# Patient Record
Sex: Male | Born: 1950 | Race: White | Hispanic: No | Smoking: Former smoker
Health system: Southern US, Community
[De-identification: ages and names within clinical notes are randomized; demographics above are authoritative.]

## PROBLEM LIST (undated history)

## (undated) DIAGNOSIS — I10 Essential (primary) hypertension: Secondary | ICD-10-CM

## (undated) DIAGNOSIS — E785 Hyperlipidemia, unspecified: Secondary | ICD-10-CM

## (undated) DIAGNOSIS — C801 Malignant (primary) neoplasm, unspecified: Secondary | ICD-10-CM

## (undated) HISTORY — PX: SKIN CANCER EXCISION: SHX779

## (undated) HISTORY — PX: OTHER SURGICAL HISTORY: SHX169

## (undated) HISTORY — DX: Hyperlipidemia, unspecified: E78.5

## (undated) HISTORY — DX: Essential (primary) hypertension: I10

## (undated) HISTORY — DX: Malignant (primary) neoplasm, unspecified: C80.1

---

## 2002-04-24 ENCOUNTER — Encounter: Admission: RE | Admit: 2002-04-24 | Discharge: 2002-07-23 | Payer: Self-pay | Admitting: Internal Medicine

## 2009-01-08 ENCOUNTER — Ambulatory Visit: Payer: Self-pay | Admitting: Internal Medicine

## 2009-04-18 ENCOUNTER — Ambulatory Visit: Payer: Self-pay | Admitting: Internal Medicine

## 2009-06-25 ENCOUNTER — Ambulatory Visit: Payer: Self-pay | Admitting: Internal Medicine

## 2009-06-27 ENCOUNTER — Ambulatory Visit: Payer: Self-pay | Admitting: Internal Medicine

## 2009-08-22 ENCOUNTER — Ambulatory Visit: Payer: Self-pay | Admitting: Internal Medicine

## 2009-11-07 ENCOUNTER — Ambulatory Visit: Payer: Self-pay | Admitting: Internal Medicine

## 2010-03-18 ENCOUNTER — Ambulatory Visit: Payer: Self-pay | Admitting: Internal Medicine

## 2010-04-29 ENCOUNTER — Ambulatory Visit: Payer: Self-pay | Admitting: Internal Medicine

## 2010-08-01 ENCOUNTER — Ambulatory Visit: Payer: Self-pay | Admitting: Internal Medicine

## 2011-03-19 ENCOUNTER — Ambulatory Visit (INDEPENDENT_AMBULATORY_CARE_PROVIDER_SITE_OTHER): Payer: PRIVATE HEALTH INSURANCE | Admitting: Internal Medicine

## 2011-03-19 DIAGNOSIS — E119 Type 2 diabetes mellitus without complications: Secondary | ICD-10-CM

## 2011-03-19 DIAGNOSIS — E785 Hyperlipidemia, unspecified: Secondary | ICD-10-CM

## 2011-06-19 ENCOUNTER — Telehealth: Payer: Self-pay | Admitting: Internal Medicine

## 2011-06-19 NOTE — Telephone Encounter (Signed)
Written Rx  For Xanax 0.5mg  #30 1-2 tabs 2 hours before trip no refill will be faxed to pharmacy

## 2011-09-10 ENCOUNTER — Other Ambulatory Visit: Payer: Self-pay | Admitting: Internal Medicine

## 2011-09-15 ENCOUNTER — Other Ambulatory Visit: Payer: Self-pay | Admitting: Internal Medicine

## 2011-11-17 ENCOUNTER — Other Ambulatory Visit: Payer: Self-pay | Admitting: Internal Medicine

## 2011-11-18 ENCOUNTER — Other Ambulatory Visit: Payer: Self-pay

## 2011-11-18 MED ORDER — ATORVASTATIN CALCIUM 20 MG PO TABS: 40.0000 mg | ORAL_TABLET | ORAL | Status: DC

## 2011-11-18 NOTE — Telephone Encounter (Signed)
Spoke with patient today about scheduling an appointment with Dr. Lenord Fellers, but he informed me that he just got fired from his job and has only enough insurance to cover 90 day supply of medications. Generic Lipitor 40 mg one po daily #90 sent to pharmacy

## 2015-08-12 ENCOUNTER — Ambulatory Visit (INDEPENDENT_AMBULATORY_CARE_PROVIDER_SITE_OTHER): Payer: No Typology Code available for payment source | Admitting: Internal Medicine

## 2015-08-12 ENCOUNTER — Encounter: Payer: Self-pay | Admitting: Internal Medicine

## 2015-08-12 VITALS — BP 118/58 | HR 80 | Temp 97.9°F | Ht 69.0 in | Wt 133.0 lb

## 2015-08-12 DIAGNOSIS — IMO0002 Reserved for concepts with insufficient information to code with codable children: Secondary | ICD-10-CM

## 2015-08-12 DIAGNOSIS — Z0000 Encounter for general adult medical examination without abnormal findings: Secondary | ICD-10-CM

## 2015-08-12 DIAGNOSIS — Z794 Long term (current) use of insulin: Secondary | ICD-10-CM

## 2015-08-12 DIAGNOSIS — E1165 Type 2 diabetes mellitus with hyperglycemia: Secondary | ICD-10-CM

## 2015-08-12 DIAGNOSIS — R011 Cardiac murmur, unspecified: Secondary | ICD-10-CM

## 2015-08-12 DIAGNOSIS — Z125 Encounter for screening for malignant neoplasm of prostate: Secondary | ICD-10-CM

## 2015-08-12 DIAGNOSIS — R110 Nausea: Secondary | ICD-10-CM

## 2015-08-12 DIAGNOSIS — E785 Hyperlipidemia, unspecified: Secondary | ICD-10-CM | POA: Diagnosis not present

## 2015-08-12 LAB — POCT URINALYSIS DIPSTICK
Bilirubin, UA: NEGATIVE
Blood, UA: NEGATIVE
LEUKOCYTES UA: NEGATIVE
Nitrite, UA: NEGATIVE
PH UA: 7
PROTEIN UA: NEGATIVE
SPEC GRAV UA: 1.02
UROBILINOGEN UA: NEGATIVE

## 2015-08-12 LAB — COMPLETE METABOLIC PANEL WITH GFR
ALT: 12 U/L (ref 9–46)
AST: 11 U/L (ref 10–35)
Albumin: 4 g/dL (ref 3.6–5.1)
Alkaline Phosphatase: 84 U/L (ref 40–115)
BUN: 15 mg/dL (ref 7–25)
CALCIUM: 9 mg/dL (ref 8.6–10.3)
CHLORIDE: 101 mmol/L (ref 98–110)
CO2: 21 mmol/L (ref 20–31)
CREATININE: 1.12 mg/dL (ref 0.70–1.25)
GFR, EST AFRICAN AMERICAN: 81 mL/min (ref >=60)
GFR, Est Non African American: 70 mL/min (ref >=60)
Glucose, Bld: 198 mg/dL — ABNORMAL HIGH (ref 65–99)
POTASSIUM: 4.1 mmol/L (ref 3.5–5.3)
Sodium: 138 mmol/L (ref 135–146)
Total Bilirubin: 0.4 mg/dL (ref 0.2–1.2)
Total Protein: 6.7 g/dL (ref 6.1–8.1)

## 2015-08-12 LAB — HEMOGLOBIN A1C
HEMOGLOBIN A1C: 12.4 % — AB (ref <5.7)
Mean Plasma Glucose: 309 mg/dL — ABNORMAL HIGH (ref <117)

## 2015-08-12 LAB — CBC WITH DIFFERENTIAL/PLATELET
BASOS PCT: 1 % (ref 0–1)
Basophils Absolute: 0.1 10*3/uL (ref 0.0–0.1)
EOS PCT: 3 % (ref 0–5)
Eosinophils Absolute: 0.3 10*3/uL (ref 0.0–0.7)
HCT: 43.5 % (ref 39.0–52.0)
Hemoglobin: 14.8 g/dL (ref 13.0–17.0)
Lymphocytes Relative: 31 % (ref 12–46)
Lymphs Abs: 2.8 10*3/uL (ref 0.7–4.0)
MCH: 29.9 pg (ref 26.0–34.0)
MCHC: 34 g/dL (ref 30.0–36.0)
MCV: 87.9 fL (ref 78.0–100.0)
MONO ABS: 0.5 10*3/uL (ref 0.1–1.0)
MPV: 11.8 fL (ref 8.6–12.4)
Monocytes Relative: 6 % (ref 3–12)
Neutro Abs: 5.3 10*3/uL (ref 1.7–7.7)
Neutrophils Relative %: 59 % (ref 43–77)
Platelets: 282 10*3/uL (ref 150–400)
RBC: 4.95 MIL/uL (ref 4.22–5.81)
RDW: 14 % (ref 11.5–15.5)
WBC: 8.9 10*3/uL (ref 4.0–10.5)

## 2015-08-12 NOTE — Patient Instructions (Addendum)
Need to have diabetic eye exam. Patient is to call insurance company and get reassigned under my name. Previously was assigned to [facility]. Needs evaluation of cardiac murmur with 2-D echocardiogram and chest x-ray which have to have prior authorization under his insurance.  Increase Levemir to 20 units at bedtime and NovoLog to 15 units before meals. He must eat 3 meals daily not just 2 meals daily. Return in 8 weeks. Will need fasting lipid panel at that time.

## 2015-08-13 ENCOUNTER — Telehealth: Payer: Self-pay | Admitting: *Deleted

## 2015-08-13 ENCOUNTER — Telehealth: Payer: Self-pay | Admitting: Internal Medicine

## 2015-08-13 ENCOUNTER — Encounter: Payer: Self-pay | Admitting: Internal Medicine

## 2015-08-13 LAB — MICROALBUMIN / CREATININE URINE RATIO
Creatinine, Urine: 213.4 mg/dL
MICROALB UR: 2.7 mg/dL — AB (ref <2.0)
Microalb Creat Ratio: 12.7 mg/g (ref 0.0–30.0)

## 2015-08-13 LAB — PSA: PSA: 0.28 ng/mL (ref <=4.00)

## 2015-08-13 NOTE — Telephone Encounter (Signed)
Spoke with patient reviewed lab results and Insulin dose increase patient verbalized understanding. Patient schedule for 8 week follow up with labs.

## 2015-08-13 NOTE — Telephone Encounter (Signed)
Spoke with Herbie Baltimore at [facility] [phone]); POS plan eff 12/21/2014.  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  Because patient lives in [place], they drive this plan and the search for his providers off of his address in [place].  Therefore, Dr. Renold Genta is NOT allowed to be his PCP of choice.  She can see him.  She is on the list of providers for [place].  However, she is NOT a tier 1 provider for this patient, she is a tier 2 provider for this patient.  Patient would have to pay Tier 2 benefits.  We are paid IN NETWORK per Herbie Baltimore, but patient has to pay Tier 2.  Patient has a $2200 OOP.  He would require authorization for x-rays and OP procedures that are done OUTSIDE of the office.  For PRE-CERT, call #[phone].  Patient was present for this phone call, so patient is aware of this information and has been informed that he can continue to see Dr. Renold Genta, but he will have to pay Tier 2 benefits.  Otherwise, he would have to select someone in the [place] or on the list of Tier 1 providers and Aurea Graff can send that list to the patient.  Patient wishes to continue to see Dr. Renold Genta.  Ref #[id number] (for this phone call).    Dr. Renold Genta wants a chest x-ray and echocardiogram ordered for patient.  Dx:  Systolic cardiac murmur.  We'll start with chest x-ray which will require pre-cert per my call to [facility] this evening.  We will begin to work on that on Tuesday.  Patient is to also have fasting Lipid panel at that time as well.

## 2015-08-13 NOTE — Telephone Encounter (Signed)
Called for authorization for chest x-ray; spoke with Carla Drape. @ [phone], option 1.  Per Thurman Coyer, no pre-cert for the plan per the NIA [facility]).  I advised that I was told by Kalman Drape) on 8/22) that his plan WOULD require pre-cert and given her # to call.  She again said per the NIA, no auth req'd.  She couldn't provide a reference # for the call even though I asked for it.  Advised for all other pre-cert's to call #[phone].  Told her that I didn't want the member to have issues with payment following the chest x-ray.

## 2015-08-15 ENCOUNTER — Other Ambulatory Visit: Payer: No Typology Code available for payment source | Admitting: Internal Medicine

## 2015-08-15 ENCOUNTER — Ambulatory Visit
Admission: RE | Admit: 2015-08-15 | Discharge: 2015-08-15 | Disposition: A | Discharge status: Home / Self Care | Payer: No Typology Code available for payment source | Source: Ambulatory Visit | Attending: Internal Medicine | Admitting: Internal Medicine

## 2015-08-15 DIAGNOSIS — R011 Cardiac murmur, unspecified: Secondary | ICD-10-CM

## 2015-08-15 DIAGNOSIS — Z13220 Encounter for screening for lipoid disorders: Secondary | ICD-10-CM

## 2015-08-15 LAB — LIPID PANEL
CHOLESTEROL: 256 mg/dL — AB (ref 125–200)
HDL: 52 mg/dL (ref >=40)
LDL Cholesterol: 180 mg/dL — ABNORMAL HIGH (ref <130)
Total CHOL/HDL Ratio: 4.9 Ratio (ref <=5.0)
Triglycerides: 120 mg/dL (ref <150)
VLDL: 24 mg/dL (ref <30)

## 2015-08-16 ENCOUNTER — Telehealth: Payer: Self-pay | Admitting: Internal Medicine

## 2015-08-16 NOTE — Telephone Encounter (Signed)
LMOM for patient to call us to go over his Lipid Panel results with him.  Leaving results on Suzanne's desk.  Dr. Renold Genta want's patient to start on Zocor 10mg  daily with supper.  Patient will need to provide a pharmacy for [place] to phone this medication in for him.

## 2015-08-19 ENCOUNTER — Encounter: Payer: Self-pay | Admitting: *Deleted

## 2015-08-19 MED ORDER — ROSUVASTATIN CALCIUM 10 MG PO TABS: 10.0000 mg | ORAL_TABLET | Freq: Every day | ORAL | Status: DC

## 2015-08-19 NOTE — Telephone Encounter (Signed)
Spoke to patient's wife they use [facility] . Called in Crestor 10mg  per Dr Renold Genta due to insurance coverage.
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
## 2015-09-23 ENCOUNTER — Telehealth: Payer: Self-pay | Admitting: *Deleted

## 2015-09-23 DIAGNOSIS — R011 Cardiac murmur, unspecified: Secondary | ICD-10-CM

## 2015-09-25 NOTE — Telephone Encounter (Signed)
Patient has been scheduled for a 2D Echo at  [place] location on Oct 18,2016 @10 :30 am. Left information on patient voice mail as directed by patient.

## 2015-10-01 ENCOUNTER — Ambulatory Visit (INDEPENDENT_AMBULATORY_CARE_PROVIDER_SITE_OTHER): Payer: No Typology Code available for payment source | Admitting: Internal Medicine

## 2015-10-01 ENCOUNTER — Encounter: Payer: Self-pay | Admitting: Internal Medicine

## 2015-10-01 VITALS — BP 106/68 | HR 87 | Temp 98.2°F | Ht 69.0 in | Wt 141.0 lb

## 2015-10-01 DIAGNOSIS — E119 Type 2 diabetes mellitus without complications: Secondary | ICD-10-CM

## 2015-10-01 DIAGNOSIS — Z23 Encounter for immunization: Secondary | ICD-10-CM | POA: Diagnosis not present

## 2015-10-01 DIAGNOSIS — K047 Periapical abscess without sinus: Secondary | ICD-10-CM | POA: Diagnosis not present

## 2015-10-01 DIAGNOSIS — IMO0001 Reserved for inherently not codable concepts without codable children: Secondary | ICD-10-CM | POA: Insufficient documentation

## 2015-10-01 DIAGNOSIS — Z794 Long term (current) use of insulin: Secondary | ICD-10-CM | POA: Diagnosis not present

## 2015-10-01 DIAGNOSIS — E785 Hyperlipidemia, unspecified: Secondary | ICD-10-CM | POA: Insufficient documentation

## 2015-10-01 LAB — LIPID PANEL
CHOL/HDL RATIO: 2.9 ratio (ref <=5.0)
CHOLESTEROL: 155 mg/dL (ref 125–200)
HDL: 53 mg/dL (ref >=40)
LDL Cholesterol: 76 mg/dL (ref <130)
Triglycerides: 129 mg/dL (ref <150)
VLDL: 26 mg/dL (ref <30)

## 2015-10-01 LAB — GLUCOSE, POCT (MANUAL RESULT ENTRY): POC GLUCOSE: 245 mg/dL — AB (ref 70–99)

## 2015-10-01 LAB — HEMOGLOBIN A1C
Hgb A1c MFr Bld: 9.9 % — ABNORMAL HIGH (ref <5.7)
MEAN PLASMA GLUCOSE: 237 mg/dL — AB (ref <117)

## 2015-10-01 MED ORDER — GLUCOSE BLOOD VI STRP: ORAL_STRIP | Status: DC

## 2015-10-01 MED ORDER — INSULIN DETEMIR 100 UNIT/ML FLEXPEN: 20.0000 [IU] | PEN_INJECTOR | Freq: Every day | SUBCUTANEOUS | Status: DC

## 2015-10-01 MED ORDER — INSULIN ASPART 100 UNIT/ML FLEXPEN: 15.0000 [IU] | PEN_INJECTOR | Freq: Three times a day (TID) | SUBCUTANEOUS | Status: DC

## 2015-10-01 MED ORDER — ACCU-CHEK SOFTCLIX LANCETS MISC: Status: DC

## 2015-10-01 MED ORDER — ONETOUCH ULTRA SYSTEM W/DEVICE KIT: 1.0000 | PACK | Freq: Once | Status: DC

## 2015-10-01 MED ORDER — PENICILLIN V POTASSIUM 500 MG PO TABS: 500.0000 mg | ORAL_TABLET | Freq: Four times a day (QID) | ORAL | Status: DC

## 2015-10-01 MED ORDER — ONETOUCH ULTRASOFT LANCETS MISC: Status: DC

## 2015-10-01 NOTE — Progress Notes (Signed)
   Subjective:    Patient ID: Joseph Dixon, male    DOB: 06-17-1951, 64 y.o.   MRN: [id number]  HPI 64 year old White Male for follow up of DM and hyperlipidemia. Had some swelling in his left jaw recently. Suspect it was a dental infection. He says it resolved. However I prefer that he  take some antibiotics. Says he needs to have some dental work done but that dentist has been concerned about his health status and may need a note. Prescribed Pen-Vee K for 10 days. He continues to have issues understanding his health insurance coverage. Says recent dermatology appointment was not covered. Our staff is explained to him some of the 2 years that are not covered for his insurance plan. He needs to get diabetic test strips. He has not been checking his Accu-Cheks on a regular basis although he does have a meter. Hemoglobin A1c at last visit was 12.4%. He is now on Crestor 5 mg daily and that is costing him $17 a month. Lipid panel and liver functions are pending. At last visit, he was not a lipid-lowering medication. Have talked with him repeatedly about the importance of eating correctly and on time. He's been getting insulin elsewhere besides a drugstore. I did give him a NovoLog flex pen sample today. His Accu-Chek in the office before lunch is 245.  Tetanus immunization update given. Flu vaccine given.    Review of Systems     Objective:   Physical Exam  Not examined.       Assessment & Plan:  Insulin-dependent diabetes mellitus-hemoglobin A1c drawn and pending.  Dental infection left mandible. Treat with Pen-Vee K for 10 days  Hyperlipidemia-on Crestor 5 mg daily generic and results are pending  Plan: Return in 3 months for office visit, hemoglobin A1c.

## 2015-10-01 NOTE — Patient Instructions (Signed)
Purchase diabetic test strips and check Accu-Cheks at least twice daily. Take Crestor. Flu and tetanus vaccines given today. Return in 3 months. Try  to eat regularly and watch diet. Take Pen-Vee K for dental infection.

## 2015-10-02 ENCOUNTER — Telehealth: Payer: Self-pay | Admitting: *Deleted

## 2015-10-02 NOTE — Telephone Encounter (Signed)
Left message on patient voice mail with recent lab results and instructions for patient

## 2015-10-03 ENCOUNTER — Other Ambulatory Visit (HOSPITAL_COMMUNITY): Payer: No Typology Code available for payment source

## 2015-10-08 ENCOUNTER — Other Ambulatory Visit: Payer: Self-pay

## 2015-10-08 ENCOUNTER — Telehealth: Payer: Self-pay

## 2015-10-08 ENCOUNTER — Ambulatory Visit (HOSPITAL_COMMUNITY): Payer: No Typology Code available for payment source | Attending: Internal Medicine

## 2015-10-08 DIAGNOSIS — I5189 Other ill-defined heart diseases: Secondary | ICD-10-CM | POA: Diagnosis not present

## 2015-10-08 DIAGNOSIS — I059 Rheumatic mitral valve disease, unspecified: Secondary | ICD-10-CM | POA: Insufficient documentation

## 2015-10-08 DIAGNOSIS — R011 Cardiac murmur, unspecified: Secondary | ICD-10-CM | POA: Insufficient documentation

## 2015-10-08 DIAGNOSIS — I351 Nonrheumatic aortic (valve) insufficiency: Secondary | ICD-10-CM | POA: Insufficient documentation

## 2015-10-08 DIAGNOSIS — E785 Hyperlipidemia, unspecified: Secondary | ICD-10-CM | POA: Diagnosis not present

## 2015-10-08 DIAGNOSIS — E119 Type 2 diabetes mellitus without complications: Secondary | ICD-10-CM | POA: Insufficient documentation

## 2015-10-08 DIAGNOSIS — I517 Cardiomegaly: Secondary | ICD-10-CM | POA: Insufficient documentation

## 2015-10-08 DIAGNOSIS — I358 Other nonrheumatic aortic valve disorders: Secondary | ICD-10-CM | POA: Insufficient documentation

## 2015-10-08 NOTE — Telephone Encounter (Signed)
Left message for patient to call office regarding echo results and recommendations.

## 2015-10-08 NOTE — Telephone Encounter (Signed)
-----   Message from Elby Showers, MD sent at 10/08/2015  4:45 PM EDT ----- Please call patient. He has mild to moderate aortic insufficiency.  He may need antibiotics before any dental work because of this heart murmur. Otherwise, we will just watch murmur for now.

## 2015-10-09 ENCOUNTER — Telehealth: Payer: Self-pay

## 2015-10-09 NOTE — Telephone Encounter (Signed)
Patient informed of ECHO results and recommendations.

## 2015-11-20 ENCOUNTER — Telehealth: Payer: Self-pay

## 2015-11-20 NOTE — Telephone Encounter (Signed)
[facility] nov[facility]. PA started via [email or web address] key ID# [id number] outcome will be faxed within 48-72 hours

## 2015-11-21 ENCOUNTER — Telehealth: Payer: Self-pay

## 2015-11-21 MED ORDER — INSULIN LISPRO 100 UNIT/ML (KWIKPEN): 15.0000 [IU] | PEN_INJECTOR | Freq: Three times a day (TID) | SUBCUTANEOUS | Status: DC

## 2015-11-21 NOTE — Telephone Encounter (Signed)
PA was obtained through [place] and was denied. He must try and fail humalog first. After discussing with Dr. Renold Genta, patient has been made aware that we are switching it.

## 2015-12-31 ENCOUNTER — Ambulatory Visit (INDEPENDENT_AMBULATORY_CARE_PROVIDER_SITE_OTHER): Payer: BLUE CROSS/BLUE SHIELD | Admitting: Internal Medicine

## 2015-12-31 ENCOUNTER — Encounter: Payer: Self-pay | Admitting: Internal Medicine

## 2015-12-31 VITALS — BP 122/70 | HR 79 | Temp 98.5°F | Resp 20 | Ht 69.0 in | Wt 151.0 lb

## 2015-12-31 DIAGNOSIS — E785 Hyperlipidemia, unspecified: Secondary | ICD-10-CM

## 2015-12-31 DIAGNOSIS — E118 Type 2 diabetes mellitus with unspecified complications: Secondary | ICD-10-CM

## 2015-12-31 DIAGNOSIS — Z794 Long term (current) use of insulin: Secondary | ICD-10-CM

## 2015-12-31 DIAGNOSIS — Z79899 Other long term (current) drug therapy: Secondary | ICD-10-CM | POA: Diagnosis not present

## 2015-12-31 DIAGNOSIS — I5189 Other ill-defined heart diseases: Secondary | ICD-10-CM | POA: Insufficient documentation

## 2015-12-31 DIAGNOSIS — I519 Heart disease, unspecified: Secondary | ICD-10-CM

## 2015-12-31 DIAGNOSIS — G4700 Insomnia, unspecified: Secondary | ICD-10-CM | POA: Diagnosis not present

## 2015-12-31 DIAGNOSIS — I351 Nonrheumatic aortic (valve) insufficiency: Secondary | ICD-10-CM | POA: Insufficient documentation

## 2015-12-31 LAB — HEPATIC FUNCTION PANEL
ALT: 17 U/L (ref 9–46)
AST: 13 U/L (ref 10–35)
Albumin: 4.3 g/dL (ref 3.6–5.1)
Alkaline Phosphatase: 59 U/L (ref 40–115)
Total Bilirubin: 0.5 mg/dL (ref 0.2–1.2)
Total Protein: 6.7 g/dL (ref 6.1–8.1)

## 2015-12-31 LAB — LIPID PANEL
CHOL/HDL RATIO: 3.6 ratio (ref <=5.0)
CHOLESTEROL: 175 mg/dL (ref 125–200)
HDL: 49 mg/dL (ref >=40)
LDL Cholesterol: 97 mg/dL (ref <130)
Triglycerides: 144 mg/dL (ref <150)
VLDL: 29 mg/dL (ref <30)

## 2015-12-31 MED ORDER — LOSARTAN POTASSIUM 25 MG PO TABS: 25.0000 mg | ORAL_TABLET | Freq: Every day | ORAL | Status: DC

## 2015-12-31 MED ORDER — ALPRAZOLAM 0.25 MG PO TABS: ORAL_TABLET | ORAL | Status: DC

## 2015-12-31 MED ORDER — INSULIN DETEMIR 100 UNIT/ML FLEXPEN: PEN_INJECTOR | SUBCUTANEOUS | Status: DC

## 2015-12-31 NOTE — Patient Instructions (Addendum)
Add Losartan 25 mg daily for renal protection. Labs pending. RTC in 4 months. Increase Levimir to 25 units at bedtime.

## 2015-12-31 NOTE — Progress Notes (Signed)
   Subjective:    Patient ID: Joseph Dixon, male    DOB: 29-Jul-1951, 65 y.o.   MRN: [id number]  HPI Patient is here today to follow-up on insulin-dependent diabetes and hyperlipidemia. He had recent eye exam in December by optometrist. Was told he had cataracts but did not need surgery. He brings in multiple Accu-Chek readings from December until nail. Many times his Accu-Chek is high in the mornings sometimes as high as 283 or 295. He says it has a lot to do with the amount of exercise he does the day before. It is out working in the yard it tends to be lower. Sometimes a.m. glucose is his low was 141. 5 PM glucoses generally pretty good between 150 and 160 but sometimes as high as 232. One time he had an episode of hypoglycemia at 68 at 7:32 PM on Friday, January 6. The next morning his Accu-Chek at 6 AM was 260. He remains on medication for hyperlipidemia. He's having some issues sleeping. Says he is worried about settling his mother's estate. Wakes up sometime and cannot go back to sleep. Declined flu vaccine.  We are adding losartan 25 mg daily for renal protection. He doesn't have hypertension.  With regard to recent 2-D echocardiogram to evaluate heart murmur, he has mild to moderate aortic insufficiency and calcified leaflets. He has diastolic dysfunction. ARB should help diastolic dysfunction. He denies chest pain. Says sometimes he feels a bit short of breath while resting.    Review of Systems as above     Objective:   Physical Exam  Skin warm and dry. Nodes none. Neck is supple without JVD thyromegaly or carotid bruits. Chest clear to auscultation without rales or wheezing. Cardiac exam murmur unchanged regular rate and rhythm. Extremities without edema. Diabetic foot exam performed.      Assessment & Plan:  Insulin-dependent diabetes-increase Levemir to 25 units at bedtime. Hemoglobin A1c pending  Hyperlipidemia-fasting lipid panel and liver functions drawn today   Diastolic  dysfunction  Aortic insufficiency  Insomnia-see below  Plan: Patient will take Xanax 0.25 mg sparingly at night when he is having difficulty sleeping. Will add are for renal protection with diabetes, losartan 25 mg daily. He will increase Levemir to 25 units at bedtime. Return in 4 months

## 2016-01-01 LAB — HEMOGLOBIN A1C
HEMOGLOBIN A1C: 9.3 % — AB (ref <5.7)
Mean Plasma Glucose: 220 mg/dL — ABNORMAL HIGH (ref <117)

## 2016-02-18 ENCOUNTER — Telehealth: Payer: Self-pay

## 2016-02-18 NOTE — Telephone Encounter (Signed)
Spoke with daughter directly by phone. She wants me to call in Phenergan for him. Says he did not pick up Zofran at pharmacy that was prescribed at ED. He received 2 bags of IVF at [facility] today.Told her she needed to  contact ED for further assistance or bring him to [facility] ED for evaluation. I am not comfortable calling in Rx when I have not evaluated him. He may need to be admitted, he has insulin dependent diabetes.

## 2016-02-18 NOTE — Telephone Encounter (Signed)
Patient's daughter contacted office stating that Joseph Dixon is very sick. He has been vomiting and diarrhea for 4 days. He went to [facility] today and they gave him 2 bags of fluid. She states that they gave him 8mg  zofran which is not helping him. She is requesting phenergan tabs or suppositories. She states that he has no fever today but he did yesterday. She reports sugar was high-262.

## 2016-02-24 ENCOUNTER — Other Ambulatory Visit: Payer: Self-pay

## 2016-02-24 MED ORDER — BLOOD GLUCOSE MONITOR KIT: PACK | Status: DC

## 2016-03-16 ENCOUNTER — Other Ambulatory Visit: Payer: Self-pay

## 2016-03-16 MED ORDER — BLOOD GLUCOSE MONITOR KIT: PACK | Status: AC

## 2016-03-16 MED ORDER — BLOOD GLUCOSE MONITOR KIT: PACK | Status: DC

## 2016-03-24 ENCOUNTER — Other Ambulatory Visit: Payer: Self-pay

## 2016-04-21 ENCOUNTER — Telehealth: Payer: Self-pay | Admitting: Internal Medicine

## 2016-04-21 MED ORDER — PROMETHAZINE HCL 25 MG PO TABS: 25.0000 mg | ORAL_TABLET | Freq: Three times a day (TID) | ORAL | Status: DC | PRN

## 2016-04-21 NOTE — Telephone Encounter (Signed)
Can call in Phenergan 25 mg # 20 one po q 4 hours prn nausea. I am concerned because daughter said he had Norovirus not long ago.

## 2016-04-21 NOTE — Telephone Encounter (Signed)
Patient calling because he has been sick with Norovirus since Sunday evening.  Wants to know if we will give him something to help with the nausea.  He's been keeping himself hydrated well.  Feels like he's over the worst of it at this point, he's just quite nauseous.  Same thing he has a few months ago.  Asked him how his sugar readings have been while he has been sick; states he doesn't have any monitor strips since his insurance won't pay for them, so he can't tell me.  He's not testing his sugar at all.    States he feels awful!!    [facility] [address]

## 2016-04-28 ENCOUNTER — Other Ambulatory Visit: Payer: BLUE CROSS/BLUE SHIELD | Admitting: Internal Medicine

## 2016-04-30 ENCOUNTER — Encounter: Payer: Self-pay | Admitting: Internal Medicine

## 2016-04-30 ENCOUNTER — Other Ambulatory Visit: Payer: Self-pay | Admitting: Internal Medicine

## 2016-04-30 ENCOUNTER — Ambulatory Visit (INDEPENDENT_AMBULATORY_CARE_PROVIDER_SITE_OTHER): Payer: BLUE CROSS/BLUE SHIELD | Admitting: Internal Medicine

## 2016-04-30 ENCOUNTER — Telehealth: Payer: Self-pay

## 2016-04-30 VITALS — BP 124/64 | HR 95 | Temp 97.6°F | Resp 18 | Wt 145.0 lb

## 2016-04-30 DIAGNOSIS — E785 Hyperlipidemia, unspecified: Secondary | ICD-10-CM

## 2016-04-30 DIAGNOSIS — Z794 Long term (current) use of insulin: Secondary | ICD-10-CM | POA: Diagnosis not present

## 2016-04-30 DIAGNOSIS — I351 Nonrheumatic aortic (valve) insufficiency: Secondary | ICD-10-CM | POA: Diagnosis not present

## 2016-04-30 DIAGNOSIS — E1142 Type 2 diabetes mellitus with diabetic polyneuropathy: Secondary | ICD-10-CM

## 2016-04-30 DIAGNOSIS — E119 Type 2 diabetes mellitus without complications: Secondary | ICD-10-CM | POA: Diagnosis not present

## 2016-04-30 LAB — HEMOGLOBIN A1C
HEMOGLOBIN A1C: 8.4 % — AB (ref <5.7)
Mean Plasma Glucose: 194 mg/dL

## 2016-04-30 NOTE — Progress Notes (Signed)
   Subjective:    Patient ID: Joseph Dixon, male    DOB: [birth year], 65 y.o.   MRN: [id number]  HPI Patient in today to follow-up on hyperlipidemia and diabetes mellitus which is insulin-dependent. He hasn't taken his blood glucose in the past month. He says there was an issue with insurance company not paying for them. My office staff spent 45 minutes on the phone trying to figure this out for him a few weeks back. Apparently they will not pay for the well-known brands of test strips but want him to purchase an over-the-counter brand with an over-the-counter glucometer. He's been advised of this twice now. Says he recently had a bout of gastroenteritis with nausea and vomiting. He had a similar episode that was more severe in February. I'm not sure why he's had 2 episodes so close together. He denies abdominal pain at this point in time. Says he may of had some fever with the most recent bout for which we called in Phenergan. He brings in several months worth of Accu-Cheks dating back to January. There is considerable fluctuation with readings. Hemoglobin A1c improved at 8.4%   Review of Systems see above     Objective:   Physical Exam  Skin warm and dry. Nodes none. Chest clear. Cardiac exam regular rate and rhythm. Murmur unchanged. Extremities without edema. Says he has some numbness toes and distal foot. Extremities without edema.      Assessment & Plan:  Insulin-dependent diabetes mellitus  Hyperlipidemia  Status post bout of viral gastroenteritis  Diabetic peripheral neuropathy  Aortic insufficiency    Plan: Patient needs to purchase over-the-counter blood glucose monitoring system and watch his glucose. Return in September

## 2016-05-01 LAB — HEPATITIS C ANTIBODY: HCV Ab: NEGATIVE

## 2016-05-01 LAB — HIV ANTIBODY (ROUTINE TESTING W REFLEX): HIV 1&2 Ab, 4th Generation: NONREACTIVE

## 2016-05-01 LAB — VITAMIN B12: VITAMIN B 12: 528 pg/mL (ref 200–1100)

## 2016-05-01 NOTE — Telephone Encounter (Signed)
errogenous

## 2016-05-04 ENCOUNTER — Telehealth: Payer: Self-pay | Admitting: *Deleted

## 2016-05-04 MED ORDER — ALPRAZOLAM 0.25 MG PO TABS: ORAL_TABLET | ORAL | Status: DC

## 2016-05-04 MED ORDER — ROSUVASTATIN CALCIUM 10 MG PO TABS: 10.0000 mg | ORAL_TABLET | Freq: Every day | ORAL | Status: DC

## 2016-05-04 MED ORDER — LOSARTAN POTASSIUM 25 MG PO TABS: 25.0000 mg | ORAL_TABLET | Freq: Every day | ORAL | Status: DC

## 2016-05-04 NOTE — Telephone Encounter (Signed)
Per Dr. Renold Genta she wants me to phone in 1 month supply of xanax, and crestor sent in electronically

## 2016-05-17 ENCOUNTER — Other Ambulatory Visit: Payer: Self-pay | Admitting: Internal Medicine

## 2016-05-20 ENCOUNTER — Encounter: Payer: Self-pay | Admitting: Internal Medicine

## 2016-06-15 ENCOUNTER — Other Ambulatory Visit: Payer: Self-pay | Admitting: Internal Medicine

## 2016-06-15 NOTE — Telephone Encounter (Signed)
Please check and see when last refill was. How often is pt. Taking?

## 2016-06-16 NOTE — Telephone Encounter (Signed)
Last filled on 5/15 and picked up on 5/16. Patient takes it almost every night for sleep.

## 2016-06-17 NOTE — Telephone Encounter (Signed)
Phoned to pharmacy 

## 2016-06-29 ENCOUNTER — Other Ambulatory Visit: Payer: Self-pay | Admitting: Internal Medicine

## 2016-06-29 NOTE — Telephone Encounter (Signed)
Refill x 6 months 

## 2016-07-01 ENCOUNTER — Telehealth: Payer: Self-pay

## 2016-07-01 MED ORDER — ALPRAZOLAM 0.5 MG PO TBDP: 0.5000 mg | ORAL_TABLET | Freq: Two times a day (BID) | ORAL | Status: DC | PRN

## 2016-07-01 NOTE — Telephone Encounter (Signed)
Patient contacted office stating that he is leaving for [place]. He states that he would like something to prevent a panic attack that he is prone to getting when in enclosed small spaces. He states that he will be there for a good month atleast flying around to multiple locations. When asked how many flights he would be taking, his response was why does that matter. I explained to him that it was important to know that so if approved we knew what he needed. He said he has not booked his flights but he states a minimum of 10. Please advise as he states that alprazolam 0.25mg  is not sufficient.

## 2016-07-01 NOTE — Telephone Encounter (Signed)
He may have 60  Alprazolam 0.5 mg with NO refills one po bid prn anxiety

## 2016-08-11 ENCOUNTER — Telehealth: Payer: Self-pay | Admitting: Internal Medicine

## 2016-08-11 NOTE — Telephone Encounter (Signed)
Refill once 

## 2016-08-11 NOTE — Telephone Encounter (Signed)
Calling to request refill on his Xanax 0.25 mg prn sleep.  Take 1 tablet at bedtime.    [facility] [address]

## 2016-08-13 MED ORDER — ALPRAZOLAM 0.5 MG PO TBDP: 0.5000 mg | ORAL_TABLET | Freq: Two times a day (BID) | ORAL | 0 refills | Status: DC | PRN

## 2016-08-21 ENCOUNTER — Telehealth: Payer: Self-pay

## 2016-08-21 ENCOUNTER — Encounter: Payer: Self-pay | Admitting: Internal Medicine

## 2016-08-21 ENCOUNTER — Ambulatory Visit (INDEPENDENT_AMBULATORY_CARE_PROVIDER_SITE_OTHER): Payer: BLUE CROSS/BLUE SHIELD | Admitting: Internal Medicine

## 2016-08-21 VITALS — BP 128/70 | HR 89 | Temp 97.7°F | Ht 69.0 in | Wt 136.0 lb

## 2016-08-21 DIAGNOSIS — E1143 Type 2 diabetes mellitus with diabetic autonomic (poly)neuropathy: Secondary | ICD-10-CM | POA: Diagnosis not present

## 2016-08-21 DIAGNOSIS — K921 Melena: Secondary | ICD-10-CM

## 2016-08-21 DIAGNOSIS — E785 Hyperlipidemia, unspecified: Secondary | ICD-10-CM

## 2016-08-21 DIAGNOSIS — Z0000 Encounter for general adult medical examination without abnormal findings: Secondary | ICD-10-CM | POA: Diagnosis not present

## 2016-08-21 DIAGNOSIS — K3184 Gastroparesis: Secondary | ICD-10-CM | POA: Diagnosis not present

## 2016-08-21 DIAGNOSIS — R112 Nausea with vomiting, unspecified: Secondary | ICD-10-CM | POA: Diagnosis not present

## 2016-08-21 LAB — POCT URINALYSIS DIPSTICK
Blood, UA: NEGATIVE
GLUCOSE UA: POSITIVE
LEUKOCYTES UA: NEGATIVE
NITRITE UA: NEGATIVE
Spec Grav, UA: 1.015
Urobilinogen, UA: 0.2
pH, UA: 6

## 2016-08-21 LAB — CBC WITH DIFFERENTIAL/PLATELET
BASOS ABS: 87 {cells}/uL (ref 0–200)
BASOS PCT: 1 %
EOS ABS: 87 {cells}/uL (ref 15–500)
Eosinophils Relative: 1 %
HCT: 47.4 % (ref 38.5–50.0)
HEMOGLOBIN: 16.8 g/dL (ref 13.2–17.1)
LYMPHS ABS: 2958 {cells}/uL (ref 850–3900)
Lymphocytes Relative: 34 %
MCH: 30.8 pg (ref 27.0–33.0)
MCHC: 35.4 g/dL (ref 32.0–36.0)
MCV: 87 fL (ref 80.0–100.0)
MPV: 10.8 fL (ref 7.5–12.5)
Monocytes Absolute: 522 cells/uL (ref 200–950)
Monocytes Relative: 6 %
NEUTROS ABS: 5046 {cells}/uL (ref 1500–7800)
Neutrophils Relative %: 58 %
Platelets: 274 10*3/uL (ref 140–400)
RBC: 5.45 MIL/uL (ref 4.20–5.80)
RDW: 13 % (ref 11.0–15.0)
WBC: 8.7 10*3/uL (ref 3.8–10.8)

## 2016-08-21 LAB — LIPID PANEL
CHOL/HDL RATIO: 2.8 ratio (ref <=5.0)
CHOLESTEROL: 157 mg/dL (ref 125–200)
HDL: 57 mg/dL (ref >=40)
LDL Cholesterol: 70 mg/dL (ref <130)
Triglycerides: 150 mg/dL — ABNORMAL HIGH (ref <150)
VLDL: 30 mg/dL (ref <30)

## 2016-08-21 LAB — COMPLETE METABOLIC PANEL WITH GFR
ALBUMIN: 4.3 g/dL (ref 3.6–5.1)
ALK PHOS: 62 U/L (ref 40–115)
ALT: 14 U/L (ref 9–46)
AST: 14 U/L (ref 10–35)
BILIRUBIN TOTAL: 0.4 mg/dL (ref 0.2–1.2)
BUN: 16 mg/dL (ref 7–25)
CO2: 28 mmol/L (ref 20–31)
CREATININE: 0.89 mg/dL (ref 0.70–1.25)
Calcium: 9.5 mg/dL (ref 8.6–10.3)
Chloride: 96 mmol/L — ABNORMAL LOW (ref 98–110)
GLUCOSE: 119 mg/dL — AB (ref 65–99)
Potassium: 3.5 mmol/L (ref 3.5–5.3)
SODIUM: 135 mmol/L (ref 135–146)
TOTAL PROTEIN: 6.8 g/dL (ref 6.1–8.1)

## 2016-08-21 LAB — URINALYSIS, MICROSCOPIC ONLY
Bacteria, UA: NONE SEEN [HPF]
Casts: NONE SEEN [LPF]
Crystals: NONE SEEN [HPF]
Yeast: NONE SEEN [HPF]

## 2016-08-21 LAB — HEMOCCULT GUIAC POC 1CARD (OFFICE): Fecal Occult Blood, POC: NEGATIVE

## 2016-08-21 LAB — LIPASE: LIPASE: 5 U/L — AB (ref 7–60)

## 2016-08-21 LAB — GLUCOSE, POCT (MANUAL RESULT ENTRY): POC Glucose: 183 mg/dl — AB (ref 70–99)

## 2016-08-21 MED ORDER — PANTOPRAZOLE SODIUM 40 MG PO TBEC: 40.0000 mg | DELAYED_RELEASE_TABLET | Freq: Every day | ORAL | 2 refills | Status: DC

## 2016-08-21 NOTE — Progress Notes (Signed)
   Subjective:    Patient ID: Joseph Dixon, male    DOB: August 13, 1951, 65 y.o.   MRN: [id number]  HPI One week ago onset of nausea and vomiting. No diarrhea. At least 2-3 episodes a day up to 8-10 times a day. He is  an insulin dependent diabetic. accuchecks have been between 100-200 and only once was 300.  Has been to [place] this past month. Got back mid August.  Last week before he got sick, he consumes some alcohol brought to him by friends. Says he had about 5 shots of liquor.  Has had 4 episodes of recurrent N and V since February.  He is not orthostatic today blood pressure is 120/80 lying and 120/80 standing.  He has 3+ glucose in his urine. His urine is orange colored but clear.  Was seen at [facility] in February with similar illness. At that time he also had diarrhea. He was thought to have nor ovarian. He received IV fluids. CT scan of the abdomen was unremarkable. Pancreas was normal and there was no small bowel obstruction.    Review of Systems as above     Objective:   Physical Exam Skin warm and dry. Nodes none. Neck is supple without JVD thyromegaly or carotid bruits. Chest clear. Cardiac exam regular rate and rhythm normal S1 and S2. Abdomen bowel sounds are active. Abdomen is nondistended soft without hepatosplenomegaly masses or tenderness       Assessment & Plan:  Recurrent emesis and nausea with negative workup at [facility] in February  ? Diabetic gastroparesis  Plan: Trial of Reglan 10 mg one half hour before meals

## 2016-08-21 NOTE — Telephone Encounter (Signed)
Called patient and advised there was a problem with STAT pick up labs. Advised per Dr. Renold Genta he will be called with results later. Patient verbalized understanding.

## 2016-08-21 NOTE — Patient Instructions (Addendum)
Continue to rehydrate yourself orally. Lab work pending. Protonix 40 mg daily. Further instructions to follow once lab work has been reviewed. Trial of Reglan 10 mg one half hour before meals

## 2016-08-22 ENCOUNTER — Other Ambulatory Visit: Payer: Self-pay | Admitting: Internal Medicine

## 2016-08-22 LAB — HEMOGLOBIN A1C
Hgb A1c MFr Bld: 8.1 % — ABNORMAL HIGH (ref <5.7)
Mean Plasma Glucose: 186 mg/dL

## 2016-08-22 MED ORDER — METOCLOPRAMIDE HCL 5 MG PO TABS: 5.0000 mg | ORAL_TABLET | Freq: Three times a day (TID) | ORAL | 0 refills | Status: DC

## 2016-08-26 LAB — ACETONE

## 2016-08-27 ENCOUNTER — Ambulatory Visit: Payer: BLUE CROSS/BLUE SHIELD | Admitting: Internal Medicine

## 2016-09-15 ENCOUNTER — Encounter: Payer: Self-pay | Admitting: Internal Medicine

## 2016-09-15 ENCOUNTER — Ambulatory Visit (INDEPENDENT_AMBULATORY_CARE_PROVIDER_SITE_OTHER): Payer: BLUE CROSS/BLUE SHIELD | Admitting: Internal Medicine

## 2016-09-15 VITALS — BP 140/76 | HR 92 | Temp 98.4°F | Wt 151.0 lb

## 2016-09-15 DIAGNOSIS — Z23 Encounter for immunization: Secondary | ICD-10-CM

## 2016-09-15 DIAGNOSIS — E119 Type 2 diabetes mellitus without complications: Secondary | ICD-10-CM

## 2016-09-15 DIAGNOSIS — L97511 Non-pressure chronic ulcer of other part of right foot limited to breakdown of skin: Secondary | ICD-10-CM | POA: Diagnosis not present

## 2016-09-15 DIAGNOSIS — Z794 Long term (current) use of insulin: Secondary | ICD-10-CM | POA: Diagnosis not present

## 2016-09-15 DIAGNOSIS — N529 Male erectile dysfunction, unspecified: Secondary | ICD-10-CM | POA: Diagnosis not present

## 2016-09-15 MED ORDER — MUPIROCIN 2 % EX OINT: TOPICAL_OINTMENT | CUTANEOUS | 0 refills | Status: DC

## 2016-09-15 MED ORDER — DOXYCYCLINE HYCLATE 100 MG PO TABS: 100.0000 mg | ORAL_TABLET | Freq: Two times a day (BID) | ORAL | 0 refills | Status: DC

## 2016-09-15 NOTE — Progress Notes (Signed)
   Subjective:    Patient ID: Joseph Dixon, male    DOB: 1950/12/31, 65 y.o.   MRN: [id number]  HPI  Right great toe superficial ulceration dorsal aspect DIP joint. No evidence of secondary infection. tetanus immunization up to date. No fever or chills.  He says ulceration is been present for 3 days. Denies injury. He is an insulin-dependent diabetic. Also has hyperlipidemia. Last hemoglobin A1c  Last Hgb AIC was done August 21, 2016 was 8.1% and previously had been 8.4% 4 months previously.  He is concerned he may be getting a toe infection and is worried because he is a diabetic.  He also asked about medication for erectile dysfunction. He needs to call insurance company and speak with his pharmacist about which preparation will be covered.     Review of Systems as above      Objective:   Physical Exam  Superficial toe ulceration right great toe dorsal aspect without significant drainage.      Assessment & Plan:  Insulin dependent diabetes  Superficial toe ulcer  Erectile dysfunction     Plan: Bactroban ointment applied to toe ulcer twice daily until healed. Keep toe dressed lightly. Doxycycline 100 mg twice daily for 10 days.Flu vaccine given. Call if not better in 7-10 days or sooner if worse. Consult pharmacy and insurance come in about medication for erectile dysfunction.

## 2016-09-15 NOTE — Patient Instructions (Signed)
Apply Bactroban ointment twice daily to toe ulcer after soaking in warm soapy water. Then dressed lightly with gauze. Call if not better in 7-10 days or sooner if worse. Flu vaccine given. Speak to insurance company and pharmacist about erectile dysfunction medication before we prescribed. Doxycycline 100 mg twice daily for 10 days.

## 2016-09-17 ENCOUNTER — Telehealth: Payer: Self-pay | Admitting: Internal Medicine

## 2016-09-17 NOTE — Telephone Encounter (Signed)
Patient spoke with his pharmacy; his copay for Viagra will be $20 for 4 pills/month.  He would like a Rx for this please sent to his pharmacy.  States that this is cheaper than Cialis, so he'd like to try Viagra.  Also sent a savings card to patient in the mail.  Advised this may or may not work with his insurance.

## 2016-09-17 NOTE — Telephone Encounter (Signed)
Call in Viagra 100 mg #4 tablets with directions one po one hour before intercourse  andno refill for now

## 2016-09-17 NOTE — Telephone Encounter (Signed)
Done

## 2016-09-18 ENCOUNTER — Other Ambulatory Visit: Payer: Self-pay | Admitting: Internal Medicine

## 2016-09-18 NOTE — Telephone Encounter (Signed)
Refill x 3 months must be called in

## 2016-10-05 ENCOUNTER — Other Ambulatory Visit: Payer: Self-pay | Admitting: Internal Medicine

## 2016-10-26 ENCOUNTER — Other Ambulatory Visit: Payer: Self-pay | Admitting: Internal Medicine

## 2016-10-26 NOTE — Telephone Encounter (Signed)
Please call pharmacy and refill x one year

## 2016-10-30 ENCOUNTER — Other Ambulatory Visit: Payer: Self-pay

## 2016-10-30 ENCOUNTER — Telehealth: Payer: Self-pay | Admitting: Internal Medicine

## 2016-10-30 MED ORDER — ALPRAZOLAM 0.25 MG PO TABS: 0.2500 mg | ORAL_TABLET | Freq: Every evening | ORAL | 5 refills | Status: DC | PRN

## 2016-10-30 MED ORDER — METOCLOPRAMIDE HCL 5 MG PO TABS
5.0000 mg | ORAL_TABLET | Freq: Three times a day (TID) | ORAL | 6 refills | Status: DC
Start: 2016-10-30 — End: 2017-02-04

## 2016-10-30 NOTE — Telephone Encounter (Signed)
Patient calling to request a refill on Reglan 5mg  and Xanax 0.25mg  for sleep.  States that the Reglan and Protonix are both helping him.  He has additional refills on the Protonix, but needs another refill on the Reglan.  States that he hasn't been sick any more since he started taking these medications.  Would like a refill called to his pharmacy.    [facility] [address] # for contact:  [phone]

## 2016-10-30 NOTE — Telephone Encounter (Signed)
Both Rx's phoned into pharmacy.

## 2016-10-30 NOTE — Telephone Encounter (Signed)
Please refill both for 6 months  

## 2016-10-30 NOTE — Telephone Encounter (Signed)
Ok to refill medications

## 2016-10-30 NOTE — Telephone Encounter (Signed)
Please refill  Both x 6 months

## 2016-11-02 ENCOUNTER — Other Ambulatory Visit: Payer: Self-pay | Admitting: Internal Medicine

## 2016-11-02 MED ORDER — LOSARTAN POTASSIUM 25 MG PO TABS: 25.0000 mg | ORAL_TABLET | Freq: Every day | ORAL | 3 refills | Status: DC

## 2016-11-02 NOTE — Telephone Encounter (Signed)
[facility] request from [facility] Los[facility].  Approved and refilled per Dr Renold Genta.

## 2016-11-26 ENCOUNTER — Other Ambulatory Visit: Payer: Self-pay | Admitting: Internal Medicine

## 2016-12-07 ENCOUNTER — Telehealth: Payer: Self-pay | Admitting: Internal Medicine

## 2016-12-07 MED ORDER — PROMETHAZINE HCL 25 MG RE SUPP: RECTAL | 0 refills | Status: DC

## 2016-12-07 NOTE — Telephone Encounter (Signed)
Phenergan sent to pharmacy.  Patient aware and will call if not better.

## 2016-12-07 NOTE — Telephone Encounter (Signed)
Call in Phenergan rectal suppositories 25 mg #12 one pr q 4-6 hours prn See tomorrow if no better.

## 2016-12-07 NOTE — Telephone Encounter (Signed)
Patient called requesting a suppository for nausea.  He is throwing up about every hour.  He has tried oral medication but has thrown up.

## 2017-02-04 ENCOUNTER — Other Ambulatory Visit: Payer: Self-pay

## 2017-02-04 MED ORDER — METOCLOPRAMIDE HCL 5 MG PO TABS: 5.0000 mg | ORAL_TABLET | Freq: Three times a day (TID) | ORAL | 1 refills | Status: DC

## 2017-02-21 ENCOUNTER — Other Ambulatory Visit: Payer: Self-pay | Admitting: Internal Medicine

## 2017-03-23 ENCOUNTER — Other Ambulatory Visit: Payer: BLUE CROSS/BLUE SHIELD | Admitting: Internal Medicine

## 2017-03-25 ENCOUNTER — Ambulatory Visit (INDEPENDENT_AMBULATORY_CARE_PROVIDER_SITE_OTHER): Payer: BLUE CROSS/BLUE SHIELD | Admitting: Internal Medicine

## 2017-03-25 VITALS — BP 120/60 | HR 76 | Temp 97.3°F | Ht 69.0 in | Wt 164.0 lb

## 2017-03-25 DIAGNOSIS — I351 Nonrheumatic aortic (valve) insufficiency: Secondary | ICD-10-CM

## 2017-03-25 DIAGNOSIS — Z23 Encounter for immunization: Secondary | ICD-10-CM | POA: Diagnosis not present

## 2017-03-25 DIAGNOSIS — Z79899 Other long term (current) drug therapy: Secondary | ICD-10-CM

## 2017-03-25 DIAGNOSIS — E119 Type 2 diabetes mellitus without complications: Secondary | ICD-10-CM | POA: Diagnosis not present

## 2017-03-25 DIAGNOSIS — K3184 Gastroparesis: Secondary | ICD-10-CM

## 2017-03-25 DIAGNOSIS — E1143 Type 2 diabetes mellitus with diabetic autonomic (poly)neuropathy: Secondary | ICD-10-CM | POA: Diagnosis not present

## 2017-03-25 DIAGNOSIS — IMO0001 Reserved for inherently not codable concepts without codable children: Secondary | ICD-10-CM

## 2017-03-25 DIAGNOSIS — E785 Hyperlipidemia, unspecified: Secondary | ICD-10-CM | POA: Diagnosis not present

## 2017-03-25 DIAGNOSIS — Z794 Long term (current) use of insulin: Secondary | ICD-10-CM

## 2017-03-25 DIAGNOSIS — Z1211 Encounter for screening for malignant neoplasm of colon: Secondary | ICD-10-CM | POA: Diagnosis not present

## 2017-03-25 DIAGNOSIS — E1142 Type 2 diabetes mellitus with diabetic polyneuropathy: Secondary | ICD-10-CM | POA: Diagnosis not present

## 2017-03-25 LAB — HEPATIC FUNCTION PANEL
ALK PHOS: 62 U/L (ref 40–115)
ALT: 18 U/L (ref 9–46)
AST: 17 U/L (ref 10–35)
Albumin: 4.2 g/dL (ref 3.6–5.1)
BILIRUBIN DIRECT: 0.1 mg/dL (ref <=0.2)
BILIRUBIN INDIRECT: 0.4 mg/dL (ref 0.2–1.2)
TOTAL PROTEIN: 6.9 g/dL (ref 6.1–8.1)
Total Bilirubin: 0.5 mg/dL (ref 0.2–1.2)

## 2017-03-25 LAB — LIPID PANEL
CHOLESTEROL: 176 mg/dL (ref <200)
HDL: 48 mg/dL (ref >40)
LDL Cholesterol: 79 mg/dL (ref <100)
Total CHOL/HDL Ratio: 3.7 Ratio (ref <5.0)
Triglycerides: 243 mg/dL — ABNORMAL HIGH (ref <150)
VLDL: 49 mg/dL — AB (ref <30)

## 2017-03-25 MED ORDER — INSULIN DETEMIR 100 UNIT/ML FLEXPEN: PEN_INJECTOR | SUBCUTANEOUS | 5 refills | Status: DC

## 2017-03-25 MED ORDER — INSULIN ASPART 100 UNIT/ML FLEXPEN: PEN_INJECTOR | SUBCUTANEOUS | 11 refills | Status: DC

## 2017-03-25 NOTE — Progress Notes (Signed)
   Subjective:    Patient ID: ALFONZA TOFT, male    DOB: Jun 04, 1951, 66 y.o.   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 Upped Levimir  to 30 hs  No vomiting since December. Has Reglan but has not been taking regularly.  Accuchecks 343-127 seems to average 200.  Hx mild to moderate aortic regurgitation. Went to [place] and hiked 7 miles without difficulty. Last Echo 2016.  Review of Systems some numbness in feet. Reminded about eye exam. No chest pain. No SOB.     Objective:   Physical Exam  Cardiovascular: Normal rate, regular rhythm and intact distal pulses.   Chest clear to auscultation. Extremity is without edema.        Assessment & Plan:  IDDM- increase Novolog to 25. Increase Levimir to 35.Hemoglobin A1c 8% and was 8.1% 7 months ago  Aortic insufficiency- mild to moderate  Unexplained nausea and vomiting-possible gastroparesis. Improved with Reglan  Hyperlipidemia-triglycerides are 243 and previously were 157 months ago. Needs to work on diet and exercise.  Plan: Return in October for physical exam.

## 2017-03-26 LAB — MICROALBUMIN / CREATININE URINE RATIO
Creatinine, Urine: 73 mg/dL (ref 20–370)
MICROALB/CREAT RATIO: 12 ug/mg{creat} (ref <30)
Microalb, Ur: 0.9 mg/dL

## 2017-03-26 LAB — HEMOGLOBIN A1C
Hgb A1c MFr Bld: 8 % — ABNORMAL HIGH (ref <5.7)
MEAN PLASMA GLUCOSE: 183 mg/dL

## 2017-04-07 ENCOUNTER — Other Ambulatory Visit: Payer: Self-pay | Admitting: Internal Medicine

## 2017-04-07 NOTE — Telephone Encounter (Signed)
Refill x one year °

## 2017-04-17 ENCOUNTER — Encounter: Payer: Self-pay | Admitting: Internal Medicine

## 2017-04-17 NOTE — Patient Instructions (Addendum)
Please watch diet. Increase NovoLog to 25 units and Levemir to 35 units. Pneumococcal vaccine given. Colonoscopy referral made.

## 2017-04-29 ENCOUNTER — Encounter: Payer: Self-pay | Admitting: Gastroenterology

## 2017-05-09 ENCOUNTER — Other Ambulatory Visit: Payer: Self-pay | Admitting: Internal Medicine

## 2017-05-10 NOTE — Telephone Encounter (Signed)
Refill x 90 days 

## 2017-06-22 ENCOUNTER — Ambulatory Visit (AMBULATORY_SURGERY_CENTER): Payer: Self-pay

## 2017-06-22 VITALS — Ht 67.5 in | Wt 169.0 lb

## 2017-06-22 DIAGNOSIS — Z1211 Encounter for screening for malignant neoplasm of colon: Secondary | ICD-10-CM

## 2017-06-22 MED ORDER — SUPREP BOWEL PREP KIT 17.5-3.13-1.6 GM/177ML PO SOLN: 1.0000 | Freq: Once | ORAL | 0 refills | Status: AC

## 2017-06-22 NOTE — Progress Notes (Signed)
No allergies to eggs or soy No diet meds No home oxygen No past problems with anesthesia  Declined emmi 

## 2017-07-05 ENCOUNTER — Telehealth: Payer: Self-pay | Admitting: Gastroenterology

## 2017-07-05 NOTE — Telephone Encounter (Signed)
Pt states suprep was 136$-- [facility]- gave pharmacist [facility] $50 coupon info for prep to be $ 50 dollars  BIN [id number] PCN- [id number] ID [id number]  PT INFORMED PREP WILL BE 50 - Pharmacist states prep is in [place] RN

## 2017-07-07 ENCOUNTER — Other Ambulatory Visit: Payer: Self-pay | Admitting: Internal Medicine

## 2017-07-07 ENCOUNTER — Ambulatory Visit (AMBULATORY_SURGERY_CENTER): Payer: BLUE CROSS/BLUE SHIELD | Admitting: Gastroenterology

## 2017-07-07 ENCOUNTER — Encounter: Payer: Self-pay | Admitting: Gastroenterology

## 2017-07-07 VITALS — BP 110/62 | HR 68 | Temp 97.8°F | Resp 18 | Ht 67.0 in | Wt 169.0 lb

## 2017-07-07 DIAGNOSIS — Z1212 Encounter for screening for malignant neoplasm of rectum: Secondary | ICD-10-CM | POA: Diagnosis not present

## 2017-07-07 DIAGNOSIS — D128 Benign neoplasm of rectum: Secondary | ICD-10-CM

## 2017-07-07 DIAGNOSIS — Z1211 Encounter for screening for malignant neoplasm of colon: Secondary | ICD-10-CM

## 2017-07-07 MED ORDER — SODIUM CHLORIDE 0.9 % IV SOLN: 500.0000 mL | INTRAVENOUS | Status: DC

## 2017-07-07 NOTE — Patient Instructions (Signed)
YOU HAD AN ENDOSCOPIC PROCEDURE TODAY AT THE [facility]:   Refer to the procedure report that was given to you for any specific questions about what was found during the examination.  If the procedure report does not answer your questions, please call your gastroenterologist to clarify.  If you requested that your care partner not be given the details of your procedure findings, then the procedure report has been included in a sealed envelope for you to review at your convenience later.  YOU SHOULD EXPECT: Some feelings of bloating in the abdomen. Passage of more gas than usual.  Walking can help get rid of the air that was put into your GI tract during the procedure and reduce the bloating. If you had a lower endoscopy (such as a colonoscopy or flexible sigmoidoscopy) you may notice spotting of blood in your stool or on the toilet paper. If you underwent a bowel prep for your procedure, you may not have a normal bowel movement for a few days.  Please Note:  You might notice some irritation and congestion in your nose or some drainage.  This is from the oxygen used during your procedure.  There is no need for concern and it should clear up in a day or so.  SYMPTOMS TO REPORT IMMEDIATELY:   Following lower endoscopy (colonoscopy or flexible sigmoidoscopy):  Excessive amounts of blood in the stool  Significant tenderness or worsening of abdominal pains  Swelling of the abdomen that is new, acute  Fever of 100F or higher   For urgent or emergent issues, a gastroenterologist can be reached at any hour by calling [phone].   DIET:  We do recommend a small meal at first, but then you may proceed to your regular diet.  Drink plenty of fluids but you should avoid alcoholic beverages for 24 hours. Try to increase the fiber in your diet, and drink plenty of water.  ACTIVITY:  You should plan to take it easy for the rest of today and you should NOT DRIVE or use heavy machinery until  tomorrow (because of the sedation medicines used during the test).    FOLLOW UP: Our staff will call the number listed on your records the next business day following your procedure to check on you and address any questions or concerns that you may have regarding the information given to you following your procedure. If we do not reach you, we will leave a message.  However, if you are feeling well and you are not experiencing any problems, there is no need to return our call.  We will assume that you have returned to your regular daily activities without incident.  If any biopsies were taken you will be contacted by phone or by letter within the next 1-3 weeks.  Please call us at [phone] if you have not heard about the biopsies in 3 weeks.    SIGNATURES/CONFIDENTIALITY: You and/or your care partner have signed paperwork which will be entered into your electronic medical record.  These signatures attest to the fact that that the information above on your After Visit Summary has been reviewed and is understood.  Full responsibility of the confidentiality of this discharge information lies with you and/or your care-partner.  Read all of the handouts given to you by your recovery room nurse. 

## 2017-07-07 NOTE — Telephone Encounter (Signed)
Refill x 6 months 

## 2017-07-07 NOTE — Op Note (Signed)
[facility] Patient Name: Joseph Dixon Procedure Date: 07/07/2017 11:06 AM MRN: [id number] Endoscopist: Ladene Artist , MD Age: 66 Referring MD:  Date of Birth: 05-10-1951 Gender: Male Account #: [id number] Procedure:                Colonoscopy Indications:              Screening for colorectal malignant neoplasm Medicines:                Monitored Anesthesia Care Procedure:                Pre-Anesthesia Assessment:                           - Prior to the procedure, a History and Physical                            was performed, and patient medications and                            allergies were reviewed. The patient's tolerance of                            previous anesthesia was also reviewed. The risks                            and benefits of the procedure and the sedation                            options and risks were discussed with the patient.                            All questions were answered, and informed consent                            was obtained. Prior Anticoagulants: The patient has                            taken no previous anticoagulant or antiplatelet                            agents. ASA Grade Assessment: II - A patient with                            mild systemic disease. After reviewing the risks                            and benefits, the patient was deemed in                            satisfactory condition to undergo the procedure.                           After obtaining informed consent, the colonoscope  was passed under direct vision. Throughout the                            procedure, the patient's blood pressure, pulse, and                            oxygen saturations were monitored continuously. The                            Model PCF-[id number]) scope was introduced                            through the anus and advanced to the the cecum,                            identified by  appendiceal orifice and ileocecal                            valve. The ileocecal valve, appendiceal orifice,                            and rectum were photographed. The quality of the                            bowel preparation was good. The colonoscopy was                            performed without difficulty. The patient tolerated                            the procedure well. Scope In: 11:09:40 AM Scope Out: 11:23:45 AM Scope Withdrawal Time: 0 hours 12 minutes 3 seconds  Total Procedure Duration: 0 hours 14 minutes 5 seconds  Findings:                 The perianal and digital rectal examinations were                            normal.                           A 7 mm polyp was found in the rectum. The polyp was                            sessile. The polyp was removed with a cold snare.                            Resection and retrieval were complete.                           A few small-mouthed diverticula were found in the                            sigmoid colon. There was no evidence of  diverticular bleeding.                           Internal hemorrhoids were found during                            retroflexion. The hemorrhoids were small and Grade                            I (internal hemorrhoids that do not prolapse).                           The exam was otherwise without abnormality on                            direct and retroflexion views. Complications:            No immediate complications. Estimated blood loss:                            None. Estimated Blood Loss:     Estimated blood loss: none. Impression:               - One 7 mm polyp in the rectum, removed with a cold                            snare. Resected and retrieved.                           - Mild diverticulosis in the sigmoid colon. There                            was no evidence of diverticular bleeding.                           - Internal hemorrhoids.                            - The examination was otherwise normal on direct                            and retroflexion views. Recommendation:           - Repeat colonoscopy in 5 years for surveillance if                            polyp is precancerous, otherwise 10 years for                            screening.                           - Patient has a contact number available for                            emergencies. The signs and symptoms of potential  delayed complications were discussed with the                            patient. Return to normal activities tomorrow.                            Written discharge instructions were provided to the                            patient.                           - Resume previous diet.                           - Continue present medications.                           - Await pathology results. Ladene Artist, MD 07/07/2017 11:26:25 AM This report has been signed electronically.

## 2017-07-07 NOTE — Progress Notes (Signed)
Called to room to assist during endoscopic procedure.  Patient ID and intended procedure confirmed with present staff. Received instructions for my participation in the procedure from the performing physician.  

## 2017-07-07 NOTE — Progress Notes (Signed)
A and O x3. Report to RN. Tolerated MAC anesthesia well.

## 2017-07-07 NOTE — Progress Notes (Signed)
Pt's states no medical or surgical changes since previsit or office visit. 

## 2017-07-08 ENCOUNTER — Telehealth: Payer: Self-pay | Admitting: *Deleted

## 2017-07-08 NOTE — Telephone Encounter (Signed)
  Follow up Call-  Call back number 07/07/2017  Post procedure Call Back phone  # [phone]  Permission to leave phone message Yes  Some recent data might be hidden     Patient questions:  Do you have a fever, pain , or abdominal swelling? No. Pain Score  0 *  Have you tolerated food without any problems? Yes.    Have you been able to return to your normal activities? Yes.    Do you have any questions about your discharge instructions: Diet   No. Medications  No. Follow up visit  No.  Do you have questions or concerns about your Care? No.  Actions: * If pain score is 4 or above: No action needed, pain <4.

## 2017-07-08 NOTE — Telephone Encounter (Signed)
No connection on f/u call

## 2017-07-15 ENCOUNTER — Encounter: Payer: Self-pay | Admitting: Gastroenterology

## 2017-08-11 ENCOUNTER — Other Ambulatory Visit: Payer: Self-pay | Admitting: Internal Medicine

## 2017-08-12 NOTE — Telephone Encounter (Signed)
Refill one time only

## 2017-08-12 NOTE — Telephone Encounter (Signed)
[id number] [place] [place] [address].Phone: [phone]

## 2017-09-28 ENCOUNTER — Other Ambulatory Visit: Payer: BLUE CROSS/BLUE SHIELD | Admitting: Internal Medicine

## 2017-09-30 ENCOUNTER — Encounter: Payer: BLUE CROSS/BLUE SHIELD | Admitting: Internal Medicine

## 2017-11-22 ENCOUNTER — Other Ambulatory Visit: Payer: Self-pay | Admitting: Internal Medicine

## 2017-11-22 NOTE — Telephone Encounter (Signed)
Call in refill x 60days. Has appt January

## 2017-11-23 NOTE — Telephone Encounter (Signed)
[facility] [address] [facility] per verbal order by Dr. Renold Genta.

## 2017-11-24 NOTE — Telephone Encounter (Signed)
[facility] [address] [facility] per verbal order by Dr. Renold Genta.

## 2017-12-15 ENCOUNTER — Other Ambulatory Visit: Payer: Self-pay | Admitting: Internal Medicine

## 2017-12-15 DIAGNOSIS — E119 Type 2 diabetes mellitus without complications: Secondary | ICD-10-CM

## 2017-12-15 DIAGNOSIS — Z0000 Encounter for general adult medical examination without abnormal findings: Secondary | ICD-10-CM

## 2017-12-15 DIAGNOSIS — Z125 Encounter for screening for malignant neoplasm of prostate: Secondary | ICD-10-CM

## 2017-12-15 DIAGNOSIS — IMO0001 Reserved for inherently not codable concepts without codable children: Secondary | ICD-10-CM

## 2017-12-15 DIAGNOSIS — Z794 Long term (current) use of insulin: Secondary | ICD-10-CM

## 2017-12-15 DIAGNOSIS — E785 Hyperlipidemia, unspecified: Secondary | ICD-10-CM

## 2017-12-19 ENCOUNTER — Other Ambulatory Visit: Payer: Self-pay | Admitting: Internal Medicine

## 2017-12-30 ENCOUNTER — Other Ambulatory Visit: Payer: BLUE CROSS/BLUE SHIELD | Admitting: Internal Medicine

## 2017-12-30 DIAGNOSIS — Z125 Encounter for screening for malignant neoplasm of prostate: Secondary | ICD-10-CM

## 2017-12-30 DIAGNOSIS — Z0000 Encounter for general adult medical examination without abnormal findings: Secondary | ICD-10-CM

## 2017-12-30 DIAGNOSIS — IMO0001 Reserved for inherently not codable concepts without codable children: Secondary | ICD-10-CM

## 2017-12-30 DIAGNOSIS — E785 Hyperlipidemia, unspecified: Secondary | ICD-10-CM

## 2017-12-30 DIAGNOSIS — E119 Type 2 diabetes mellitus without complications: Secondary | ICD-10-CM

## 2017-12-30 DIAGNOSIS — Z794 Long term (current) use of insulin: Secondary | ICD-10-CM

## 2017-12-31 LAB — CBC WITH DIFFERENTIAL/PLATELET
BASOS ABS: 80 {cells}/uL (ref 0–200)
Basophils Relative: 1 %
EOS ABS: 232 {cells}/uL (ref 15–500)
Eosinophils Relative: 2.9 %
HCT: 47 % (ref 38.5–50.0)
HEMOGLOBIN: 15.7 g/dL (ref 13.2–17.1)
Lymphs Abs: 2008 cells/uL (ref 850–3900)
MCH: 29.1 pg (ref 27.0–33.0)
MCHC: 33.4 g/dL (ref 32.0–36.0)
MCV: 87 fL (ref 80.0–100.0)
MONOS PCT: 7 %
MPV: 11.8 fL (ref 7.5–12.5)
NEUTROS ABS: 5120 {cells}/uL (ref 1500–7800)
Neutrophils Relative %: 64 %
Platelets: 254 10*3/uL (ref 140–400)
RBC: 5.4 10*6/uL (ref 4.20–5.80)
RDW: 13.4 % (ref 11.0–15.0)
TOTAL LYMPHOCYTE: 25.1 %
WBC mixed population: 560 cells/uL (ref 200–950)
WBC: 8 10*3/uL (ref 3.8–10.8)

## 2017-12-31 LAB — COMPLETE METABOLIC PANEL WITH GFR
AG RATIO: 1.8 (calc) (ref 1.0–2.5)
ALBUMIN MSPROF: 4.5 g/dL (ref 3.6–5.1)
ALT: 19 U/L (ref 9–46)
AST: 18 U/L (ref 10–35)
Alkaline phosphatase (APISO): 74 U/L (ref 40–115)
BUN: 15 mg/dL (ref 7–25)
CO2: 25 mmol/L (ref 20–32)
Calcium: 9.2 mg/dL (ref 8.6–10.3)
Chloride: 104 mmol/L (ref 98–110)
Creat: 0.79 mg/dL (ref 0.70–1.25)
GFR, EST AFRICAN AMERICAN: 110 mL/min/{1.73_m2} (ref >=60)
GFR, EST NON AFRICAN AMERICAN: 95 mL/min/{1.73_m2} (ref >=60)
GLOBULIN: 2.5 g/dL (ref 1.9–3.7)
Glucose, Bld: 188 mg/dL — ABNORMAL HIGH (ref 65–99)
POTASSIUM: 5.2 mmol/L (ref 3.5–5.3)
SODIUM: 138 mmol/L (ref 135–146)
TOTAL PROTEIN: 7 g/dL (ref 6.1–8.1)
Total Bilirubin: 0.4 mg/dL (ref 0.2–1.2)

## 2017-12-31 LAB — LIPID PANEL
CHOLESTEROL: 152 mg/dL (ref <200)
HDL: 46 mg/dL (ref >40)
LDL Cholesterol (Calc): 75 mg/dL (calc)
Non-HDL Cholesterol (Calc): 106 mg/dL (calc) (ref <130)
Total CHOL/HDL Ratio: 3.3 (calc) (ref <5.0)
Triglycerides: 223 mg/dL — ABNORMAL HIGH (ref <150)

## 2017-12-31 LAB — MICROALBUMIN / CREATININE URINE RATIO
Creatinine, Urine: 129 mg/dL (ref 20–320)
Microalb Creat Ratio: 34 mcg/mg creat — ABNORMAL HIGH (ref <30)
Microalb, Ur: 4.4 mg/dL

## 2017-12-31 LAB — HEMOGLOBIN A1C
EAG (MMOL/L): 10.6 (calc)
Hgb A1c MFr Bld: 8.3 % of total Hgb — ABNORMAL HIGH (ref <5.7)
Mean Plasma Glucose: 192 (calc)

## 2017-12-31 LAB — PSA: PSA: 0.3 ng/mL (ref <=4.0)

## 2018-01-04 ENCOUNTER — Other Ambulatory Visit: Payer: BLUE CROSS/BLUE SHIELD | Admitting: Internal Medicine

## 2018-01-04 ENCOUNTER — Encounter: Payer: Self-pay | Admitting: Internal Medicine

## 2018-01-04 ENCOUNTER — Ambulatory Visit: Payer: BLUE CROSS/BLUE SHIELD | Admitting: Internal Medicine

## 2018-01-04 VITALS — BP 140/60 | HR 85 | Ht 67.0 in | Wt 170.0 lb

## 2018-01-04 DIAGNOSIS — I351 Nonrheumatic aortic (valve) insufficiency: Secondary | ICD-10-CM | POA: Diagnosis not present

## 2018-01-04 DIAGNOSIS — Z794 Long term (current) use of insulin: Secondary | ICD-10-CM | POA: Diagnosis not present

## 2018-01-04 DIAGNOSIS — K3184 Gastroparesis: Secondary | ICD-10-CM

## 2018-01-04 DIAGNOSIS — IMO0001 Reserved for inherently not codable concepts without codable children: Secondary | ICD-10-CM

## 2018-01-04 DIAGNOSIS — E785 Hyperlipidemia, unspecified: Secondary | ICD-10-CM | POA: Diagnosis not present

## 2018-01-04 DIAGNOSIS — E1143 Type 2 diabetes mellitus with diabetic autonomic (poly)neuropathy: Secondary | ICD-10-CM | POA: Diagnosis not present

## 2018-01-04 DIAGNOSIS — Z0000 Encounter for general adult medical examination without abnormal findings: Secondary | ICD-10-CM | POA: Diagnosis not present

## 2018-01-04 DIAGNOSIS — E119 Type 2 diabetes mellitus without complications: Secondary | ICD-10-CM

## 2018-01-04 LAB — POCT URINALYSIS DIPSTICK
APPEARANCE: NORMAL
Bilirubin, UA: NEGATIVE
Blood, UA: NEGATIVE
Glucose, UA: 2000
KETONES UA: NEGATIVE
Leukocytes, UA: NEGATIVE
NITRITE UA: NEGATIVE
ODOR: NORMAL
PH UA: 6 (ref 5.0–8.0)
PROTEIN UA: NEGATIVE
Spec Grav, UA: 1.015 (ref 1.010–1.025)
UROBILINOGEN UA: 0.2 U/dL

## 2018-01-04 MED ORDER — INSULIN DETEMIR 100 UNIT/ML FLEXPEN: 40.0000 [IU] | PEN_INJECTOR | Freq: Every day | SUBCUTANEOUS | 11 refills | Status: DC

## 2018-01-04 MED ORDER — ROSUVASTATIN CALCIUM 20 MG PO TABS: 20.0000 mg | ORAL_TABLET | Freq: Every day | ORAL | 3 refills | Status: DC

## 2018-01-04 NOTE — Patient Instructions (Addendum)
Increase Levimir to 40 Units  At 10 pm. Increase Crestor to 20 mg daily and RTC in 8 weeks
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
## 2018-02-05 ENCOUNTER — Other Ambulatory Visit: Payer: Self-pay | Admitting: Internal Medicine

## 2018-02-10 ENCOUNTER — Other Ambulatory Visit: Payer: Self-pay

## 2018-02-10 DIAGNOSIS — IMO0001 Reserved for inherently not codable concepts without codable children: Secondary | ICD-10-CM

## 2018-02-10 DIAGNOSIS — E119 Type 2 diabetes mellitus without complications: Secondary | ICD-10-CM

## 2018-02-10 DIAGNOSIS — Z794 Long term (current) use of insulin: Secondary | ICD-10-CM

## 2018-02-10 DIAGNOSIS — E785 Hyperlipidemia, unspecified: Secondary | ICD-10-CM

## 2018-02-28 ENCOUNTER — Other Ambulatory Visit: Payer: Self-pay | Admitting: Internal Medicine

## 2018-02-28 NOTE — Telephone Encounter (Signed)
Call in #[phone] with no refill one po qhs prn sleep

## 2018-03-01 ENCOUNTER — Other Ambulatory Visit: Payer: BLUE CROSS/BLUE SHIELD | Admitting: Internal Medicine

## 2018-03-01 DIAGNOSIS — E119 Type 2 diabetes mellitus without complications: Secondary | ICD-10-CM

## 2018-03-01 DIAGNOSIS — Z794 Long term (current) use of insulin: Secondary | ICD-10-CM

## 2018-03-01 DIAGNOSIS — E785 Hyperlipidemia, unspecified: Secondary | ICD-10-CM

## 2018-03-01 DIAGNOSIS — IMO0001 Reserved for inherently not codable concepts without codable children: Secondary | ICD-10-CM

## 2018-03-02 LAB — HEMOGLOBIN A1C
Hgb A1c MFr Bld: 8.5 % of total Hgb — ABNORMAL HIGH (ref <5.7)
Mean Plasma Glucose: 197 (calc)
eAG (mmol/L): 10.9 (calc)

## 2018-03-02 LAB — HEPATIC FUNCTION PANEL
AG RATIO: 1.7 (calc) (ref 1.0–2.5)
ALBUMIN MSPROF: 4.3 g/dL (ref 3.6–5.1)
ALT: 21 U/L (ref 9–46)
AST: 22 U/L (ref 10–35)
Alkaline phosphatase (APISO): 67 U/L (ref 40–115)
BILIRUBIN TOTAL: 0.4 mg/dL (ref 0.2–1.2)
Bilirubin, Direct: 0.1 mg/dL (ref 0.0–0.2)
GLOBULIN: 2.5 g/dL (ref 1.9–3.7)
Indirect Bilirubin: 0.3 mg/dL (calc) (ref 0.2–1.2)
Total Protein: 6.8 g/dL (ref 6.1–8.1)

## 2018-03-02 LAB — LIPID PANEL
CHOLESTEROL: 141 mg/dL (ref <200)
HDL: 47 mg/dL (ref >40)
LDL Cholesterol (Calc): 71 mg/dL (calc)
Non-HDL Cholesterol (Calc): 94 mg/dL (calc) (ref <130)
Total CHOL/HDL Ratio: 3 (calc) (ref <5.0)
Triglycerides: 149 mg/dL (ref <150)

## 2018-03-03 ENCOUNTER — Encounter: Payer: Self-pay | Admitting: Internal Medicine

## 2018-03-03 ENCOUNTER — Ambulatory Visit (INDEPENDENT_AMBULATORY_CARE_PROVIDER_SITE_OTHER): Payer: BLUE CROSS/BLUE SHIELD | Admitting: Internal Medicine

## 2018-03-03 VITALS — Ht 67.0 in

## 2018-03-03 DIAGNOSIS — N529 Male erectile dysfunction, unspecified: Secondary | ICD-10-CM | POA: Diagnosis not present

## 2018-03-03 DIAGNOSIS — Z794 Long term (current) use of insulin: Secondary | ICD-10-CM | POA: Diagnosis not present

## 2018-03-03 DIAGNOSIS — E785 Hyperlipidemia, unspecified: Secondary | ICD-10-CM

## 2018-03-03 DIAGNOSIS — E119 Type 2 diabetes mellitus without complications: Secondary | ICD-10-CM

## 2018-03-03 DIAGNOSIS — IMO0001 Reserved for inherently not codable concepts without codable children: Secondary | ICD-10-CM

## 2018-03-03 MED ORDER — INSULIN DETEMIR 100 UNIT/ML FLEXPEN: PEN_INJECTOR | SUBCUTANEOUS | 11 refills | Status: DC

## 2018-03-03 NOTE — Progress Notes (Signed)
   Subjective:    Patient ID: Joseph Dixon, male    DOB: August 02, 1951, 67 y.o.   MRN: [id number]  HPI follow-up on insulin-dependent diabetes mellitus.  Here in January for routine health maintenance exam.  At that time hemoglobin A1c was 8.3%.  It is now 8.5%. Lipid panel is normal.  However in January triglycerides were 223.  In January Levemir was increased to 40 units at 10 PM.  Crestor was increased to 20 mg daily.  However I do not see much improvement in his diabetes over the past 2 months.   Review of Systems issues with erectile dysfunction.  Prescription for Viagra     Objective:   Physical Exam  Not examined but spent 25 minutes speaking with him about these issues.      Assessment & Plan:  Hyperlipidemia-continue Crestor 20 mg daily  Insulin-dependent diabetes mellitus no significant change in A1c  Erectile dysfunction-prescription for Viagra  Plan: Increase Levemir to 50 mg at 10 PM.  Follow-up in June.

## 2018-03-19 DIAGNOSIS — N529 Male erectile dysfunction, unspecified: Secondary | ICD-10-CM | POA: Insufficient documentation

## 2018-03-19 NOTE — Patient Instructions (Addendum)
Continue Crestor 20 mg daily.  Please work on diet.  Try to get some exercise.  Increase Levemir to 50 mg at 10 PM.  Follow-up in June.  Prescription for Viagra.

## 2018-04-22 ENCOUNTER — Encounter: Payer: Self-pay | Admitting: Internal Medicine

## 2018-04-22 LAB — HM DIABETES EYE EXAM

## 2018-05-03 ENCOUNTER — Other Ambulatory Visit: Payer: Self-pay | Admitting: Internal Medicine

## 2018-05-05 ENCOUNTER — Telehealth: Payer: Self-pay | Admitting: Internal Medicine

## 2018-05-05 NOTE — Telephone Encounter (Signed)
Joseph Dixon Self [id number]  Baltazar Najjar called to say that his insurance is changing, he is going to be going on Medicare and also have a supplement. With the new insurances he wanted to see if he could change his Novolog to Humalog and his Levemir to [place] so that it will save him a lot of money each month. He will call us back when he needs refills and the new insurance starts, he is okay for now.

## 2018-05-20 ENCOUNTER — Other Ambulatory Visit: Payer: Self-pay

## 2018-05-20 DIAGNOSIS — IMO0001 Reserved for inherently not codable concepts without codable children: Secondary | ICD-10-CM

## 2018-05-20 DIAGNOSIS — Z794 Long term (current) use of insulin: Principal | ICD-10-CM

## 2018-05-20 DIAGNOSIS — E119 Type 2 diabetes mellitus without complications: Principal | ICD-10-CM

## 2018-06-03 ENCOUNTER — Other Ambulatory Visit: Payer: Self-pay | Admitting: Internal Medicine

## 2018-06-03 NOTE — Telephone Encounter (Signed)
Refill x 6 months 

## 2018-06-06 ENCOUNTER — Other Ambulatory Visit: Payer: Medicare HMO | Admitting: Internal Medicine

## 2018-06-06 DIAGNOSIS — Z794 Long term (current) use of insulin: Principal | ICD-10-CM

## 2018-06-06 DIAGNOSIS — E119 Type 2 diabetes mellitus without complications: Secondary | ICD-10-CM | POA: Diagnosis not present

## 2018-06-06 DIAGNOSIS — IMO0001 Reserved for inherently not codable concepts without codable children: Secondary | ICD-10-CM

## 2018-06-07 ENCOUNTER — Ambulatory Visit (INDEPENDENT_AMBULATORY_CARE_PROVIDER_SITE_OTHER): Payer: Medicare HMO | Admitting: Internal Medicine

## 2018-06-07 ENCOUNTER — Encounter: Payer: Self-pay | Admitting: Internal Medicine

## 2018-06-07 VITALS — BP 130/80 | HR 77 | Temp 98.2°F | Wt 176.0 lb

## 2018-06-07 DIAGNOSIS — Z794 Long term (current) use of insulin: Secondary | ICD-10-CM | POA: Diagnosis not present

## 2018-06-07 DIAGNOSIS — IMO0001 Reserved for inherently not codable concepts without codable children: Secondary | ICD-10-CM

## 2018-06-07 DIAGNOSIS — E119 Type 2 diabetes mellitus without complications: Secondary | ICD-10-CM | POA: Diagnosis not present

## 2018-06-07 DIAGNOSIS — L97511 Non-pressure chronic ulcer of other part of right foot limited to breakdown of skin: Secondary | ICD-10-CM

## 2018-06-07 DIAGNOSIS — I351 Nonrheumatic aortic (valve) insufficiency: Secondary | ICD-10-CM

## 2018-06-07 DIAGNOSIS — E785 Hyperlipidemia, unspecified: Secondary | ICD-10-CM

## 2018-06-07 LAB — HEMOGLOBIN A1C
EAG (MMOL/L): 10.1 (calc)
Hgb A1c MFr Bld: 8 % of total Hgb — ABNORMAL HIGH (ref <5.7)
MEAN PLASMA GLUCOSE: 183 (calc)

## 2018-06-07 LAB — MICROALBUMIN / CREATININE URINE RATIO
CREATININE, URINE: 166 mg/dL (ref 20–320)
MICROALB UR: 1.1 mg/dL
MICROALB/CREAT RATIO: 7 ug/mg{creat} (ref <30)

## 2018-06-07 MED ORDER — MUPIROCIN 2 % EX OINT: 1.0000 "application " | TOPICAL_OINTMENT | Freq: Two times a day (BID) | CUTANEOUS | 0 refills | Status: DC

## 2018-06-07 MED ORDER — DOXYCYCLINE HYCLATE 100 MG PO TABS: 100.0000 mg | ORAL_TABLET | Freq: Two times a day (BID) | ORAL | 0 refills | Status: DC

## 2018-06-07 NOTE — Patient Instructions (Addendum)
Doxycycline 100 mg twice daily for 10 days for infected toe ulcer.  Clean and wrap sterilely twice daily.  Apply Bactroban ointment to toe ulcer.  Continue same medications.  Continue to work on diet exercise and weight loss.  Follow-up in October.  Physical exam due in January.

## 2018-06-07 NOTE — Progress Notes (Signed)
   Subjective:    Patient ID: COLONEL KRAUSER, male    DOB: 1951/11/10, 67 y.o.   MRN: [id number]  HPI   67 year old Male for  6 month recheck. Has gained 6 pounds since January. Hgb AIC is 8%. Discussion about weight and Hgb AIC results. Weight has increased 6 pounds. Has left 3rd toe ulcer which is new. He does not know how it happened. Appears to be infected.    Review of Systems     Objective:   Physical Exam  Constitutional: He appears well-developed and well-nourished. No distress.  Neck: Neck supple. No JVD present. No thyromegaly present.  No bruits  Cardiovascular: Normal rate and regular rhythm.  Murmur heard. Murmur unchanged  Pulmonary/Chest: He has no wheezes. He has no rales.  Skin: He is not diaphoretic.          Assessment & Plan:  Left third toe superficial ulcer that appears to be infected.  Cleaned and wrapped sterilely.  Bactroban ointment prescribed.  He is to clean it with peroxide apply Bactroban twice daily until healed.  Doxycycline 100 mg twice daily for 10 days.  Aortic insufficiency-stable  Hyperlipidemia-lipid panel normal  Insulin-dependent diabetes mellitus-hemoglobin A1c 8% and previously was 8.5% in March.  Continue to work on diet exercise and weight loss.  Follow-up on these issues in October or sooner if  worse

## 2018-08-15 ENCOUNTER — Encounter: Payer: Self-pay | Admitting: Internal Medicine

## 2018-08-15 DIAGNOSIS — Z85828 Personal history of other malignant neoplasm of skin: Secondary | ICD-10-CM | POA: Diagnosis not present

## 2018-08-15 DIAGNOSIS — D225 Melanocytic nevi of trunk: Secondary | ICD-10-CM | POA: Diagnosis not present

## 2018-08-15 DIAGNOSIS — D485 Neoplasm of uncertain behavior of skin: Secondary | ICD-10-CM | POA: Diagnosis not present

## 2018-08-15 DIAGNOSIS — L570 Actinic keratosis: Secondary | ICD-10-CM | POA: Diagnosis not present

## 2018-08-15 DIAGNOSIS — C44619 Basal cell carcinoma of skin of left upper limb, including shoulder: Secondary | ICD-10-CM | POA: Diagnosis not present

## 2018-09-14 ENCOUNTER — Telehealth: Payer: Self-pay

## 2018-09-14 NOTE — Telephone Encounter (Signed)
Patient called to get clarifications on his Levemir per Dr. Verlene Mayer notes he is to inject 50 units daily. Patient was notified and I called his pharmacy to clarify.

## 2018-09-30 ENCOUNTER — Other Ambulatory Visit: Payer: Self-pay | Admitting: Internal Medicine

## 2018-09-30 DIAGNOSIS — E119 Type 2 diabetes mellitus without complications: Secondary | ICD-10-CM

## 2018-09-30 DIAGNOSIS — Z794 Long term (current) use of insulin: Secondary | ICD-10-CM

## 2018-09-30 DIAGNOSIS — IMO0001 Reserved for inherently not codable concepts without codable children: Secondary | ICD-10-CM

## 2018-09-30 DIAGNOSIS — E785 Hyperlipidemia, unspecified: Secondary | ICD-10-CM

## 2018-10-04 ENCOUNTER — Other Ambulatory Visit: Payer: Medicare HMO | Admitting: Internal Medicine

## 2018-10-04 DIAGNOSIS — Z794 Long term (current) use of insulin: Secondary | ICD-10-CM

## 2018-10-04 DIAGNOSIS — E785 Hyperlipidemia, unspecified: Secondary | ICD-10-CM | POA: Diagnosis not present

## 2018-10-04 DIAGNOSIS — E119 Type 2 diabetes mellitus without complications: Secondary | ICD-10-CM | POA: Diagnosis not present

## 2018-10-04 DIAGNOSIS — IMO0001 Reserved for inherently not codable concepts without codable children: Secondary | ICD-10-CM

## 2018-10-05 LAB — LIPID PANEL
CHOLESTEROL: 121 mg/dL (ref <200)
HDL: 43 mg/dL (ref >40)
LDL Cholesterol (Calc): 48 mg/dL (calc)
NON-HDL CHOLESTEROL (CALC): 78 mg/dL (ref <130)
TRIGLYCERIDES: 240 mg/dL — AB (ref <150)
Total CHOL/HDL Ratio: 2.8 (calc) (ref <5.0)

## 2018-10-05 LAB — HEPATIC FUNCTION PANEL
AG RATIO: 1.7 (calc) (ref 1.0–2.5)
ALKALINE PHOSPHATASE (APISO): 63 U/L (ref 40–115)
ALT: 32 U/L (ref 9–46)
AST: 24 U/L (ref 10–35)
Albumin: 4 g/dL (ref 3.6–5.1)
Bilirubin, Direct: 0.1 mg/dL (ref 0.0–0.2)
Globulin: 2.3 g/dL (calc) (ref 1.9–3.7)
Indirect Bilirubin: 0.2 mg/dL (calc) (ref 0.2–1.2)
TOTAL PROTEIN: 6.3 g/dL (ref 6.1–8.1)
Total Bilirubin: 0.3 mg/dL (ref 0.2–1.2)

## 2018-10-05 LAB — HEMOGLOBIN A1C
EAG (MMOL/L): 10.6 (calc)
HEMOGLOBIN A1C: 8.3 %{Hb} — AB (ref <5.7)
Mean Plasma Glucose: 192 (calc)

## 2018-10-06 ENCOUNTER — Ambulatory Visit: Payer: Medicare HMO | Admitting: Internal Medicine

## 2018-10-06 ENCOUNTER — Encounter: Payer: Self-pay | Admitting: Internal Medicine

## 2018-10-06 VITALS — BP 140/62 | HR 81 | Temp 98.6°F | Ht 67.0 in | Wt 159.0 lb

## 2018-10-06 DIAGNOSIS — R030 Elevated blood-pressure reading, without diagnosis of hypertension: Secondary | ICD-10-CM

## 2018-10-06 DIAGNOSIS — I351 Nonrheumatic aortic (valve) insufficiency: Secondary | ICD-10-CM

## 2018-10-06 DIAGNOSIS — Z23 Encounter for immunization: Secondary | ICD-10-CM | POA: Diagnosis not present

## 2018-10-06 DIAGNOSIS — L97511 Non-pressure chronic ulcer of other part of right foot limited to breakdown of skin: Secondary | ICD-10-CM | POA: Diagnosis not present

## 2018-10-06 DIAGNOSIS — Z794 Long term (current) use of insulin: Secondary | ICD-10-CM | POA: Diagnosis not present

## 2018-10-06 DIAGNOSIS — E781 Pure hyperglyceridemia: Secondary | ICD-10-CM

## 2018-10-06 DIAGNOSIS — E119 Type 2 diabetes mellitus without complications: Secondary | ICD-10-CM | POA: Diagnosis not present

## 2018-10-06 DIAGNOSIS — IMO0001 Reserved for inherently not codable concepts without codable children: Secondary | ICD-10-CM

## 2018-10-06 MED ORDER — DOXYCYCLINE HYCLATE 100 MG PO TABS: 100.0000 mg | ORAL_TABLET | Freq: Two times a day (BID) | ORAL | 0 refills | Status: DC

## 2018-10-06 NOTE — Patient Instructions (Addendum)
RTC one week for BP check . See [facility] regarding toe ulcers. Take Doxycycline 100 mg bid for toe ulcers and multiple abrasions on arms.keep ulcers clean and dry and apply bactroban sparingly.Referral to Endocrine re diabetic control and cost consideration of insulin.CPE due February.

## 2018-10-06 NOTE — Progress Notes (Signed)
   Subjective:    Patient ID: Joseph Dixon, male    DOB: 02-14-1951, 67 y.o.   MRN: [id number]  HPI 67 year  Male for follow up Insulin dependent diabetes and hyperlipdemia. Flu vaccine given today. Pt is on Novolog and Levemir.  Hemoglobin A1c was 8.3% recently and previously 4 months ago 8%.  I do not think he is following a strict diet.  He is also developed a toe ulcer and will be referred to podiatrist.  He once again complains that something is wrong with his insulin prescriptions at the pharmacy.  I do not know of an issue from this and regarding his prescriptions.  Encouraged him to follow-up with pharmacy.  We have heard this previously for him.  His triglycerides are elevated at 240 and 7 months ago were 149.  History of hyperlipidemia.  Issues with intermittent vomiting resolved after I placed him on Reglan.  He has not had further recurrence.  He likely had gastroparesis.  Is noted to be a glaucoma suspect and has seen Dr. Reed Breech optometrist on [address]  Reminded about annual diabetic eye exam.    Review of Systems see above     Objective:   Physical Exam Blood pressure is 140/62.  Usually blood pressure is lower.  When he was at podiatrist his blood pressure was 181/81. Neck is supple without JVD thyromegaly or carotid bruits.  Chest clear.  Cardiac exam regular rate and rhythm normal S1 and S2.  Extremities without edema.  He is complaining of numbness in his feet.  Has superficial ulcer right second toe.  Denies any injury.  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  Right toe was dressed and bandaged.  He is to place Bactroban on it twice daily after soaking and cleaning with warm soapy water.  He will be referred to podiatrist.  He will  return for blood pressure check in 2 weeks.  He may need to be on antihypertensive medication.  He needs to take better care of himself watch his diet and check Accu-Cheks regularly.  He has been counseled about this in the past but does not seem to embrace management of his diabetes and other medical issues.  Referral to Endocrinology regarding diabetic control

## 2018-10-14 ENCOUNTER — Ambulatory Visit: Payer: Medicare HMO | Admitting: Podiatry

## 2018-10-14 ENCOUNTER — Encounter: Payer: Self-pay | Admitting: Podiatry

## 2018-10-14 ENCOUNTER — Telehealth: Payer: Self-pay | Admitting: *Deleted

## 2018-10-14 VITALS — BP 181/81 | HR 84 | Temp 97.6°F | Resp 16

## 2018-10-14 DIAGNOSIS — R0989 Other specified symptoms and signs involving the circulatory and respiratory systems: Secondary | ICD-10-CM

## 2018-10-14 DIAGNOSIS — L97521 Non-pressure chronic ulcer of other part of left foot limited to breakdown of skin: Secondary | ICD-10-CM | POA: Diagnosis not present

## 2018-10-14 DIAGNOSIS — E119 Type 2 diabetes mellitus without complications: Secondary | ICD-10-CM

## 2018-10-14 DIAGNOSIS — Z794 Long term (current) use of insulin: Secondary | ICD-10-CM | POA: Diagnosis not present

## 2018-10-14 DIAGNOSIS — IMO0001 Reserved for inherently not codable concepts without codable children: Secondary | ICD-10-CM

## 2018-10-14 DIAGNOSIS — M79671 Pain in right foot: Secondary | ICD-10-CM

## 2018-10-14 NOTE — Telephone Encounter (Signed)
Faxed orders to [facility]. 

## 2018-10-14 NOTE — Progress Notes (Signed)
   Subjective:    Patient ID: Joseph Dixon, male    DOB: 18-Sep-1951, 67 y.o.   MRN: [id number]  HPI    Review of Systems  Skin: Positive for color change and rash.  All other systems reviewed and are negative.      Objective:   Physical Exam        Assessment & Plan:

## 2018-10-14 NOTE — Telephone Encounter (Signed)
-----   Message from Evelina Bucy, DPM sent at 10/14/2018  3:32 PM EDT ----- Can we order non-invasive vascular studies

## 2018-10-18 ENCOUNTER — Encounter: Payer: Self-pay | Admitting: Internal Medicine

## 2018-10-18 ENCOUNTER — Ambulatory Visit (INDEPENDENT_AMBULATORY_CARE_PROVIDER_SITE_OTHER): Payer: Medicare HMO | Admitting: Internal Medicine

## 2018-10-18 ENCOUNTER — Other Ambulatory Visit: Payer: Self-pay

## 2018-10-18 VITALS — BP 162/68 | HR 75 | Wt 170.0 lb

## 2018-10-18 DIAGNOSIS — E119 Type 2 diabetes mellitus without complications: Secondary | ICD-10-CM

## 2018-10-18 DIAGNOSIS — R030 Elevated blood-pressure reading, without diagnosis of hypertension: Secondary | ICD-10-CM | POA: Diagnosis not present

## 2018-10-18 DIAGNOSIS — Z794 Long term (current) use of insulin: Secondary | ICD-10-CM | POA: Diagnosis not present

## 2018-10-18 DIAGNOSIS — L97511 Non-pressure chronic ulcer of other part of right foot limited to breakdown of skin: Secondary | ICD-10-CM

## 2018-10-18 DIAGNOSIS — I10 Essential (primary) hypertension: Secondary | ICD-10-CM

## 2018-10-18 DIAGNOSIS — IMO0001 Reserved for inherently not codable concepts without codable children: Secondary | ICD-10-CM

## 2018-10-18 NOTE — Progress Notes (Signed)
   Subjective:    Patient ID: Joseph Dixon, male    DOB: Oct 01, 1951, 67 y.o.   MRN: [id number]  HPI 67 year old Male for follow up of elevated BP reading. Has seen podiatrist and is getting ABI studies. Discussion regarding adequate glucose and BP control. Previously has not had hypertension.  Blood pressure is once again elevated today at 162/68.  He has been on a low dose of losartan for renal protection with diabetes mellitus.    Review of Systems no new complaints.  We have given him a handicap parking permit due to his toe ulcers     Objective:   Physical Exam  Chest clear.  Cardiac exam regular rate and rhythm.  No lower extremity edema.      Assessment & Plan:  Essential hypertension  Insulin-dependent diabetes mellitus  Toe ulcers-to get ABI studies in the near future and continue seeing podiatrist.  Handicap parking permit signed  Plan: Start losartan 100 mg daily and follow-up in 4 weeks.  Will need basic metabolic panel at that time.  15 minutes spent with patient.

## 2018-10-18 NOTE — Patient Instructions (Signed)
Handicap parking permit completed and signed.  To have ABI studies next week.  Start losartan 100 mg daily and follow-up in 4 weeks.

## 2018-10-20 ENCOUNTER — Other Ambulatory Visit: Payer: Self-pay | Admitting: Internal Medicine

## 2018-10-20 MED ORDER — LOSARTAN POTASSIUM 100 MG PO TABS: 100.0000 mg | ORAL_TABLET | Freq: Every day | ORAL | 0 refills | Status: DC

## 2018-10-20 NOTE — Telephone Encounter (Signed)
Call in Losartan 100 mg #30 one po daily

## 2018-10-27 ENCOUNTER — Ambulatory Visit (HOSPITAL_COMMUNITY)
Admission: RE | Admit: 2018-10-27 | Discharge: 2018-10-27 | Disposition: A | Discharge status: Home / Self Care | Payer: Medicare HMO | Source: Ambulatory Visit | Attending: Cardiology | Admitting: Cardiology

## 2018-10-27 DIAGNOSIS — M79671 Pain in right foot: Secondary | ICD-10-CM

## 2018-10-27 DIAGNOSIS — R0989 Other specified symptoms and signs involving the circulatory and respiratory systems: Secondary | ICD-10-CM

## 2018-10-28 ENCOUNTER — Ambulatory Visit: Payer: Medicare HMO | Admitting: Podiatry

## 2018-10-28 DIAGNOSIS — L97521 Non-pressure chronic ulcer of other part of left foot limited to breakdown of skin: Secondary | ICD-10-CM

## 2018-10-28 DIAGNOSIS — E08621 Diabetes mellitus due to underlying condition with foot ulcer: Secondary | ICD-10-CM

## 2018-10-28 MED ORDER — CLINDAMYCIN HCL 150 MG PO CAPS: 150.0000 mg | ORAL_CAPSULE | Freq: Two times a day (BID) | ORAL | 0 refills | Status: DC

## 2018-10-28 NOTE — Progress Notes (Signed)
Subjective:  Patient ID: Joseph Dixon, male    DOB: 25-Jan-1951,  MRN: [id number]  Chief Complaint  Patient presents with  . Foot Ulcer    left foot ulcer - great toe - looks better    67 y.o. male presents for wound care. States the ulcer is looking much better.  Review of Systems: Negative except as noted in the HPI. Denies N/V/F/Ch.  Past Medical History:  Diagnosis Date  . Cancer (HCC)    basal cell both shoulders  . Diabetes mellitus    insulin dependant  . Hyperlipidemia     Current Outpatient Medications:  .  ALPRAZolam (XANAX) 0.25 MG tablet, TAKE 1 TABLET BY MOUTH AT BEDTIME AS NEEDED, Disp: 90 tablet, Rfl: 1 .  B-D INS SYR ULTRAFINE 1CC/31G 31G X 5/16" 1 ML MISC, USE AS DIRECTED, Disp: 100 each, Rfl: 3 .  blood glucose meter kit and supplies KIT, Dispense based on patient and insurance preference. ICD-10: E11.9, Disp: 1 each, Rfl: 0 .  clindamycin (CLEOCIN) 150 MG capsule, Take 1 capsule (150 mg total) by mouth 2 (two) times daily., Disp: 14 capsule, Rfl: 0 .  doxycycline (VIBRA-TABS) 100 MG tablet, Take 1 tablet (100 mg total) by mouth 2 (two) times daily., Disp: 20 tablet, Rfl: 0 .  glucose blood test strip, Use as instructed to check blood sugar before meals and at bedtime, Disp: 200 each, Rfl: 12 .  Insulin Detemir (LEVEMIR FLEXTOUCH) 100 UNIT/ML Pen, Inject 50 units into skin daily at 10 pm, Disp: 15 mL, Rfl: 11 .  Lancets (ONETOUCH ULTRASOFT) lancets, Use as instructed, Disp: 200 each, Rfl: 12 .  losartan (COZAAR) 100 MG tablet, Take 1 tablet (100 mg total) by mouth daily., Disp: 30 tablet, Rfl: 0 .  metoCLOPramide (REGLAN) 5 MG tablet, Take 1 tablet (5 mg total) by mouth 3 (three) times daily before meals., Disp: 270 tablet, Rfl: 1 .  mupirocin ointment (BACTROBAN) 2 %, Place 1 application into the nose 2 (two) times daily., Disp: 22 g, Rfl: 0 .  NOVOLOG FLEXPEN 100 UNIT/ML FlexPen, INJECT 25 UNITS INTO THE SKIN 3 TIMES DAILY WITH MEALS., Disp: 15 pen, Rfl: prn .   pantoprazole (PROTONIX) 40 MG tablet, TAKE 1 TABLET EVERY DAY, Disp: 30 tablet, Rfl: 11 .  promethazine (PHENERGAN) 25 MG tablet, Take 1 tablet (25 mg total) by mouth every 8 (eight) hours as needed for nausea or vomiting., Disp: 20 tablet, Rfl: 0 .  rosuvastatin (CRESTOR) 20 MG tablet, Take 1 tablet (20 mg total) by mouth daily., Disp: 90 tablet, Rfl: 3 .  VIAGRA 100 MG tablet, TAKE 1 TABLET 1 HOUR BEFORE INTERCOURSE, Disp: 4 tablet, Rfl: 11  Social History   Tobacco Use  Smoking Status Former Smoker  Smokeless Tobacco Never Used  Tobacco Comment   quit 10 years ago    Allergies  Allergen Reactions  . Sulfa Antibiotics     hallucinations   Objective:  There were no vitals filed for this visit. There is no height or weight on file to calculate BMI. Constitutional Well developed. Well nourished.  Vascular Dorsalis pedis pulses palpable bilaterally. Posterior tibial pulses palpable bilaterally. Capillary refill normal to all digits.  No cyanosis or clubbing noted. Pedal hair growth normal.  Neurologic Normal speech. Oriented to person, place, and time. Protective sensation absent  Dermatologic Wound Location: [place] Wound Base: Granular/Healthy Peri-wound: Reddened, Calloused Exudate: None: wound tissue dry Wound Measurements: -0.5x0.5   Orthopedic: No pain to palpation either foot.  Radiographs: None today Assessment:   1. Diabetic ulcer of toe of left foot associated with diabetes mellitus due to underlying condition, limited to breakdown of skin [facility])    Plan:  Patient was evaluated and treated and all questions answered.  Ulcer L Great Toe -Debridement as below. -Dressed with silvadene, DSD. -Continue off-loading with surgical shoe. -Rx Clindamycin for periwound erythema.  Procedure: Selective Debridement of Wound Rationale: Removal of devitalized tissue from the wound to promote healing.  Pre-Debridement Wound Measurements: 0.5 cm x 0.5 cm x 0.1 cm    Post-Debridement Wound Measurements: same as pre-debridement. Type of Debridement: sharp selective Tissue Removed: Devitalized soft-tissue Dressing: Dry, sterile, compression dressing. Disposition: Patient tolerated procedure well. Patient to return in 1 week for follow-up.   Return in about 3 weeks (around 11/18/2018) for Wound Care, Left.

## 2018-10-29 ENCOUNTER — Other Ambulatory Visit: Payer: Self-pay | Admitting: Internal Medicine

## 2018-11-08 ENCOUNTER — Ambulatory Visit: Payer: Medicare HMO | Admitting: Cardiovascular Disease

## 2018-11-08 ENCOUNTER — Encounter: Payer: Self-pay | Admitting: Cardiovascular Disease

## 2018-11-08 VITALS — BP 142/64 | HR 65 | Ht 67.0 in | Wt 170.0 lb

## 2018-11-08 DIAGNOSIS — I10 Essential (primary) hypertension: Secondary | ICD-10-CM | POA: Diagnosis not present

## 2018-11-08 DIAGNOSIS — E785 Hyperlipidemia, unspecified: Secondary | ICD-10-CM

## 2018-11-08 DIAGNOSIS — I739 Peripheral vascular disease, unspecified: Secondary | ICD-10-CM

## 2018-11-08 MED ORDER — ASPIRIN EC 81 MG PO TBEC: 81.0000 mg | DELAYED_RELEASE_TABLET | Freq: Every day | ORAL | 3 refills | Status: DC

## 2018-11-08 NOTE — Progress Notes (Signed)
Cardiology Office Note   Date:  11/08/2018   ID:  Joseph Dixon, DOB [birth year], MRN [id number]  PCP:  Elby Showers, MD  Cardiologist:   Kathlyn Sacramento, MD   Chief Complaint  Patient presents with  . Follow-up    doppler, pt denid chest pain      History of Present Illness: Joseph Dixon is a 67 y.o. male who was referred by Dr. March Rummage for evaluation and management of peripheral arterial disease.  The patient has known history of diabetes mellitus, hypertension and hyperlipidemia.  He has no previous cardiac history and no prior history of peripheral arterial disease.  He is not a smoker and has no family history of premature coronary artery disease. The patient developed a small ulceration on the left big toe a few weeks ago and this has been slow to heal.  It is improving significantly.  He has no pain and no claudication.  He denies any chest pain, shortness of breath or palpitations. He underwent noninvasive vascular evaluation which showed near normal ABI.  Duplex showed mainly tibial disease with an occluded anterior tibial artery on the right and an occluded anterior tibial and peroneal arteries on the left.    Past Medical History:  Diagnosis Date  . Cancer (HCC)    basal cell both shoulders  . Diabetes mellitus    insulin dependant  . Hyperlipidemia     Past Surgical History:  Procedure Laterality Date  . repair of rt knee laceration       Current Outpatient Medications  Medication Sig Dispense Refill  . ALPRAZolam (XANAX) 0.25 MG tablet TAKE 1 TABLET BY MOUTH AT BEDTIME AS NEEDED 90 tablet 1  . B-D INS SYR ULTRAFINE 1CC/31G 31G X 5/16" 1 ML MISC USE AS DIRECTED 100 each 3  . blood glucose meter kit and supplies KIT Dispense based on patient and insurance preference. ICD-10: E11.9 1 each 0  . clindamycin (CLEOCIN) 150 MG capsule Take 1 capsule (150 mg total) by mouth 2 (two) times daily. 14 capsule 0  . glucose blood test strip Use as instructed to check  blood sugar before meals and at bedtime 200 each 12  . Insulin Detemir (LEVEMIR FLEXTOUCH) 100 UNIT/ML Pen Inject 50 units into skin daily at 10 pm 15 mL 11  . Lancets (ONETOUCH ULTRASOFT) lancets Use as instructed 200 each 12  . losartan (COZAAR) 100 MG tablet Take 1 tablet (100 mg total) by mouth daily. 30 tablet 0  . metoCLOPramide (REGLAN) 5 MG tablet TAKE 1 TABLET BY MOUTH 3 TIMES A DAY BEFORE MEALS 270 tablet 1  . mupirocin ointment (BACTROBAN) 2 % Place 1 application into the nose 2 (two) times daily. 22 g 0  . NOVOLOG FLEXPEN 100 UNIT/ML FlexPen INJECT 25 UNITS INTO THE SKIN 3 TIMES DAILY WITH MEALS. 15 pen prn  . pantoprazole (PROTONIX) 40 MG tablet TAKE 1 TABLET EVERY DAY 30 tablet 11  . promethazine (PHENERGAN) 25 MG tablet Take 1 tablet (25 mg total) by mouth every 8 (eight) hours as needed for nausea or vomiting. 20 tablet 0  . rosuvastatin (CRESTOR) 20 MG tablet Take 1 tablet (20 mg total) by mouth daily. 90 tablet 3  . VIAGRA 100 MG tablet TAKE 1 TABLET 1 HOUR BEFORE INTERCOURSE 4 tablet 11   No current facility-administered medications for this visit.     Allergies:   Sulfa antibiotics    Social History:  The patient  reports that he has  quit smoking. He has never used smokeless tobacco. He reports that he does not drink alcohol or use drugs.   Family History:  The patient's family history includes Alzheimer's disease in his father and mother; COPD in his brother; Diabetes in his father and mother; Heart disease in his father; Mental illness in his father.    ROS:  Please see the history of present illness.   Otherwise, review of systems are positive for none.   All other systems are reviewed and negative.    PHYSICAL EXAM: VS:  BP (!) 142/64   Pulse 65   Ht 5' 7" (1.702 m)   Wt 170 lb (77.1 kg)   BMI 26.63 kg/m  , BMI Body mass index is 26.63 kg/m. GEN: Well nourished, well developed, in no acute distress  HEENT: normal  Neck: no JVD, carotid bruits, or  masses Cardiac: RRR; no murmurs, rubs, or gallops,no edema  Respiratory:  clear to auscultation bilaterally, normal work of breathing GI: soft, nontender, nondistended, + BS MS: no deformity or atrophy  Skin: warm and dry, no rash Neuro:  Strength and sensation are intact Psych: euthymic mood, full affect Vascular: Femoral pulses normal bilaterally.  Posterior tibial is +1 bilaterally.  Dorsalis pedis is not palpable.   EKG:  EKG is ordered today. The ekg ordered today demonstrates normal sinus rhythm with no significant T wave changes.   Recent Labs: 12/30/2017: BUN 15; Creat 0.79; Hemoglobin 15.7; Platelets 254; Potassium 5.2; Sodium 138 10/04/2018: ALT 32    Lipid Panel    Component Value Date/Time   CHOL 121 10/04/2018 0934   TRIG 240 (H) 10/04/2018 0934   HDL 43 10/04/2018 0934   CHOLHDL 2.8 10/04/2018 0934   VLDL 49 (H) 03/25/2017 1056   LDLCALC 48 10/04/2018 0934      Wt Readings from Last 3 Encounters:  11/08/18 170 lb (77.1 kg)  10/18/18 170 lb (77.1 kg)  10/06/18 159 lb (72.1 kg)       No flowsheet data found.    ASSESSMENT AND PLAN:  1.  Peripheral arterial disease: Slow healing ulceration on the left big toe seems to be gradually improving.  The patient has mainly below the knee disease with an occluded anterior tibial and peroneal arteries on the left.  His posterior tibial artery seems to be close to normal given that he has a palpable pulse.  His ABI is also close to normal.  He should have reasonable amount of blood flow to heal the ulceration which seems to be improving significantly. If in the other hand this does not heal completely within a month, I will consider left lower extremity angiography. I advised him to start taking aspirin 81 mg once daily. I discussed with him the importance of proper foot hygiene.  2.  Essential hypertension: Blood pressure is reasonably controlled.  3.  Hyperlipidemia: I agree with treatment with rosuvastatin with  a target LDL of less than 70.  Most recent LDL was 48.    Disposition:   FU with me in 1 month  Signed,  Muhammad Arida, MD  11/08/2018 8:34 AM    [facility] 

## 2018-11-08 NOTE — Patient Instructions (Signed)
Medication Instructions:  START aspirin 81 mg daily  If you need a refill on your cardiac medications before your next appointment, please call your pharmacy.   Follow-Up: At [facility], you and your health needs are our priority.  As part of our continuing mission to provide you with exceptional heart care, we have created designated Provider Care Teams.  These Care Teams include your primary Cardiologist (physician) and Advanced Practice Providers (APPs -  Physician Assistants and Nurse Practitioners) who all work together to provide you with the care you need, when you need it. . 1 month with Dr. Fletcher Anon

## 2018-11-14 ENCOUNTER — Ambulatory Visit (INDEPENDENT_AMBULATORY_CARE_PROVIDER_SITE_OTHER): Payer: Medicare HMO | Admitting: Internal Medicine

## 2018-11-14 ENCOUNTER — Encounter: Payer: Self-pay | Admitting: Internal Medicine

## 2018-11-14 VITALS — BP 160/60 | HR 89 | Ht 67.0 in | Wt 172.0 lb

## 2018-11-14 DIAGNOSIS — I10 Essential (primary) hypertension: Secondary | ICD-10-CM | POA: Diagnosis not present

## 2018-11-14 DIAGNOSIS — R030 Elevated blood-pressure reading, without diagnosis of hypertension: Secondary | ICD-10-CM

## 2018-11-14 MED ORDER — HYDROCHLOROTHIAZIDE 25 MG PO TABS: 25.0000 mg | ORAL_TABLET | Freq: Every day | ORAL | 3 refills | Status: DC

## 2018-11-14 NOTE — Progress Notes (Signed)
   Subjective:    Patient ID: ESPN ZEMAN, male    DOB: 10-03-1951, 67 y.o.   MRN: [id number]  HPI Had bilateral ABIs and was placed on ASA 81 mg daily Dr. Fletcher Anon was. with generic Crestor therapy. Situational stress with finances with money loaned to a relative.  Friend passed away. Issues with stepdaughter.Stress with Thanksgiving.  BP remains elevated on increased dose of Losartan 100 mg daily.    Review of Systems tolerating medicine without complaints     Objective:   Physical Exam  I check BP as well and it remains elevated at 160/80 left arm and 180/90 right arm      Assessment & Plan:  Elevated blood pressure readings  Essential hypertension  Situational stress  Plan: Continue losartan 100 mg daily and add HCTZ 25 mg daily.  Follow-up January 3.  He will need basic metabolic panel at that time.  We will not draw this today.

## 2018-11-14 NOTE — Patient Instructions (Signed)
Continue losartan 100 mg daily and add HCTZ 25 mg daily.  Follow-up January 3

## 2018-11-16 ENCOUNTER — Other Ambulatory Visit: Payer: Self-pay | Admitting: Internal Medicine

## 2018-11-22 ENCOUNTER — Other Ambulatory Visit: Payer: Self-pay | Admitting: Internal Medicine

## 2018-11-24 ENCOUNTER — Ambulatory Visit: Payer: Medicare HMO | Admitting: Podiatry

## 2018-11-24 DIAGNOSIS — E08621 Diabetes mellitus due to underlying condition with foot ulcer: Secondary | ICD-10-CM

## 2018-11-24 DIAGNOSIS — L928 Other granulomatous disorders of the skin and subcutaneous tissue: Secondary | ICD-10-CM | POA: Diagnosis not present

## 2018-11-24 DIAGNOSIS — L97521 Non-pressure chronic ulcer of other part of left foot limited to breakdown of skin: Secondary | ICD-10-CM

## 2018-11-29 ENCOUNTER — Ambulatory Visit (INDEPENDENT_AMBULATORY_CARE_PROVIDER_SITE_OTHER): Payer: Medicare HMO | Admitting: Internal Medicine

## 2018-11-29 ENCOUNTER — Encounter: Payer: Self-pay | Admitting: Internal Medicine

## 2018-11-29 VITALS — BP 134/64 | HR 79 | Ht 67.0 in | Wt 166.4 lb

## 2018-11-29 DIAGNOSIS — Z794 Long term (current) use of insulin: Secondary | ICD-10-CM | POA: Diagnosis not present

## 2018-11-29 DIAGNOSIS — E1165 Type 2 diabetes mellitus with hyperglycemia: Secondary | ICD-10-CM

## 2018-11-29 DIAGNOSIS — E1142 Type 2 diabetes mellitus with diabetic polyneuropathy: Secondary | ICD-10-CM | POA: Diagnosis not present

## 2018-11-29 MED ORDER — INSULIN DETEMIR 100 UNIT/ML FLEXPEN: 40.0000 [IU] | PEN_INJECTOR | Freq: Every day | SUBCUTANEOUS | 11 refills | Status: DC

## 2018-11-29 MED ORDER — INSULIN ASPART 100 UNIT/ML FLEXPEN: 15.0000 [IU] | PEN_INJECTOR | Freq: Three times a day (TID) | SUBCUTANEOUS | 99 refills | Status: DC

## 2018-11-29 NOTE — Progress Notes (Signed)
Name: Joseph Dixon  MRN/ DOB: [id number], 23-Dec-1950   Age/ Sex: 67 y.o., male    PCP: Elby Showers, MD   Reason for Endocrinology Evaluation: Type 2 Diabetes Mellitus     Date of Initial Endocrinology Visit: 11/29/2018     PATIENT IDENTIFIER: [name] is a 67 y.o. male with a past medical history of DM, Oartic insufficiency and hyperlipidemia. The patient presented for initial endocrinology clinic visit on 11/29/2018 for consultative assistance with his diabetes management.    HPI: [name] was    Diagnosed with T2DM ~ 10 yrs ago  Prior Medications tried/Intolerance: Does not recall  Currently checking blood sugars 1 x / day.  Hypoglycemia episodes : Yes             Symptoms: weakness, fuzzy vision                Frequency: daily   Hemoglobin A1c has ranged from 8.0% in 2019, peaking at 12.4% in 2016. Patient required assistance for hypoglycemia: once ~ 2-3 yrs ago  Patient has required hospitalization within the last 1 year from hyper or hypoglycemia: no  In terms of diet, the patient avoids Sugar-sweetened beverages, he eats 2-3 meals a day, snacks at times      HOME DIABETES REGIMEN: Levemir 50 units QHS Novolog 25 units daily   Statin: Yes ACE-I/ARB:Yes  Prior Diabetic Education: Yes   METER DOWNLOAD SUMMARY: Did not bring    DIABETIC COMPLICATIONS: Microvascular complications:   Neuropathy   Denies: retinopathy, nephropathy   Last eye exam: Completed 02/2018  Macrovascular complications:   Denies: CAD, PVD, CVA   PAST HISTORY: Past Medical History:  Past Medical History:  Diagnosis Date  . Cancer (HCC)    basal cell both shoulders  . Diabetes mellitus    insulin dependant  . Hyperlipidemia     Past Surgical History:  Past Surgical History:  Procedure Laterality Date  . repair of rt knee laceration        Social History:  reports that he has quit smoking. He has never used smokeless tobacco. He reports that he does not drink  alcohol or use drugs. Family History:  Family History  Problem Relation Age of Onset  . Heart disease Father   . Diabetes Father   . Mental illness Father   . Alzheimer's disease Father   . COPD Brother   . Diabetes Mother   . Alzheimer's disease Mother   . Colon cancer Neg Hx       HOME MEDICATIONS: Allergies as of 11/29/2018      Reactions   Sulfa Antibiotics    hallucinations      Medication List        Accurate as of 11/29/18  9:31 AM. Always use your most recent med list.          ALPRAZolam 0.25 MG tablet Commonly known as:  XANAX TAKE 1 TABLET BY MOUTH AT BEDTIME AS NEEDED   aspirin EC 81 MG tablet Take 1 tablet (81 mg total) by mouth daily.   B-D INS SYR ULTRAFINE 1CC/31G 31G X 5/16" 1 ML Misc Generic drug:  Insulin Syringe-Needle U-100 USE AS DIRECTED   blood glucose meter kit and supplies Kit Dispense based on patient and insurance preference. ICD-10: E11.9   clindamycin 150 MG capsule Commonly known as:  CLEOCIN Take 1 capsule (150 mg total) by mouth 2 (two) times daily.   glucose blood test strip Use as  instructed to check blood sugar before meals and at bedtime   hydrochlorothiazide 25 MG tablet Commonly known as:  HYDRODIURIL Take 1 tablet (25 mg total) by mouth daily.   Insulin Detemir 100 UNIT/ML Pen Commonly known as:  LEVEMIR Inject 50 units into skin daily at 10 pm   losartan 100 MG tablet Commonly known as:  COZAAR TAKE 1 TABLET BY MOUTH EVERY DAY   metoCLOPramide 5 MG tablet Commonly known as:  REGLAN TAKE 1 TABLET BY MOUTH 3 TIMES A DAY BEFORE MEALS   mupirocin ointment 2 % Commonly known as:  BACTROBAN Place 1 application into the nose 2 (two) times daily.   NOVOLOG FLEXPEN 100 UNIT/ML FlexPen Generic drug:  insulin aspart INJECT 25 UNITS INTO THE SKIN 3 TIMES DAILY WITH MEALS.   onetouch ultrasoft lancets Use as instructed   pantoprazole 40 MG tablet Commonly known as:  PROTONIX TAKE 1 TABLET BY MOUTH EVERY DAY    promethazine 25 MG tablet Commonly known as:  PHENERGAN Take 1 tablet (25 mg total) by mouth every 8 (eight) hours as needed for nausea or vomiting.   rosuvastatin 20 MG tablet Commonly known as:  CRESTOR Take 1 tablet (20 mg total) by mouth daily.   VIAGRA 100 MG tablet Generic drug:  sildenafil TAKE 1 TABLET 1 HOUR BEFORE INTERCOURSE        ALLERGIES: Allergies  Allergen Reactions  . Sulfa Antibiotics     hallucinations     REVIEW OF SYSTEMS: A comprehensive ROS was conducted with the patient and is negative except as per HPI and below:  ROS    OBJECTIVE:   VITAL SIGNS: BP (!) 134/4 (BP Location: Right Arm, Patient Position: Sitting, Cuff Size: Normal)   Pulse 79   Ht '5\' 7"'$  (1.702 m)   Wt 166 lb 6.4 oz (75.5 kg)   SpO2 98%   BMI 26.06 kg/m    PHYSICAL EXAM:  General: Pt appears well and is in NAD  Hydration: Well-hydrated with moist mucous membranes and good skin turgor  HEENT: Head: Unremarkable with good dentition. Oropharynx clear without exudate.  Eyes: External eye exam normal without stare, lid lag or exophthalmos.  EOM intact.  PERRL.  Neck: General: Supple without adenopathy or carotid bruits. Thyroid: Thyroid size normal.  No goiter or nodules appreciated. No thyroid bruit.  Lungs: Clear with good BS bilat with no rales, rhonchi, or wheezes  Heart: RRR with normal S1 and S2 and no gallops; no murmurs; no rub  Abdomen: Normoactive bowel sounds, soft, nontender, without masses or organomegaly palpable  Extremities:  Lower extremities - No pretibial edema. No lesions.  Skin: Normal texture and temperature to palpation. No rash noted. No Acanthosis nigricans/skin tags. No lipohypertrophy.  Neuro: MS is good with appropriate affect, pt is alert and Ox3    DM foot exam:  The skin of the feet is intact without sores or ulcerations. The pedal pulses are 2+ on right and 2+ on left. The sensation is intact to a screening 5.07, 10 gram monofilament  bilaterally   DATA REVIEWED:  Lab Results  Component Value Date   HGBA1C 8.3 (H) 10/04/2018   HGBA1C 8.0 (H) 06/06/2018   HGBA1C 8.5 (H) 03/01/2018   Lab Results  Component Value Date   MICROALBUR 1.1 06/06/2018   LDLCALC 48 10/04/2018   CREATININE 0.79 12/30/2017   Lab Results  Component Value Date   MICRALBCREAT 7 06/06/2018    Lab Results  Component Value Date   CHOL  121 10/04/2018   HDL 43 10/04/2018   LDLCALC 48 10/04/2018   TRIG 240 (H) 10/04/2018   CHOLHDL 2.8 10/04/2018        ASSESSMENT / PLAN / RECOMMENDATIONS:   1) Insulin-Dependent Diabetes Mellitus , poorly controlled, With microvascular complications - Most recent A1c of 8.3 %. Goal A1c < 7.0 %.   Plan: GENERAL: I have discussed with the patient the pathophysiology of diabetes. We went over the natural progression of the disease. We talked about both insulin resistance and insulin deficiency. We stressed the importance of lifestyle changes including diet and exercise. I explained the complications associated with diabetes including retinopathy, nephropathy, neuropathy as well as increased risk of cardiovascular disease. We went over the benefit seen with glycemic control.    I explained to the patient that diabetic patients are at higher than normal risk for amputations. The patient was informed that diabetes is the number one cause of non-traumatic amputations in [place].  We discussed the pharmacokinetics of basal/bolus insulin, we discussed the importance of matching prandial insulin with meals.   Advised pt to avoid sugar-sweetened beverages and snacks   MEDICATIONS:  Decrease Levemir 40 units daily  Decrease Novolog 15 units TID QAC  EDUCATION / INSTRUCTIONS:  BG monitoring instructions: Patient is instructed to check his blood sugars 4 times a day, before meals and bedtime.  Call [place] [facility] if: BG persistently < 70 or > 300. . I reviewed the Rule of 15 for the treatment of  hypoglycemia in detail with the patient. Literature supplied.   2) Diabetic complications:   Eye: Does not have known diabetic retinopathy. Last eye exam was   Neuro/ Feet: Does have known diabetic peripheral neuropathy.  Renal: Patient does  have known baseline CKD. He is  on an ACEI/ARB at present.   3) Lipids: Patient is on a statin.    4) Hypertension: He is at goal of < 140/90 mmHg.       Signed electronically by: Mack Guise, MD  [facility]  [facility] [address] [place], [place] [address] Phone: [phone] FAX: [phone]   CC: Elby Showers, MD [address] [place] [address] Phone: [phone]  Fax: [phone]    Return to Endocrinology clinic as below: Future Appointments  Date Time Provider Guadalupe Guerra  12/06/2018  8:00 AM Wellington Hampshire, MD [facility]  12/22/2018  8:45 AM Evelina Bucy, DPM [facility]  12/23/2018  9:45 AM Baxley, Cresenciano Lick, MD [facility]

## 2018-11-29 NOTE — Patient Instructions (Addendum)
-   Check your sugar before each meal and bedtime  - Decrease Levemir to 40 units  At bedtime - Decrease Novolog to 15 units with each meal  - HOW TO TREAT LOW BLOOD SUGARS (Blood sugar LESS THAN 70 MG/DL)  Please follow the RULE OF 15 for the treatment of hypoglycemia treatment (when your (blood sugars are less than 70 mg/dL)    STEP 1: Take 15 grams of carbohydrates when your blood sugar is low, which includes:   3-4 GLUCOSE TABS  OR  3-4 OZ OF JUICE OR REGULAR SODA OR  ONE TUBE OF GLUCOSE GEL     STEP 2: RECHECK blood sugar in 15 MINUTES STEP 3: If your blood sugar is still low at the 15 minute recheck --> then, go back to STEP 1 and treat AGAIN with another 15 grams of carbohydrates.  - Choose healthy, lower carb lower calorie snacks: toss salad, cooked vegetables, cottage cheese, peanut butter, low fat cheese / string cheese, lower sodium deli meat, tuna salad or chicken salad

## 2018-12-02 ENCOUNTER — Other Ambulatory Visit: Payer: Self-pay | Admitting: Internal Medicine

## 2018-12-06 ENCOUNTER — Ambulatory Visit: Payer: Medicare HMO | Admitting: Cardiovascular Disease

## 2018-12-06 VITALS — BP 118/69 | HR 75 | Ht 68.0 in | Wt 167.0 lb

## 2018-12-06 DIAGNOSIS — E785 Hyperlipidemia, unspecified: Secondary | ICD-10-CM

## 2018-12-06 DIAGNOSIS — I739 Peripheral vascular disease, unspecified: Secondary | ICD-10-CM

## 2018-12-06 DIAGNOSIS — I10 Essential (primary) hypertension: Secondary | ICD-10-CM

## 2018-12-06 NOTE — Patient Instructions (Signed)
Medication Instructions:  Your physician recommends that you continue on your current medications as directed. Please refer to the Current Medication list given to you today.   If you need a refill on your cardiac medications before your next appointment, please call your pharmacy.   Lab work: None ordered If you have labs (blood work) drawn today and your tests are completely normal, you will receive your results only by: Marland Kitchen MyChart Message (if you have MyChart) OR . A paper copy in the mail If you have any lab test that is abnormal or we need to change your treatment, we will call you to review the results.  Testing/Procedures: None ordered  Follow-Up: At [facility], you and your health needs are our priority.  As part of our continuing mission to provide you with exceptional heart care, we have created designated Provider Care Teams.  These Care Teams include your primary Cardiologist (physician) and Advanced Practice Providers (APPs -  Physician Assistants and Nurse Practitioners) who all work together to provide you with the care you need, when you need it. . Your physician recommends that you schedule a follow-up appointment in: 4 months with Dr.Arida   Any Other Special Instructions Will Be Listed Below (If Applicable).

## 2018-12-06 NOTE — Progress Notes (Signed)
Cardiology Office Note   Date:  12/06/2018   ID:  Joseph Dixon, DOB 03-30-1951, MRN [id number]  PCP:  Elby Showers, MD  Cardiologist:   Kathlyn Sacramento, MD   No chief complaint on file.     History of Present Illness: Joseph Dixon is a 67 y.o. male who is here today  for evaluation and management of peripheral arterial disease.  The patient has known history of diabetes mellitus, hypertension and hyperlipidemia.  He has no previous cardiac history and no prior history of peripheral arterial disease.  He is not a smoker and has no family history of premature coronary artery disease. The patient was seen for a slow healing  small ulceration on the left big to. He underwent noninvasive vascular evaluation which showed near normal ABI.  Duplex showed mainly tibial disease with an occluded anterior tibial artery on the right and an occluded anterior tibial and peroneal arteries on the left. Given the duplex finding, no angiography was performed. The ulceration has improved significantly and almost completely healed. He continues to deny claudication.  No chest pain or shortness of breath.    Past Medical History:  Diagnosis Date  . Cancer (HCC)    basal cell both shoulders  . Diabetes mellitus    insulin dependant  . Hyperlipidemia     Past Surgical History:  Procedure Laterality Date  . repair of rt knee laceration       Current Outpatient Medications  Medication Sig Dispense Refill  . ALPRAZolam (XANAX) 0.25 MG tablet TAKE 1 TABLET BY MOUTH AT BEDTIME 90 tablet 0  . aspirin EC 81 MG tablet Take 1 tablet (81 mg total) by mouth daily. 90 tablet 3  . B-D INS SYR ULTRAFINE 1CC/31G 31G X 5/16" 1 ML MISC USE AS DIRECTED (Patient taking differently: 1 each by Does not apply route 5 (five) times daily. Use to inject insulin 5 times per day) 100 each 3  . blood glucose meter kit and supplies KIT Dispense based on patient and insurance preference. ICD-10: E11.9 1 each 0  .  clindamycin (CLEOCIN) 150 MG capsule Take 1 capsule (150 mg total) by mouth 2 (two) times daily. 14 capsule 0  . glucose blood test strip Use as instructed to check blood sugar before meals and at bedtime (Patient taking differently: 1 each by Other route 2 (two) times daily. Use as instructed to check blood sugar before meals and at bedtime) 200 each 12  . hydrochlorothiazide (HYDRODIURIL) 25 MG tablet Take 1 tablet (25 mg total) by mouth daily. 30 tablet 3  . insulin aspart (NOVOLOG FLEXPEN) 100 UNIT/ML FlexPen Inject 15 Units into the skin 3 (three) times daily with meals. 15 pen prn  . Insulin Detemir (LEVEMIR FLEXTOUCH) 100 UNIT/ML Pen Inject 40 Units into the skin at bedtime. Inject 50 units into skin daily at 10 pm 15 mL 11  . Lancets (ONETOUCH ULTRASOFT) lancets Use as instructed 200 each 12  . losartan (COZAAR) 100 MG tablet TAKE 1 TABLET BY MOUTH EVERY DAY 30 tablet 0  . metoCLOPramide (REGLAN) 5 MG tablet TAKE 1 TABLET BY MOUTH 3 TIMES A DAY BEFORE MEALS 270 tablet 1  . mupirocin ointment (BACTROBAN) 2 % Place 1 application into the nose 2 (two) times daily. 22 g 0  . pantoprazole (PROTONIX) 40 MG tablet TAKE 1 TABLET BY MOUTH EVERY DAY 90 tablet 3  . promethazine (PHENERGAN) 25 MG tablet Take 1 tablet (25 mg total) by mouth  every 8 (eight) hours as needed for nausea or vomiting. 20 tablet 0  . rosuvastatin (CRESTOR) 20 MG tablet Take 1 tablet (20 mg total) by mouth daily. 90 tablet 3  . VIAGRA 100 MG tablet TAKE 1 TABLET 1 HOUR BEFORE INTERCOURSE 4 tablet 11   No current facility-administered medications for this visit.     Allergies:   Sulfa antibiotics    Social History:  The patient  reports that he has quit smoking. He has never used smokeless tobacco. He reports that he does not drink alcohol or use drugs.   Family History:  The patient's family history includes Alzheimer's disease in his father and mother; COPD in his brother; Diabetes in his father and mother; Heart disease  in his father; Mental illness in his father.    ROS:  Please see the history of present illness.   Otherwise, review of systems are positive for none.   All other systems are reviewed and negative.    PHYSICAL EXAM: VS:  BP 118/69   Pulse 75   Ht _0  (1.727 m)   Wt 167 lb (75.8 kg)   BMI 25.39 kg/m  , BMI Body mass index is 25.39 kg/m. GEN: Well nourished, well developed, in no acute distress  HEENT: normal  Neck: no JVD, carotid bruits, or masses Cardiac: RRR; no murmurs, rubs, or gallops,no edema  Respiratory:  clear to auscultation bilaterally, normal work of breathing GI: soft, nontender, nondistended, + BS MS: no deformity or atrophy  Skin: warm and dry, no rash Neuro:  Strength and sensation are intact Psych: euthymic mood, full affect Vascular: Femoral pulses normal bilaterally.  Posterior tibial is +1 bilaterally.  Dorsalis pedis is not palpable.   EKG:  EKG is not ordered today.   Recent Labs: 12/30/2017: BUN 15; Creat 0.79; Hemoglobin 15.7; Platelets 254; Potassium 5.2; Sodium 138 10/04/2018: ALT 32    Lipid Panel    Component Value Date/Time   CHOL 121 10/04/2018 0934   TRIG 240 (H) 10/04/2018 0934   HDL 43 10/04/2018 0934   CHOLHDL 2.8 10/04/2018 0934   VLDL 49 (H) 03/25/2017 1056   LDLCALC 48 10/04/2018 0934      Wt Readings from Last 3 Encounters:  12/06/18 167 lb (75.8 kg)  11/29/18 166 lb 6.4 oz (75.5 kg)  11/14/18 172 lb (78 kg)       No flowsheet data found.    ASSESSMENT AND PLAN:  1.  Peripheral arterial disease: Small ulceration on the left big toe almost completely healed.  The patient has no claudication.   The patient has mainly below the knee disease with an occluded anterior tibial and peroneal arteries on the left.  His posterior tibial artery seems to be close to normal given that he has a palpable pulse.  His ABI was also close to normal.   Continue medical therapy for now and if there is worsening or recurrent ulceration, we  can proceed with angiography.  2.  Essential hypertension: Blood pressure is reasonably controlled.  3.  Hyperlipidemia: I agree with treatment with rosuvastatin with a target LDL of less than 70.  Most recent LDL was 48.    Disposition:   FU with me in 4 months  Signed,  Kathlyn Sacramento, MD  12/06/2018 3:44 PM    [place]

## 2018-12-14 ENCOUNTER — Other Ambulatory Visit: Payer: Self-pay | Admitting: Internal Medicine

## 2018-12-20 ENCOUNTER — Ambulatory Visit (INDEPENDENT_AMBULATORY_CARE_PROVIDER_SITE_OTHER): Payer: Medicare HMO | Admitting: Internal Medicine

## 2018-12-20 ENCOUNTER — Encounter: Payer: Self-pay | Admitting: Internal Medicine

## 2018-12-20 VITALS — BP 122/60 | HR 78 | Ht 68.0 in | Wt 163.0 lb

## 2018-12-20 DIAGNOSIS — Z794 Long term (current) use of insulin: Secondary | ICD-10-CM | POA: Diagnosis not present

## 2018-12-20 DIAGNOSIS — E1165 Type 2 diabetes mellitus with hyperglycemia: Secondary | ICD-10-CM | POA: Diagnosis not present

## 2018-12-20 DIAGNOSIS — E1142 Type 2 diabetes mellitus with diabetic polyneuropathy: Secondary | ICD-10-CM | POA: Diagnosis not present

## 2018-12-20 MED ORDER — INSULIN ASPART 100 UNIT/ML FLEXPEN: 18.0000 [IU] | PEN_INJECTOR | Freq: Three times a day (TID) | SUBCUTANEOUS | 6 refills | Status: DC

## 2018-12-20 MED ORDER — BASAGLAR KWIKPEN 100 UNIT/ML ~~LOC~~ SOPN: 45.0000 [IU] | PEN_INJECTOR | Freq: Every day | SUBCUTANEOUS | 6 refills | Status: DC

## 2018-12-20 NOTE — Progress Notes (Addendum)
Name: Joseph Dixon  Age/ Sex: 67 y.o., male   MRN/ DOB: [id number], 1951-12-02     PCP: Elby Showers, MD   Reason for Endocrinology Evaluation: Type 2 Diabetes Mellitus  Initial Endocrine Consultative Visit: 11/29/2018    PATIENT IDENTIFIER: [name] is a 68 y.o. male with a past medical history of DM, Oartic insufficiency and hyperlipidemia . The patient has followed with Endocrinology clinic since 11/29/2018 for consultative assistance with management of his diabetes.  DIABETIC HISTORY:  Mr. Venson was diagnosed with T2DM ~ 2009, he does not recall any anti-glycemic agents he has been on, he has been on insulin for many years.  His hemoglobin A1c has ranged from 8.0% in 2019, peaking at 12.4% in 2016.   Pt with chronic intermittent Hx of diarrhea. That at times he attributes to Hx  ETOH intake.   SUBJECTIVE:   During the last visit (11/29/2018): Pt was complaining of daily hypoglycemia, and since he did not bring his glucose readings at the time, I reduced his basal and prandial dosing.   Today (12/20/2018): [name] is here for a 3 week follow up on his diabetes management.  He checks his blood sugars 3 times daily, preprandial. The patient has not had hypoglycemic episodes since the last clinic visit, which typically occur 1 x / daily. The patient is  symptomatic with these episodes, with symptoms of dizziness and shakiness. Otherwise, the patient has not required any recent emergency interventions for hypoglycemia and has not had recent hospitalizations secondary to hyper or hypoglycemic episodes.    ROS: As per HPI and as detailed below: Review of Systems  Constitutional: Negative for fever and weight loss.  HENT: Negative for congestion and sore throat.   Respiratory: Negative for cough and shortness of breath.   Cardiovascular: Negative for chest pain and palpitations.      HOME  DIABETES REGIMEN:  Levemir 40 units daily Novolog 15 units TID QAC   GLUCOSE LOG :  Date Breakfast  Lunch  Supper   12/15/18 393  294  12/16/18 176 243 219  12/17/18 230        HISTORY:  Past Medical History:  Past Medical History:  Diagnosis Date  . Cancer (HCC)    basal cell both shoulders  . Diabetes mellitus    insulin dependant  . Hyperlipidemia     Past Surgical History:  Past Surgical History:  Procedure Laterality Date  . repair of rt knee laceration       Social History:  reports that he has quit smoking. He has never used smokeless tobacco. He reports that he does not drink alcohol or use drugs. Family History:  Family History  Problem Relation Age of Onset  . Heart disease Father   . Diabetes Father   . Mental illness Father   . Alzheimer's disease Father   . COPD Brother   . Diabetes Mother   . Alzheimer's disease Mother   . Colon cancer Neg Hx       HOME MEDICATIONS: Allergies as of 12/20/2018      Reactions   Sulfa Antibiotics    hallucinations      Medication List       Accurate as of December 20, 2018 10:24 AM. Always use your most recent med list.        ALPRAZolam 0.25 MG tablet Commonly known as:  XANAX TAKE 1 TABLET BY MOUTH AT BEDTIME   aspirin EC 81 MG tablet  Take 1 tablet (81 mg total) by mouth daily.   B-D INS SYR ULTRAFINE 1CC/31G 31G X 5/16" 1 ML Misc Generic drug:  Insulin Syringe-Needle U-100 USE AS DIRECTED   blood glucose meter kit and supplies Kit Dispense based on patient and insurance preference. ICD-10: E11.9   clindamycin 150 MG capsule Commonly known as:  CLEOCIN Take 1 capsule (150 mg total) by mouth 2 (two) times daily.   glucose blood test strip Use as instructed to check blood sugar before meals and at bedtime   hydrochlorothiazide 25 MG tablet Commonly known as:  HYDRODIURIL Take 1 tablet (25 mg total) by mouth daily.   insulin aspart 100 UNIT/ML FlexPen Commonly known as:  NOVOLOG  FLEXPEN Inject 15 Units into the skin 3 (three) times daily with meals.   Insulin Detemir 100 UNIT/ML Pen Commonly known as:  LEVEMIR FLEXTOUCH Inject 40 Units into the skin at bedtime. Inject 50 units into skin daily at 10 pm   losartan 100 MG tablet Commonly known as:  COZAAR TAKE 1 TABLET BY MOUTH EVERY DAY   metoCLOPramide 5 MG tablet Commonly known as:  REGLAN TAKE 1 TABLET BY MOUTH 3 TIMES A DAY BEFORE MEALS   mupirocin ointment 2 % Commonly known as:  BACTROBAN Place 1 application into the nose 2 (two) times daily.   onetouch ultrasoft lancets Use as instructed   pantoprazole 40 MG tablet Commonly known as:  PROTONIX TAKE 1 TABLET BY MOUTH EVERY DAY   promethazine 25 MG tablet Commonly known as:  PHENERGAN Take 1 tablet (25 mg total) by mouth every 8 (eight) hours as needed for nausea or vomiting.   rosuvastatin 20 MG tablet Commonly known as:  CRESTOR Take 1 tablet (20 mg total) by mouth daily.   VIAGRA 100 MG tablet Generic drug:  sildenafil TAKE 1 TABLET 1 HOUR BEFORE INTERCOURSE        OBJECTIVE:   Vital Signs: BP 122/60 (BP Location: Left Arm, Patient Position: Sitting, Cuff Size: Normal)   Pulse 78   Ht '5\' 8"'$  (1.727 m)   Wt 163 lb (73.9 kg)   BMI 24.78 kg/m   Wt Readings from Last 3 Encounters:  12/20/18 163 lb (73.9 kg)  12/06/18 167 lb (75.8 kg)  11/29/18 166 lb 6.4 oz (75.5 kg)     Exam: General: Pt appears well and is in NAD  Hydration: Well-hydrated with moist mucous membranes and good skin turgor  Lungs: Clear with good BS bilat with no rales, rhonchi, or wheezes  Heart: RRR with normal S1 and S2 and no gallops; no murmurs; no rub  Abdomen: Normoactive bowel sounds, soft, nontender, without masses or organomegaly palpable  Extremities: No pretibial edema. No tremor.   Neuro: MS is good with appropriate affect, pt is alert and Ox3    DATA REVIEWED:  Lab Results  Component Value Date   HGBA1C 8.3 (H) 10/04/2018   HGBA1C 8.0 (H)  06/06/2018   HGBA1C 8.5 (H) 03/01/2018   Lab Results  Component Value Date   MICROALBUR 1.1 06/06/2018   LDLCALC 48 10/04/2018   CREATININE 0.79 12/30/2017   Lab Results  Component Value Date   MICRALBCREAT 7 06/06/2018     Lab Results  Component Value Date   CHOL 121 10/04/2018   HDL 43 10/04/2018   LDLCALC 48 10/04/2018   TRIG 240 (H) 10/04/2018   CHOLHDL 2.8 10/04/2018         ASSESSMENT / PLAN / RECOMMENDATIONS:   1) Type 2  Diabetes Mellitus, Poorly controlled, With neuropathic complications - Most recent A1c of 8.3 %. Goal A1c < 7.0 %.   Plan:  - Hyperglycemia noted through the day and night , worse at night, this is partly due to levemir being an 18 hr basal rather then a 24 hr basal insulin. Will switch to [place] for better consistency of basal insulin - Will increase insulin doses as below  - Pt avoids sugar-sweetened beverages and snacks between the meals.  - We discussed adding an insulin sensitizer such as Metformin. But patient started telling me about a long intermittent H of GI issues, so will avoid this.    MEDICATIONS:  Stop Levemir  Start Basaglar at 45 units daily   Increase Novolog to 18 units TID QAC  EDUCATION / INSTRUCTIONS:  BG monitoring instructions: Patient is instructed to check his blood sugars 4 times a day, before meals and bedtime  Call [place] [facility] if: BG persistently < 70 or > 300. . I reviewed the Rule of 15 for the treatment of hypoglycemia in detail with the patient. Literature supplied.   F/U in 8 weeks     Signed electronically by: Mack Guise, MD  [facility]  [facility] [address] [place], [place] [address] Phone: [phone] FAX: [phone]   CC: Elby Showers, MD [address] [place] [address] Phone: [phone]  Fax: [phone]  Return to Endocrinology clinic as below: Future Appointments  Date Time Provider  [place]  12/22/2018  8:45 AM Evelina Bucy, DPM [facility]  12/23/2018  9:45 AM Bax[place], Cresenciano Lick, MD [facility]

## 2018-12-20 NOTE — Patient Instructions (Addendum)
-   STOP Levemir  - Start Basaglar 45 units  - Increase Novolog to 18 units with each meal  - Continue to check your sugars before each meal and bedtime

## 2018-12-22 ENCOUNTER — Encounter: Payer: Self-pay | Admitting: Podiatry

## 2018-12-22 ENCOUNTER — Ambulatory Visit: Payer: Medicare HMO | Admitting: Podiatry

## 2018-12-22 DIAGNOSIS — L97521 Non-pressure chronic ulcer of other part of left foot limited to breakdown of skin: Secondary | ICD-10-CM | POA: Diagnosis not present

## 2018-12-22 DIAGNOSIS — E08621 Diabetes mellitus due to underlying condition with foot ulcer: Secondary | ICD-10-CM

## 2018-12-22 NOTE — Progress Notes (Signed)
Subjective:  Patient ID: Joseph Dixon, male    DOB: May 26, 1951,  MRN: [id number]  Chief Complaint  Patient presents with  . Wound Check    Follow-up; Left foot; great toe-top of toe (medial side); pt stated, "I feel like I am slowly getting better"; pt Diabetic Type 2; Sugar=did not check today; A1C=8.3 (10/04/18)    68 y.o. male presents for wound care. States he tried attempted to remove some of the callus on his toe himself.  Review of Systems: Negative except as noted in the HPI. Denies N/V/F/Ch.  Past Medical History:  Diagnosis Date  . Cancer (HCC)    basal cell both shoulders  . Diabetes mellitus    insulin dependant  . Hyperlipidemia     Current Outpatient Medications:  .  ALPRAZolam (XANAX) 0.25 MG tablet, TAKE 1 TABLET BY MOUTH AT BEDTIME, Disp: 90 tablet, Rfl: 0 .  aspirin EC 81 MG tablet, Take 1 tablet (81 mg total) by mouth daily., Disp: 90 tablet, Rfl: 3 .  B-D INS SYR ULTRAFINE 1CC/31G 31G X 5/16" 1 ML MISC, USE AS DIRECTED (Patient taking differently: 1 each by Does not apply route 5 (five) times daily. Use to inject insulin 5 times per day), Disp: 100 each, Rfl: 3 .  blood glucose meter kit and supplies KIT, Dispense based on patient and insurance preference. ICD-10: E11.9, Disp: 1 each, Rfl: 0 .  clindamycin (CLEOCIN) 150 MG capsule, Take 1 capsule (150 mg total) by mouth 2 (two) times daily., Disp: 14 capsule, Rfl: 0 .  glucose blood test strip, Use as instructed to check blood sugar before meals and at bedtime (Patient taking differently: 1 each by Other route 2 (two) times daily. Use as instructed to check blood sugar before meals and at bedtime), Disp: 200 each, Rfl: 12 .  hydrochlorothiazide (HYDRODIURIL) 25 MG tablet, Take 1 tablet (25 mg total) by mouth daily., Disp: 30 tablet, Rfl: 3 .  insulin aspart (NOVOLOG FLEXPEN) 100 UNIT/ML FlexPen, Inject 18 Units into the skin 3 (three) times daily with meals., Disp: 15 mL, Rfl: 6 .  Insulin Glargine (BASAGLAR  KWIKPEN) 100 UNIT/ML SOPN, Inject 0.45 mLs (45 Units total) into the skin daily., Disp: 15 mL, Rfl: 6 .  Lancets (ONETOUCH ULTRASOFT) lancets, Use as instructed, Disp: 200 each, Rfl: 12 .  losartan (COZAAR) 100 MG tablet, TAKE 1 TABLET BY MOUTH EVERY DAY, Disp: 90 tablet, Rfl: 0 .  metoCLOPramide (REGLAN) 5 MG tablet, TAKE 1 TABLET BY MOUTH 3 TIMES A DAY BEFORE MEALS, Disp: 270 tablet, Rfl: 1 .  mupirocin ointment (BACTROBAN) 2 %, Place 1 application into the nose 2 (two) times daily., Disp: 22 g, Rfl: 0 .  pantoprazole (PROTONIX) 40 MG tablet, TAKE 1 TABLET BY MOUTH EVERY DAY, Disp: 90 tablet, Rfl: 3 .  promethazine (PHENERGAN) 25 MG tablet, Take 1 tablet (25 mg total) by mouth every 8 (eight) hours as needed for nausea or vomiting., Disp: 20 tablet, Rfl: 0 .  rosuvastatin (CRESTOR) 20 MG tablet, Take 1 tablet (20 mg total) by mouth daily., Disp: 90 tablet, Rfl: 3 .  VIAGRA 100 MG tablet, TAKE 1 TABLET 1 HOUR BEFORE INTERCOURSE, Disp: 4 tablet, Rfl: 11  Social History   Tobacco Use  Smoking Status Former Smoker  Smokeless Tobacco Never Used  Tobacco Comment   quit 10 years ago    Allergies  Allergen Reactions  . Sulfa Antibiotics     hallucinations   Objective:  There were no vitals filed  for this visit. There is no height or weight on file to calculate BMI. Constitutional Well developed. Well nourished.  Vascular Dorsalis pedis pulses palpable bilaterally. Posterior tibial pulses palpable bilaterally. Capillary refill normal to all digits.  No cyanosis or clubbing noted. Pedal hair growth normal.  Neurologic Normal speech. Oriented to person, place, and time. Protective sensation absent  Dermatologic Wound Location: [place] Wound Base: Granular/Healthy Peri-wound: Reddened, Calloused Exudate: None: wound tissue dry Wound Measurements: -0.5x0.5  0.8x0.8x0.1  Orthopedic: No pain to palpation either foot.   Radiographs: None today Assessment:   1. Diabetic ulcer of  toe of left foot associated with diabetes mellitus due to underlying condition, limited to breakdown of skin [facility])    Plan:  Patient was evaluated and treated and all questions answered.  Ulcer L Great Toe -Debridement as below. -Dressed with silvadene, DSD. -Continue off-loading with surgical shoe.  Procedure: Selective Debridement of Wound Rationale: Removal of devitalized tissue from the wound to promote healing.  Pre-Debridement Wound Measurements: 0.8 cm x 0.8 cm x 0.1 cm  Post-Debridement Wound Measurements: same as pre-debridement. Type of Debridement: sharp selective Tissue Removed: Devitalized soft-tissue Dressing: Dry, sterile, compression dressing. Disposition: Patient tolerated procedure well. Patient to return in 1 week for follow-up.     No follow-ups on file.

## 2018-12-23 ENCOUNTER — Ambulatory Visit (INDEPENDENT_AMBULATORY_CARE_PROVIDER_SITE_OTHER): Payer: Medicare HMO | Admitting: Internal Medicine

## 2018-12-23 ENCOUNTER — Ambulatory Visit
Admission: RE | Admit: 2018-12-23 | Discharge: 2018-12-23 | Disposition: A | Discharge status: Home / Self Care | Payer: Medicare HMO | Source: Ambulatory Visit | Attending: Internal Medicine | Admitting: Internal Medicine

## 2018-12-23 ENCOUNTER — Encounter: Payer: Self-pay | Admitting: Internal Medicine

## 2018-12-23 VITALS — BP 110/60 | HR 71 | Ht 68.0 in | Wt 160.0 lb

## 2018-12-23 DIAGNOSIS — E785 Hyperlipidemia, unspecified: Secondary | ICD-10-CM

## 2018-12-23 DIAGNOSIS — Z794 Long term (current) use of insulin: Secondary | ICD-10-CM | POA: Diagnosis not present

## 2018-12-23 DIAGNOSIS — L97921 Non-pressure chronic ulcer of unspecified part of left lower leg limited to breakdown of skin: Secondary | ICD-10-CM | POA: Diagnosis not present

## 2018-12-23 DIAGNOSIS — I10 Essential (primary) hypertension: Secondary | ICD-10-CM

## 2018-12-23 DIAGNOSIS — L97512 Non-pressure chronic ulcer of other part of right foot with fat layer exposed: Secondary | ICD-10-CM | POA: Diagnosis not present

## 2018-12-23 DIAGNOSIS — E119 Type 2 diabetes mellitus without complications: Secondary | ICD-10-CM | POA: Diagnosis not present

## 2018-12-23 DIAGNOSIS — IMO0001 Reserved for inherently not codable concepts without codable children: Secondary | ICD-10-CM

## 2018-12-23 NOTE — Progress Notes (Signed)
   Subjective:    Patient ID: Joseph Dixon, male    DOB: 1951/02/18, 68 y.o.   MRN: [id number]  HPI Ulcer left great toe being treated by podiatrist. Will get Xray today to rule out osteomyelitis. BP under good control today.HCTZ added in November. Feels OK. Hgb AIC 8.3 in November.Has been referred to Endocrinologist Novolog increased to 18 units 3 times daily and was placed on Basaglar instead of Levemir. Recent ABI was close to normal. He saw Dr. Jenna Luo. Angiography not recommended unless ulceration not healing.    Review of Systems see above     Objective:   Physical Exam Neck supple. Chest clear. Cor RRR       Assessment & Plan:  Ulceration great toe improved  Poorly controlled DM now on Basaglar  HTN- finally under good control on current regimen  Hyperlipidemia- treated with statin   Plan: continue current meds and RTC April for annual CPE and medicare wellness exam

## 2019-01-05 ENCOUNTER — Other Ambulatory Visit: Payer: Self-pay

## 2019-01-05 MED ORDER — INSULIN PEN NEEDLE 32G X 4 MM MISC: 1.0000 "pen " | Freq: Every day | 6 refills | Status: DC

## 2019-01-09 NOTE — Patient Instructions (Addendum)
Follow up with podiatrist regarding toe ulcer.  Continue to see endocrinologist regarding diabetic control.  Follow-up here in April for Medicare wellness visit and annual physical exam.  Continue same antihypertensive medications.

## 2019-01-10 ENCOUNTER — Ambulatory Visit: Payer: Medicare HMO | Admitting: Internal Medicine

## 2019-01-14 ENCOUNTER — Other Ambulatory Visit: Payer: Self-pay | Admitting: Internal Medicine

## 2019-01-15 NOTE — Progress Notes (Signed)
Subjective:  Patient ID: Joseph Dixon, male    DOB: 12/15/1951,  MRN: [id number]  Chief Complaint  Patient presents with  . Diabetic Ulcer    left great toe - looks great    68 y.o. male presents for wound care.  Thinks ulceration is doing very well denies other concerns  Review of Systems: Negative except as noted in the HPI. Denies N/V/F/Ch.  Past Medical History:  Diagnosis Date  . Cancer (HCC)    basal cell both shoulders  . Diabetes mellitus    insulin dependant  . Hyperlipidemia     Current Outpatient Medications:  .  ALPRAZolam (XANAX) 0.25 MG tablet, TAKE 1 TABLET BY MOUTH AT BEDTIME, Disp: 90 tablet, Rfl: 0 .  aspirin EC 81 MG tablet, Take 1 tablet (81 mg total) by mouth daily., Disp: 90 tablet, Rfl: 3 .  B-D INS SYR ULTRAFINE 1CC/31G 31G X 5/16" 1 ML MISC, USE AS DIRECTED (Patient taking differently: 1 each by Does not apply route 5 (five) times daily. Use to inject insulin 5 times per day), Disp: 100 each, Rfl: 3 .  blood glucose meter kit and supplies KIT, Dispense based on patient and insurance preference. ICD-10: E11.9, Disp: 1 each, Rfl: 0 .  glucose blood test strip, Use as instructed to check blood sugar before meals and at bedtime (Patient taking differently: 1 each by Other route 2 (two) times daily. Use as instructed to check blood sugar before meals and at bedtime), Disp: 200 each, Rfl: 12 .  hydrochlorothiazide (HYDRODIURIL) 25 MG tablet, Take 1 tablet (25 mg total) by mouth daily., Disp: 30 tablet, Rfl: 3 .  insulin aspart (NOVOLOG FLEXPEN) 100 UNIT/ML FlexPen, Inject 18 Units into the skin 3 (three) times daily with meals., Disp: 15 mL, Rfl: 6 .  Insulin Glargine (BASAGLAR KWIKPEN) 100 UNIT/ML SOPN, Inject 0.45 mLs (45 Units total) into the skin daily., Disp: 15 mL, Rfl: 6 .  Insulin Pen Needle 32G X 4 MM MISC, 1 pen by Does not apply route daily., Disp: 100 each, Rfl: 6 .  Lancets (ONETOUCH ULTRASOFT) lancets, Use as instructed, Disp: 200 each, Rfl: 12 .   losartan (COZAAR) 100 MG tablet, TAKE 1 TABLET BY MOUTH EVERY DAY, Disp: 90 tablet, Rfl: 0 .  metoCLOPramide (REGLAN) 5 MG tablet, TAKE 1 TABLET BY MOUTH 3 TIMES A DAY BEFORE MEALS, Disp: 270 tablet, Rfl: 1 .  mupirocin ointment (BACTROBAN) 2 %, Place 1 application into the nose 2 (two) times daily., Disp: 22 g, Rfl: 0 .  pantoprazole (PROTONIX) 40 MG tablet, TAKE 1 TABLET BY MOUTH EVERY DAY, Disp: 90 tablet, Rfl: 3 .  rosuvastatin (CRESTOR) 20 MG tablet, TAKE 1 TABLET BY MOUTH EVERY DAY, Disp: 90 tablet, Rfl: 3 .  VIAGRA 100 MG tablet, TAKE 1 TABLET 1 HOUR BEFORE INTERCOURSE, Disp: 4 tablet, Rfl: 11  Social History   Tobacco Use  Smoking Status Former Smoker  Smokeless Tobacco Never Used  Tobacco Comment   quit 10 years ago    Allergies  Allergen Reactions  . Sulfa Antibiotics     hallucinations   Objective:  There were no vitals filed for this visit. There is no height or weight on file to calculate BMI. Constitutional Well developed. Well nourished.  Vascular Dorsalis pedis pulses palpable bilaterally. Posterior tibial pulses palpable bilaterally. Capillary refill normal to all digits.  No cyanosis or clubbing noted. Pedal hair growth normal.  Neurologic Normal speech. Oriented to person, place, and time. Protective sensation absent  Dermatologic Wound Location: L hallux Wound Base: Granular/Healthy Peri-wound: Reddened, Calloused Exudate: None: wound tissue dry Wound Measurements: -0.2x0.2  Orthopedic: No pain to palpation either foot.   Radiographs: None today Assessment:   1. Diabetic ulcer of toe of left foot associated with diabetes mellitus due to underlying condition, limited to breakdown of skin ([place])   2. Other granulomatous disorders of the skin and subcutaneous tissue    Plan:  Patient was evaluated and treated and all questions answered.  Ulcer L Great Toe -Wound base cauterized with silver nitrate to promote epithelialization -Dressed with  silvadene, DSD. -Continue off-loading with surgical shoe.  Procedure: Chemical Cauterization of Granulation Tissue Rationale: Cauterize granular wound base to promote healing.  Wound Measurements: 0.2 cm x 0.2 cm x 0.1 cm  Instrumentation: Silver nitrate stick x3 Dressing: Dry, sterile, compression dressing. Disposition: Patient tolerated procedure well. Patient to return in 1 week for follow-up.  No follow-ups on file.

## 2019-01-15 NOTE — Progress Notes (Signed)
Subjective:  Patient ID: Joseph Dixon, male    DOB: [birth year],  MRN: [id number]  Chief Complaint  Patient presents with  . Blister    L hallux medial side x 3 wks and L 2nd toe x 1 wk; no pain Pt. stated," it started as a blister now it's just scabbed." -pt denies drainage -w/ light redness Tx:  abx ointment, guaze and bandaid    68 y.o. male presents with the above complaint. Reports ulcerations as above.   Review of Systems: Negative except as noted in the HPI. Denies N/V/F/Ch.  Past Medical History:  Diagnosis Date  . Cancer (HCC)    basal cell both shoulders  . Diabetes mellitus    insulin dependant  . Hyperlipidemia     Current Outpatient Medications:  .  B-D INS SYR ULTRAFINE 1CC/31G 31G X 5/16" 1 ML MISC, USE AS DIRECTED (Patient taking differently: 1 each by Does not apply route 5 (five) times daily. Use to inject insulin 5 times per day), Disp: 100 each, Rfl: 3 .  blood glucose meter kit and supplies KIT, Dispense based on patient and insurance preference. ICD-10: E11.9, Disp: 1 each, Rfl: 0 .  glucose blood test strip, Use as instructed to check blood sugar before meals and at bedtime (Patient taking differently: 1 each by Other route 2 (two) times daily. Use as instructed to check blood sugar before meals and at bedtime), Disp: 200 each, Rfl: 12 .  Lancets (ONETOUCH ULTRASOFT) lancets, Use as instructed, Disp: 200 each, Rfl: 12 .  mupirocin ointment (BACTROBAN) 2 %, Place 1 application into the nose 2 (two) times daily., Disp: 22 g, Rfl: 0 .  VIAGRA 100 MG tablet, TAKE 1 TABLET 1 HOUR BEFORE INTERCOURSE, Disp: 4 tablet, Rfl: 11 .  ALPRAZolam (XANAX) 0.25 MG tablet, TAKE 1 TABLET BY MOUTH AT BEDTIME, Disp: 90 tablet, Rfl: 0 .  aspirin EC 81 MG tablet, Take 1 tablet (81 mg total) by mouth daily., Disp: 90 tablet, Rfl: 3 .  hydrochlorothiazide (HYDRODIURIL) 25 MG tablet, Take 1 tablet (25 mg total) by mouth daily., Disp: 30 tablet, Rfl: 3 .  insulin aspart (NOVOLOG  FLEXPEN) 100 UNIT/ML FlexPen, Inject 18 Units into the skin 3 (three) times daily with meals., Disp: 15 mL, Rfl: 6 .  Insulin Glargine (BASAGLAR KWIKPEN) 100 UNIT/ML SOPN, Inject 0.45 mLs (45 Units total) into the skin daily., Disp: 15 mL, Rfl: 6 .  Insulin Pen Needle 32G X 4 MM MISC, 1 pen by Does not apply route daily., Disp: 100 each, Rfl: 6 .  losartan (COZAAR) 100 MG tablet, TAKE 1 TABLET BY MOUTH EVERY DAY, Disp: 90 tablet, Rfl: 0 .  metoCLOPramide (REGLAN) 5 MG tablet, TAKE 1 TABLET BY MOUTH 3 TIMES A DAY BEFORE MEALS, Disp: 270 tablet, Rfl: 1 .  pantoprazole (PROTONIX) 40 MG tablet, TAKE 1 TABLET BY MOUTH EVERY DAY, Disp: 90 tablet, Rfl: 3 .  rosuvastatin (CRESTOR) 20 MG tablet, TAKE 1 TABLET BY MOUTH EVERY DAY, Disp: 90 tablet, Rfl: 3  Social History   Tobacco Use  Smoking Status Former Smoker  Smokeless Tobacco Never Used  Tobacco Comment   quit 10 years ago    Allergies  Allergen Reactions  . Sulfa Antibiotics     hallucinations   Objective:   Vitals:   10/14/18 1509  BP: (!) 181/81  Pulse: 84  Resp: 16  Temp: 97.6 F (36.4 C)   There is no height or weight on file to calculate BMI. Constitutional  Well developed. Well nourished.  Vascular Dorsalis pedis pulses palpable bilaterally. Posterior tibial pulses palpable bilaterally. Capillary refill normal to all digits.  No cyanosis or clubbing noted. Pedal hair growth normal.  Neurologic Normal speech. Oriented to person, place, and time. Epicritic sensation to light touch grossly absent bilaterally.  Dermatologic Nails well groomed and normal in appearance. Open ulceration noted at the left medial hallux 1x1 with granular base   Orthopedic: Normal joint ROM without pain or crepitus bilaterally. No visible deformities. No bony tenderness.   Radiographs: Taken and reviewed no acute fracture or discloations no osseous erosions Assessment:   1. Ulcer of great toe, left, limited to breakdown of skin ([place])   2.  Insulin dependent diabetes mellitus ([place])    Plan:  Patient was evaluated and treated and all questions answered.  L Hallux ulcer -Dressed with silvadene and DSD -XR as above -Dress daily with abx ointment and band-aid  Return in about 3 weeks (around 11/04/2018) for Wound Care, Right.

## 2019-01-19 ENCOUNTER — Ambulatory Visit: Payer: Medicare HMO | Admitting: Podiatry

## 2019-01-19 DIAGNOSIS — L97521 Non-pressure chronic ulcer of other part of left foot limited to breakdown of skin: Secondary | ICD-10-CM | POA: Diagnosis not present

## 2019-01-19 DIAGNOSIS — E08621 Diabetes mellitus due to underlying condition with foot ulcer: Secondary | ICD-10-CM

## 2019-01-19 NOTE — Progress Notes (Signed)
Subjective:  Patient ID: Joseph Dixon, male    DOB: 1951/12/17,  MRN: [id number]  Chief Complaint  Patient presents with  . Foot Problem    right foot diabetic ulcer f/u   68 y.o. male presents for wound care.  Thinks the sore is doing better currently using antibiotic ointment  Review of Systems: Negative except as noted in the HPI. Denies N/V/F/Ch.  Past Medical History:  Diagnosis Date  . Cancer (HCC)    basal cell both shoulders  . Diabetes mellitus    insulin dependant  . Hyperlipidemia     Current Outpatient Medications:  .  ALPRAZolam (XANAX) 0.25 MG tablet, TAKE 1 TABLET BY MOUTH AT BEDTIME, Disp: 90 tablet, Rfl: 0 .  aspirin EC 81 MG tablet, Take 1 tablet (81 mg total) by mouth daily., Disp: 90 tablet, Rfl: 3 .  B-D INS SYR ULTRAFINE 1CC/31G 31G X 5/16" 1 ML MISC, USE AS DIRECTED (Patient taking differently: 1 each by Does not apply route 5 (five) times daily. Use to inject insulin 5 times per day), Disp: 100 each, Rfl: 3 .  blood glucose meter kit and supplies KIT, Dispense based on patient and insurance preference. ICD-10: E11.9, Disp: 1 each, Rfl: 0 .  glucose blood test strip, Use as instructed to check blood sugar before meals and at bedtime (Patient taking differently: 1 each by Other route 2 (two) times daily. Use as instructed to check blood sugar before meals and at bedtime), Disp: 200 each, Rfl: 12 .  hydrochlorothiazide (HYDRODIURIL) 25 MG tablet, Take 1 tablet (25 mg total) by mouth daily., Disp: 30 tablet, Rfl: 3 .  insulin aspart (NOVOLOG FLEXPEN) 100 UNIT/ML FlexPen, Inject 18 Units into the skin 3 (three) times daily with meals., Disp: 15 mL, Rfl: 6 .  Insulin Glargine (BASAGLAR KWIKPEN) 100 UNIT/ML SOPN, Inject 0.45 mLs (45 Units total) into the skin daily., Disp: 15 mL, Rfl: 6 .  Insulin Pen Needle 32G X 4 MM MISC, 1 pen by Does not apply route daily., Disp: 100 each, Rfl: 6 .  Lancets (ONETOUCH ULTRASOFT) lancets, Use as instructed, Disp: 200 each, Rfl:  12 .  LEVEMIR FLEXTOUCH 100 UNIT/ML Pen, , Disp: , Rfl:  .  losartan (COZAAR) 100 MG tablet, TAKE 1 TABLET BY MOUTH EVERY DAY, Disp: 90 tablet, Rfl: 0 .  metoCLOPramide (REGLAN) 5 MG tablet, TAKE 1 TABLET BY MOUTH 3 TIMES A DAY BEFORE MEALS, Disp: 270 tablet, Rfl: 1 .  mupirocin ointment (BACTROBAN) 2 %, Place 1 application into the nose 2 (two) times daily., Disp: 22 g, Rfl: 0 .  pantoprazole (PROTONIX) 40 MG tablet, TAKE 1 TABLET BY MOUTH EVERY DAY, Disp: 90 tablet, Rfl: 3 .  rosuvastatin (CRESTOR) 20 MG tablet, TAKE 1 TABLET BY MOUTH EVERY DAY, Disp: 90 tablet, Rfl: 3 .  VIAGRA 100 MG tablet, TAKE 1 TABLET 1 HOUR BEFORE INTERCOURSE, Disp: 4 tablet, Rfl: 11  Social History   Tobacco Use  Smoking Status Former Smoker  Smokeless Tobacco Never Used  Tobacco Comment   quit 10 years ago    Allergies  Allergen Reactions  . Sulfa Antibiotics     hallucinations   Objective:  There were no vitals filed for this visit. There is no height or weight on file to calculate BMI. Constitutional Well developed. Well nourished.  Vascular Dorsalis pedis pulses palpable bilaterally. Posterior tibial pulses palpable bilaterally. Capillary refill normal to all digits.  No cyanosis or clubbing noted. Pedal hair growth normal.  Neurologic  Normal speech. Oriented to person, place, and time. Protective sensation absent  Dermatologic Wound Location: L hallux Wound Base: Granular/Healthy Peri-wound: Reddened, Calloused Exudate: None: wound tissue dry Wound Measurements:  0.6x0.6  Orthopedic: No pain to palpation either foot.   Radiographs: None today Assessment:   1. Diabetic ulcer of toe of left foot associated with diabetes mellitus due to underlying condition, limited to breakdown of skin [facility])    Plan:  Patient was evaluated and treated and all questions answered.  Ulcer L Great Toe -Debridement as below. -Dressed with silvadene, DSD. -Continue off-loading with surgical  shoe.  Procedure: Selective Debridement of Wound Rationale: Removal of devitalized tissue from the wound to promote healing.  Pre-Debridement Wound Measurements: 0.6 cm x 0.6 cm x 0.2 cm  Post-Debridement Wound Measurements: same as pre-debridement. Type of Debridement: sharp selective Tissue Removed: Devitalized soft-tissue Dressing: Dry, sterile, compression dressing. Disposition: Patient tolerated procedure well. Patient to return in 1 week for follow-up.     No follow-ups on file.

## 2019-01-26 ENCOUNTER — Ambulatory Visit (INDEPENDENT_AMBULATORY_CARE_PROVIDER_SITE_OTHER): Payer: Medicare HMO | Admitting: Internal Medicine

## 2019-01-26 ENCOUNTER — Encounter: Payer: Self-pay | Admitting: Internal Medicine

## 2019-01-26 VITALS — BP 140/64 | HR 63 | Ht 68.0 in | Wt 165.8 lb

## 2019-01-26 DIAGNOSIS — Z794 Long term (current) use of insulin: Secondary | ICD-10-CM

## 2019-01-26 DIAGNOSIS — E1165 Type 2 diabetes mellitus with hyperglycemia: Secondary | ICD-10-CM | POA: Diagnosis not present

## 2019-01-26 LAB — POCT GLYCOSYLATED HEMOGLOBIN (HGB A1C): Hemoglobin A1C: 8.9 % — AB (ref 4.0–5.6)

## 2019-01-26 MED ORDER — INSULIN ASPART 100 UNIT/ML FLEXPEN: PEN_INJECTOR | SUBCUTANEOUS | 11 refills | Status: DC

## 2019-01-26 NOTE — Progress Notes (Signed)
Name: Joseph Dixon  Age/ Sex: 68 y.o., male   MRN/ DOB: [id number], November 07, 1951     PCP: Elby Showers, MD   Reason for Endocrinology Evaluation: Type 2 Diabetes Mellitus  Initial Endocrine Consultative Visit: 11/29/2018    PATIENT IDENTIFIER: Joseph Dixon is a 68 y.o. male with a past medical history of DM, Oartic insufficiency and hyperlipidemia . The patient has followed with Endocrinology clinic since 11/29/2018 for consultative assistance with management of his diabetes.  DIABETIC HISTORY:  Joseph Dixon was diagnosed with T2DM ~ 2009, he does not recall any anti-glycemic agents he has been on, he has been on insulin for many years.  His hemoglobin A1c has ranged from 8.0% in 2019, peaking at 12.4% in 2016.   Joseph Dixon with chronic intermittent Hx of diarrhea. That at times he attributes to Hx  ETOH intake.   SUBJECTIVE:   During the last visit (12/20/2018): We switched Levemir to [place], we increased Novolog from 15 to 18 units.     Today (01/26/2019): Joseph Dixon is here for a 8 week follow up on his diabetes management.  He checks his blood sugars 3 times daily, preprandial. The patient has not had hypoglycemic episodes since the last clinic visit. Otherwise, the patient has not required any recent emergency interventions for hypoglycemia and has not had recent hospitalizations secondary to hyper or hypoglycemic episodes.   He has an ulcer at the left great toe that has been recently debrided by podiatry.  Patient does snack on [place] pineapple can at bedtime.    ROS: As per HPI and as detailed below: Review of Systems  Constitutional: Negative for fever and weight loss.  HENT: Negative for congestion and sore throat.   Respiratory: Negative for cough and shortness of breath.   Cardiovascular: Negative for chest pain and palpitations.      HOME DIABETES REGIMEN:  Levemir 40 units  daily Novolog 15 units TID QAC   GLUCOSE LOG :  Date Breakfast  Lunch  Supper Bedtime   01/02/19 232 190 163   1/14 217 167 216   1/15 336 208    1/16 269 241 223   1/17   289 105  1/18 316 131 245   1/19 228 97 264       HISTORY:  Past Medical History:  Past Medical History:  Diagnosis Date  . Cancer (HCC)    basal cell both shoulders  . Diabetes mellitus    insulin dependant  . Hyperlipidemia    Past Surgical History:  Past Surgical History:  Procedure Laterality Date  . repair of rt knee laceration      Social History:  reports that he has quit smoking. He has never used smokeless tobacco. He reports that he does not drink alcohol or use drugs. Family History:  Family History  Problem Relation Age of Onset  . Heart disease Father   . Diabetes Father   . Mental illness Father   . Alzheimer's disease Father   . COPD Brother   . Diabetes Mother   . Alzheimer's disease Mother   . Colon cancer Neg Hx      HOME MEDICATIONS: Allergies as of 01/26/2019      Reactions   Sulfa Antibiotics    hallucinations      Medication List       Accurate as of January 26, 2019  2:09 PM. Always use your most recent med list.  ALPRAZolam 0.25 MG tablet Commonly known as:  XANAX TAKE 1 TABLET BY MOUTH AT BEDTIME   aspirin EC 81 MG tablet Take 1 tablet (81 mg total) by mouth daily.   B-D INS SYR ULTRAFINE 1CC/31G 31G X 5/16" 1 ML Misc Generic drug:  Insulin Syringe-Needle U-100 USE AS DIRECTED   BASAGLAR KWIKPEN 100 UNIT/ML Sopn Inject 0.45 mLs (45 Units total) into the skin daily.   blood glucose meter kit and supplies Kit Dispense based on patient and insurance preference. ICD-10: E11.9   glucose blood test strip Use as instructed to check blood sugar before meals and at bedtime   hydrochlorothiazide 25 MG tablet Commonly known as:  HYDRODIURIL Take 1 tablet (25 mg total) by mouth daily.   insulin aspart 100 UNIT/ML FlexPen Commonly known as:   NOVOLOG FLEXPEN Inject 22 Units into the skin 3 (three) times daily with meals AND 4 Units with snacks.   Insulin Pen Needle 32G X 4 MM Misc 1 pen by Does not apply route daily.   LEVEMIR FLEXTOUCH 100 UNIT/ML Pen Generic drug:  Insulin Detemir   losartan 100 MG tablet Commonly known as:  COZAAR TAKE 1 TABLET BY MOUTH EVERY DAY   metoCLOPramide 5 MG tablet Commonly known as:  REGLAN TAKE 1 TABLET BY MOUTH 3 TIMES A DAY BEFORE MEALS   mupirocin ointment 2 % Commonly known as:  BACTROBAN Place 1 application into the nose 2 (two) times daily.   onetouch ultrasoft lancets Use as instructed   pantoprazole 40 MG tablet Commonly known as:  PROTONIX TAKE 1 TABLET BY MOUTH EVERY DAY   rosuvastatin 20 MG tablet Commonly known as:  CRESTOR TAKE 1 TABLET BY MOUTH EVERY DAY   VIAGRA 100 MG tablet Generic drug:  sildenafil TAKE 1 TABLET 1 HOUR BEFORE INTERCOURSE        OBJECTIVE:   Vital Signs: BP 140/64 (BP Location: Left Arm, Patient Position: Sitting, Cuff Size: Normal)   Pulse 63   Ht '5\' 8"'$  (1.727 m)   Wt 165 lb 12.8 oz (75.2 kg)   SpO2 95%   BMI 25.21 kg/m   Wt Readings from Last 3 Encounters:  01/26/19 165 lb 12.8 oz (75.2 kg)  12/23/18 160 lb (72.6 kg)  12/20/18 163 lb (73.9 kg)     Exam: General: Joseph Dixon appears well and is in NAD  Lungs: Clear with good BS bilat with no rales, rhonchi, or wheezes  Heart: RRR with normal S1 and S2 and no gallops; no murmurs; no rub  Abdomen: Normoactive bowel sounds, soft, nontender, without masses or organomegaly palpable  Extremities: No pretibial edema. No tremor.   Neuro: MS is good with appropriate affect, Joseph Dixon is alert and Ox3    DATA REVIEWED:  Lab Results  Component Value Date   HGBA1C 8.9 (A) 01/26/2019   HGBA1C 8.3 (H) 10/04/2018   HGBA1C 8.0 (H) 06/06/2018   Lab Results  Component Value Date   MICROALBUR 1.1 06/06/2018   LDLCALC 48 10/04/2018   CREATININE 0.79 12/30/2017   Lab Results  Component Value Date    MICRALBCREAT 7 06/06/2018     Lab Results  Component Value Date   CHOL 121 10/04/2018   HDL 43 10/04/2018   LDLCALC 48 10/04/2018   TRIG 240 (H) 10/04/2018   CHOLHDL 2.8 10/04/2018         ASSESSMENT / PLAN / RECOMMENDATIONS:   1) Type 2 Diabetes Mellitus, Poorly controlled, With neuropathic complications - Most recent A1c of 8.9 %.  Goal A1c < 7.0 %.   Plan:  - Hyperglycemia noted through the day and night , the highest glucose is noted in the morning when he first wakes up.This is related to his bedtime snacks, I have explained to the patient that the only way to prevent early morning hyperglycemia is to either stop bedtime snacks or we have to give him an NovoLog dose to take with the snack. - He agreed with the latter as he gets hungry at night and would like to continue with that.  Otherwise he avoid snacking during the day. - We have discussed in the past adding an insulin sensitizer such as Metformin. But patient would like to avoid this due to a long history of intermittent GI issues, so will avoid this.  -He was advised advised to bring his glucose log 2 weeks from now   MEDICATIONS:  Continue Basaglar 45 units daily   Increase Novolog to 22 units TID QAC  NovoLog 4 units with bedtime snack  EDUCATION / INSTRUCTIONS:  BG monitoring instructions: Patient is instructed to check his blood sugars 4 times a day, before meals and bedtime  Call [facility] if: BG persistently < 70 or > 300. . I reviewed the Rule of 15 for the treatment of hypoglycemia in detail with the patient. Literature supplied.    F/U in 3 months   Signed electronically by: Mack Guise, MD  [facility]  [facility] [address] [place], [place] [address] Phone: [phone] FAX: [phone]   CC: Elby Showers, MD [address] [address] Phone: [phone]  Fax: [phone]  Return to  Endocrinology clinic as below: Future Appointments  Date Time Provider [place]  02/16/2019  8:15 AM Evelina Bucy, DPM [facility]  04/06/2019  9:15 AM Renold Genta, Cresenciano Lick, MD [facility]  04/11/2019 10:00 AM Elby Showers, MD [facility]  04/26/2019  9:30 AM Shamleffer, Melanie Crazier, MD [facility] None

## 2019-01-26 NOTE — Patient Instructions (Addendum)
-   Continue Basaglar 45 units  - Novolog to 22 units with each meal  - With your BEDTIME snack take 4 units of Novolog  - Please drop off your sugar readings in 2 weeks  - Continue to check your sugars before each meal and bedtime

## 2019-02-10 ENCOUNTER — Telehealth: Payer: Self-pay | Admitting: Internal Medicine

## 2019-02-10 NOTE — Telephone Encounter (Signed)
Mr. Baltazar Najjar dropped off his glucose log    Date Breakfast  Lunch  Supper  2/20 288 212 172  2/19  228 236  2/18 283 164 163     Please ask him to do the following     - Continue Basaglar 45 units  - Continue Novolog 22 units  - Increase Novolog WITH bedtime snack to 8 units    Thank you   Abby Nena Jordan, MD  [facility]  [facility] [address] [place], [place] [address] Phone: [phone] FAX: [phone]

## 2019-02-13 NOTE — Telephone Encounter (Signed)
lft vm to return call 

## 2019-02-13 NOTE — Telephone Encounter (Signed)
Pt aware of instructions

## 2019-02-16 ENCOUNTER — Ambulatory Visit: Payer: Medicare HMO | Admitting: Podiatry

## 2019-02-27 ENCOUNTER — Telehealth: Payer: Self-pay | Admitting: Internal Medicine

## 2019-02-27 DIAGNOSIS — E1165 Type 2 diabetes mellitus with hyperglycemia: Secondary | ICD-10-CM

## 2019-02-27 DIAGNOSIS — Z794 Long term (current) use of insulin: Secondary | ICD-10-CM

## 2019-02-27 MED ORDER — BASAGLAR KWIKPEN 100 UNIT/ML ~~LOC~~ SOPN: 50.0000 [IU] | PEN_INJECTOR | Freq: Every day | SUBCUTANEOUS | 6 refills | Status: DC

## 2019-02-27 NOTE — Telephone Encounter (Signed)
lft vm for pt to return call to discuss changes

## 2019-02-27 NOTE — Telephone Encounter (Signed)
Charisse March , Mr. Westhoff Dropped of his glucose log    His sugars are all over the place, and I suggest he sees a dietician because he does not eat consistent amounts of carbohydrates with each meal. Which causes his sugars to fluctuate between 140- 300 mg/dL    Recommendations A referral to our CDE initiated  Increase Basaglar to 50 units  Please also ask him no need to drop off any more glucose data until the change has been made to his diet.    Thanks    Abby Nena Jordan, MD  [facility]  [facility] [address] [place], [place] [address] Phone: [phone] FAX: [phone]

## 2019-02-28 NOTE — Telephone Encounter (Signed)
lft 2nd vm requesting a call back to discuss changes

## 2019-02-28 NOTE — Telephone Encounter (Signed)
Spoke to pt and he stated an understanding of changes and agreed to make appt with Vaughan Basta or Mickel Baas

## 2019-03-19 ENCOUNTER — Other Ambulatory Visit: Payer: Self-pay | Admitting: Internal Medicine

## 2019-03-21 ENCOUNTER — Encounter: Payer: Self-pay | Admitting: Dietician

## 2019-03-21 ENCOUNTER — Encounter: Payer: Medicare HMO | Attending: Internal Medicine | Admitting: Dietician

## 2019-03-21 ENCOUNTER — Other Ambulatory Visit: Payer: Self-pay

## 2019-03-21 DIAGNOSIS — E119 Type 2 diabetes mellitus without complications: Secondary | ICD-10-CM | POA: Insufficient documentation

## 2019-03-21 DIAGNOSIS — Z794 Long term (current) use of insulin: Secondary | ICD-10-CM | POA: Insufficient documentation

## 2019-03-21 DIAGNOSIS — IMO0001 Reserved for inherently not codable concepts without codable children: Secondary | ICD-10-CM

## 2019-03-21 NOTE — Patient Instructions (Addendum)
Consider reducing your bacon to 2 strips rather than five Consider reducing your butter intake Continue to buy lean meat and chicken without the skin. Continue to bake rather than fry Consider drinking less milk (portion size is 8 ounces)     Continue to choose whole wheat bread and brown rice Consider adding more vegetables to your meals.  Recommend no skipping lunch.  Breakfast, Lunch, Dinner daily If you have a bedtime snack, remember to take Novolog before this. Continue to take your insulin as prescribed.  Stay active every day.  This will greatly effect your blood sugar.

## 2019-03-21 NOTE — Progress Notes (Signed)
Diabetes Self-Management Education  Visit Type: First/Initial  Appt. Start Time: 0930 Appt. End Time: 1100  03/24/2019  [name], identified by name and date of birth, is a 68 y.o. male with a diagnosis of Diabetes: Type 2.   ASSESSMENT Patient is here today alone.  History includes type 2 diabetes (2009, aortic insufficiency and HLD.  Intermittent diarrhea, etoh use (states that he is no longer using this).  He has an ulcer on his left great toe that is slowly healing.  Labs noted 01/26/19 A1C 8.9% increased from 8.3% 10/05/2019 Medications include:  Novolog 22 units before each meal and 8 units before each snack, Levemir 45 units daily  Patient lives with his wife who has MS.  He cares for her.  She falls easily but is otherwise healthy.  He does the shopping and cooking.  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  Height 5\' 9"  (1.753 m), weight 170 lb (77.1 kg). Body mass index is 25.1 kg/m.  Diabetes Self-Management Education - 03/21/19 1001      Visit Information   Visit Type  First/Initial      Initial Visit   Diabetes Type  Type 2    Are you currently following a meal plan?  No    Are you taking your medications as prescribed?  Yes    Date Diagnosed  2009      Health Coping   How would you rate your overall health?  Fair      Psychosocial Assessment   Patient Belief/Attitude about Diabetes  Other (comment)   "SUCKS"   Self-care barriers  None    Self-management support  Doctor's office;CDE visits    Other persons present  Patient;Spouse/SO    Patient Concerns  Nutrition/Meal planning;Glycemic Control;Healthy Lifestyle    Special Needs  None    Preferred Learning Style  No preference indicated    Learning Readiness  Ready    How often do you need to have someone help you  when you read instructions, pamphlets, or other written materials from your doctor or pharmacy?  1 - Never    What is the last grade level you completed in school?  12th grade      Pre-Education Assessment   Patient understands the diabetes disease and treatment process.  Needs Review    Patient understands incorporating nutritional management into lifestyle.  Needs Review    Patient undertands incorporating physical activity into lifestyle.  Needs Review    Patient understands using medications safely.  Needs Review    Patient understands monitoring blood glucose, interpreting and using results  Needs Review    Patient understands prevention, detection, and treatment of acute complications.  Needs Review    Patient understands prevention, detection, and treatment of chronic complications.  Needs Review    Patient understands how to develop strategies to address psychosocial issues.  Needs Review    Patient understands how to develop strategies to promote health/change behavior.  Needs Review      Complications   Last HgB A1C per patient/outside source  8.9 %   01/2019 increased from 8.3 09/2018   How often do you check your blood sugar?  3-4 times/day    Fasting Blood glucose range (mg/dL)  >200   high 200's  to low 300's   Postprandial Blood glucose range (mg/dL)  180-200;130-179    Number of hypoglycemic episodes per month  0   only got low in the past when he was doing increased physical labor   Number of hyperglycemic episodes per week  21    Can you tell when your blood sugar is high?  Yes    What do you do if your blood sugar is high?  nothing different- drinks a lot of water    Have you had a dilated eye exam in the past 12 months?  Yes    Have you had a dental exam in the past 12 months?  Yes    Are you checking your feet?  Yes    How many days per week are you checking your feet?  3      Dietary Intake   Breakfast  eggs, bacon (5 strips), 2 slices [employer] toast with butter (5 tsp)    9   Snack (morning)  none    Lunch  skips often if he is doing a lot    Snack (afternoon)  none    Dinner  TV dinner (salsbury steak, mashed potato, corn, apple dessert) OR hamburger with onions, few french fries OR spam, peas, creamed corn   6-9   Snack (evening)  PB crackers, whole milk, can of pineapple or other fruit    Beverage(s)  coffee with powdered creamer and splenda, flavored water, water, occasional diet soda, whole milk (3-6 cups per week)      Exercise   Exercise Type  Other (comment)   yard work     Patient Education   Previous Diabetes Education  Yes (please comment)   2009   Disease state   Definition of diabetes, type 1 and 2, and the diagnosis of diabetes    Nutrition management   Role of diet in the treatment of diabetes and the relationship between the three main macronutrients and blood glucose level;Meal timing in regards to the patients' current diabetes medication.;Meal options for control of blood glucose level and chronic complications.;Food label reading, portion sizes and measuring food.    Physical activity and exercise   Role of exercise on diabetes management, blood pressure control and cardiac health.    Medications  Reviewed patients medication for diabetes, action, purpose, timing of dose and side effects.    Monitoring  Purpose and frequency of SMBG.;Identified appropriate SMBG and/or A1C goals.;Daily foot exams;Yearly dilated eye exam    Acute complications  Taught treatment of hypoglycemia - the 15 rule.    Chronic complications  Relationship between chronic complications and blood glucose control;Dental care    Psychosocial adjustment  Role of stress on diabetes;Worked with patient to identify barriers to care and solutions    Personal strategies to promote health  Lifestyle issues that need to be addressed for better diabetes care      Individualized Goals (developed by patient)   Nutrition  General guidelines for healthy choices and portions  discussed    Physical Activity  Exercise 5-7 days per week;30 minutes per day    Medications  take my medication as prescribed    Monitoring   test my blood glucose as discussed    Reducing Risk  examine blood glucose patterns    Health Coping  discuss diabetes with (comment)      Post-Education Assessment   Patient understands the diabetes disease and treatment process.  Demonstrates understanding / competency    Patient  understands incorporating nutritional management into lifestyle.  Demonstrates understanding / competency    Patient undertands incorporating physical activity into lifestyle.  Demonstrates understanding / competency    Patient understands using medications safely.  Demonstrates understanding / competency    Patient understands monitoring blood glucose, interpreting and using results  Demonstrates understanding / competency    Patient understands prevention, detection, and treatment of acute complications.  Demonstrates understanding / competency    Patient understands prevention, detection, and treatment of chronic complications.  Demonstrates understanding / competency    Patient understands how to develop strategies to address psychosocial issues.  Demonstrates understanding / competency    Patient understands how to develop strategies to promote health/change behavior.  Demonstrates understanding / competency      Outcomes   Expected Outcomes  Other (comment)   demonstrates interest in learning but question change   Future DMSE  PRN    Program Status  Completed       Individualized Plan for Diabetes Self-Management Training:   Learning Objective:  Patient will have a greater understanding of diabetes self-management. Patient education plan is to attend individual and/or group sessions per assessed needs and concerns.   Plan:   Patient Instructions  Consider reducing your bacon to 2 strips rather than five Consider reducing your butter intake Continue to buy  lean meat and chicken without the skin. Continue to bake rather than fry Consider drinking less milk (portion size is 8 ounces)     Continue to choose whole wheat bread and brown rice Consider adding more vegetables to your meals.  Recommend no skipping lunch.  Breakfast, Lunch, Dinner daily If you have a bedtime snack, remember to take Novolog before this. Continue to take your insulin as prescribed.  Stay active every day.  This will greatly effect your blood sugar.   Expected Outcomes:  Other (comment)(demonstrates interest in learning but question change)  Education material provided: ADA - How to Thrive: A Guide for Your Journey with Diabetes, Food label handouts, A1C conversion sheet, Meal plan card and Snack sheet  If problems or questions, patient to contact team via:  Phone  Future DSME appointment: PRN

## 2019-03-27 ENCOUNTER — Other Ambulatory Visit: Payer: Self-pay | Admitting: Internal Medicine

## 2019-03-27 MED ORDER — ALPRAZOLAM 0.25 MG PO TABS: 0.2500 mg | ORAL_TABLET | Freq: Every day | ORAL | 0 refills | Status: DC

## 2019-03-27 NOTE — Telephone Encounter (Signed)
Faxed Medication request   [facility]  Last refill 12/03/18  Last OV 1.3.20  ALPRAZolam (XANAX) 0.25 MG tablet

## 2019-03-31 ENCOUNTER — Ambulatory Visit: Payer: Medicare HMO | Admitting: Dietician

## 2019-04-05 ENCOUNTER — Telehealth: Payer: Self-pay | Admitting: Internal Medicine

## 2019-04-05 NOTE — Telephone Encounter (Signed)
Phone call from pharmacy.  Pharmacist says patient has been prescribed tramadol with acetaminophen by Dr. Golden Circle number 16 tablets on April 2.  He is now trying to refill his Xanax and they are concerned about a possible medication interaction.  I spoke personally with the patient by phone and was at the pharmacy.  He says he only took 1 of these tablets.  It made him nauseated and he did not take anymore.  He said it was for a tooth extraction.  I then gave authorization for pharmacist to refill his Xanax.

## 2019-04-06 ENCOUNTER — Other Ambulatory Visit: Payer: Medicare HMO | Admitting: Internal Medicine

## 2019-04-06 ENCOUNTER — Other Ambulatory Visit: Payer: Self-pay

## 2019-04-06 VITALS — Temp 98.3°F

## 2019-04-06 DIAGNOSIS — E1165 Type 2 diabetes mellitus with hyperglycemia: Secondary | ICD-10-CM | POA: Diagnosis not present

## 2019-04-06 DIAGNOSIS — E119 Type 2 diabetes mellitus without complications: Secondary | ICD-10-CM

## 2019-04-06 DIAGNOSIS — E781 Pure hyperglyceridemia: Secondary | ICD-10-CM | POA: Diagnosis not present

## 2019-04-06 DIAGNOSIS — I5189 Other ill-defined heart diseases: Secondary | ICD-10-CM | POA: Diagnosis not present

## 2019-04-06 DIAGNOSIS — E1169 Type 2 diabetes mellitus with other specified complication: Secondary | ICD-10-CM | POA: Diagnosis not present

## 2019-04-06 DIAGNOSIS — E785 Hyperlipidemia, unspecified: Secondary | ICD-10-CM | POA: Diagnosis not present

## 2019-04-06 DIAGNOSIS — I351 Nonrheumatic aortic (valve) insufficiency: Secondary | ICD-10-CM | POA: Diagnosis not present

## 2019-04-06 DIAGNOSIS — IMO0001 Reserved for inherently not codable concepts without codable children: Secondary | ICD-10-CM

## 2019-04-06 DIAGNOSIS — Z794 Long term (current) use of insulin: Secondary | ICD-10-CM

## 2019-04-06 DIAGNOSIS — I10 Essential (primary) hypertension: Secondary | ICD-10-CM

## 2019-04-06 DIAGNOSIS — N529 Male erectile dysfunction, unspecified: Secondary | ICD-10-CM | POA: Diagnosis not present

## 2019-04-06 DIAGNOSIS — E1143 Type 2 diabetes mellitus with diabetic autonomic (poly)neuropathy: Secondary | ICD-10-CM | POA: Diagnosis not present

## 2019-04-07 LAB — MICROALBUMIN / CREATININE URINE RATIO
Creatinine, Urine: 71 mg/dL (ref 20–320)
Microalb Creat Ratio: 24 mcg/mg creat (ref <30)
Microalb, Ur: 1.7 mg/dL

## 2019-04-07 LAB — COMPLETE METABOLIC PANEL WITH GFR
AG Ratio: 1.7 (calc) (ref 1.0–2.5)
ALT: 19 U/L (ref 9–46)
AST: 15 U/L (ref 10–35)
Albumin: 4.3 g/dL (ref 3.6–5.1)
Alkaline phosphatase (APISO): 63 U/L (ref 35–144)
BUN: 25 mg/dL (ref 7–25)
CO2: 28 mmol/L (ref 20–32)
Calcium: 10 mg/dL (ref 8.6–10.3)
Chloride: 101 mmol/L (ref 98–110)
Creat: 0.88 mg/dL (ref 0.70–1.25)
GFR, Est African American: 104 mL/min/{1.73_m2} (ref >=60)
GFR, Est Non African American: 90 mL/min/{1.73_m2} (ref >=60)
Globulin: 2.6 g/dL (calc) (ref 1.9–3.7)
Glucose, Bld: 236 mg/dL — ABNORMAL HIGH (ref 65–99)
Potassium: 4.6 mmol/L (ref 3.5–5.3)
Sodium: 139 mmol/L (ref 135–146)
Total Bilirubin: 0.4 mg/dL (ref 0.2–1.2)
Total Protein: 6.9 g/dL (ref 6.1–8.1)

## 2019-04-07 LAB — CBC WITH DIFFERENTIAL/PLATELET
Absolute Monocytes: 614 cells/uL (ref 200–950)
Basophils Absolute: 80 cells/uL (ref 0–200)
Basophils Relative: 0.9 %
Eosinophils Absolute: 320 cells/uL (ref 15–500)
Eosinophils Relative: 3.6 %
HCT: 44.2 % (ref 38.5–50.0)
Hemoglobin: 15 g/dL (ref 13.2–17.1)
Lymphs Abs: 2189 cells/uL (ref 850–3900)
MCH: 30.2 pg (ref 27.0–33.0)
MCHC: 33.9 g/dL (ref 32.0–36.0)
MCV: 88.9 fL (ref 80.0–100.0)
MPV: 12.1 fL (ref 7.5–12.5)
Monocytes Relative: 6.9 %
Neutro Abs: 5696 cells/uL (ref 1500–7800)
Neutrophils Relative %: 64 %
Platelets: 240 10*3/uL (ref 140–400)
RBC: 4.97 10*6/uL (ref 4.20–5.80)
RDW: 12.8 % (ref 11.0–15.0)
Total Lymphocyte: 24.6 %
WBC: 8.9 10*3/uL (ref 3.8–10.8)

## 2019-04-07 LAB — LIPID PANEL
Cholesterol: 148 mg/dL (ref <200)
HDL: 47 mg/dL (ref >=40)
LDL Cholesterol (Calc): 72 mg/dL (calc)
Non-HDL Cholesterol (Calc): 101 mg/dL (calc) (ref <130)
Total CHOL/HDL Ratio: 3.1 (calc) (ref <5.0)
Triglycerides: 196 mg/dL — ABNORMAL HIGH (ref <150)

## 2019-04-07 LAB — HEMOGLOBIN A1C
Hgb A1c MFr Bld: 9.5 % of total Hgb — ABNORMAL HIGH (ref <5.7)
Mean Plasma Glucose: 226 (calc)
eAG (mmol/L): 12.5 (calc)

## 2019-04-07 LAB — PSA: PSA: 0.4 ng/mL (ref <=4.0)

## 2019-04-11 ENCOUNTER — Encounter: Payer: Self-pay | Admitting: Internal Medicine

## 2019-04-11 ENCOUNTER — Ambulatory Visit (INDEPENDENT_AMBULATORY_CARE_PROVIDER_SITE_OTHER): Payer: Medicare HMO | Admitting: Internal Medicine

## 2019-04-11 DIAGNOSIS — E785 Hyperlipidemia, unspecified: Secondary | ICD-10-CM

## 2019-04-11 DIAGNOSIS — E119 Type 2 diabetes mellitus without complications: Secondary | ICD-10-CM | POA: Diagnosis not present

## 2019-04-11 DIAGNOSIS — Z0001 Encounter for general adult medical examination with abnormal findings: Secondary | ICD-10-CM

## 2019-04-11 DIAGNOSIS — E1165 Type 2 diabetes mellitus with hyperglycemia: Secondary | ICD-10-CM | POA: Diagnosis not present

## 2019-04-11 DIAGNOSIS — Z9119 Patient's noncompliance with other medical treatment and regimen: Secondary | ICD-10-CM | POA: Diagnosis not present

## 2019-04-11 DIAGNOSIS — N529 Male erectile dysfunction, unspecified: Secondary | ICD-10-CM | POA: Diagnosis not present

## 2019-04-11 DIAGNOSIS — K3184 Gastroparesis: Secondary | ICD-10-CM

## 2019-04-11 DIAGNOSIS — E1143 Type 2 diabetes mellitus with diabetic autonomic (poly)neuropathy: Secondary | ICD-10-CM | POA: Diagnosis not present

## 2019-04-11 DIAGNOSIS — E1169 Type 2 diabetes mellitus with other specified complication: Secondary | ICD-10-CM | POA: Diagnosis not present

## 2019-04-11 DIAGNOSIS — IMO0001 Reserved for inherently not codable concepts without codable children: Secondary | ICD-10-CM

## 2019-04-11 DIAGNOSIS — Z794 Long term (current) use of insulin: Secondary | ICD-10-CM | POA: Diagnosis not present

## 2019-04-11 DIAGNOSIS — I10 Essential (primary) hypertension: Secondary | ICD-10-CM

## 2019-04-11 DIAGNOSIS — Z0000 Encounter for general adult medical examination without abnormal findings: Secondary | ICD-10-CM

## 2019-04-11 DIAGNOSIS — I351 Nonrheumatic aortic (valve) insufficiency: Secondary | ICD-10-CM | POA: Diagnosis not present

## 2019-04-11 MED ORDER — LOSARTAN POTASSIUM 100 MG PO TABS: 100.0000 mg | ORAL_TABLET | Freq: Every day | ORAL | 1 refills | Status: DC

## 2019-04-11 NOTE — Progress Notes (Signed)
Subjective:    Patient ID: Joseph Dixon, male    DOB: 08-Oct-1951, 68 y.o.   MRN: [id number]  HPI  68 year old seen via virtual visit due to coronavirus pandemic for Medicare annual wellness visit and follow-up on type 2 diabetes mellitus as well as hypertension.  He has not been checking his blood pressure at home.  Patient identified as Joseph Dixon, a longstanding patient in this practice  using 2 identifiers.  He verbally consents to this visit today.  Interactive audio and video telecommunications were attempted between this provider and patient however failed as patient did not have access to video capability.  We continued and completed visit with audio only.  Patient saw nutritionist in March regarding diabetic diet.  Says he has been trying to follow a diabetic diet but sometimes does not.  Dr. Kelton Pillar, Endocrinologist has follow-up visit scheduled for him in May.  He said he was not even aware of that until I pointed it out today.  Patient has longstanding history of passive-aggressive behavior toward his medical problems.  He does not really accept responsibility for dietary control of diabetes and hyperlipidemia as much as he should.  He does not check his blood pressure at home like he should either.  This is frustrating for his medical providers.  His hemoglobin A1c was 9.5% April 16.  In February his A1c was 8.9%.  In October 2019 it was 8.3% and in June 2019 was 8%.  We could consider sending [facility] nurse to his home to address his poor diabetic management.  His triglycerides are elevated at 196 on statin therapy.  His total cholesterol and LDL cholesterol are normal.  His PSA is normal.  Interestingly he has normal microalbumin in urine.  He has a history of aortic insufficiency but currently is stable.   We will plan to do his regular annual physical examination here in the office in June.  He has an appointment.      Review of Systems no new complaints      Objective:   Physical Exam Seen by virtual visit and not examined  Subjective:   Patient presents for Medicare Annual/Subsequent preventive examination.  Review Past Medical/Family/Social: Unchanged from previous evaluations.  He is retired.  He is married.  Does not smoke.  Drinks beer.  Frequently skips meals.   Risk Factors  Current exercise habits: Activities outside the home including yard work Dietary issues discussed: Low-fat low carbohydrate.  Has seen dietitian.  Cardiac risk factors: Poorly controlled diabetes mellitus, hypertension, hyperlipidemia  Depression Screen  (Note: if answer to either of the following is "Yes", a more complete depression screening is indicated)   Over the past two weeks, have you felt down, depressed or hopeless? No  Over the past two weeks, have you felt little interest or pleasure in doing things? No Have you lost interest or pleasure in daily life? No Do you often feel hopeless? No Do you cry easily over simple problems? No   Activities of Daily Living  In your present state of health, do you have any difficulty performing the following activities?:   Driving? No  Managing money? No  Feeding yourself? No  Getting from bed to chair? No  Climbing a flight of stairs? No  Preparing food and eating?: No  Bathing or showering? No  Getting dressed: No  Getting to the toilet? No  Using the toilet:No  Moving around from place to place: No  In the past  year have you fallen or had a near fall?:No  Are you sexually active? No have  erectile dysfunction Do you have more than one partner? No   Hearing Difficulties: No  Do you often ask people to speak up or repeat themselves?  Sometimes  Do you experience ringing or noises in your ears? No  Do you have difficulty understanding soft or whispered voices?  Sometimes Do you feel that you have a problem with memory? No Do you often misplace items?  Sometimes   Home Safety:  Do you have a smoke  alarm at your residence? Yes Do you have grab bars in the bathroom?  Yes Do you have throw rugs in your house?  Yes   Cognitive Testing  Alert? Yes Normal Appearance?Yes  Oriented to person? Yes Place? Yes  Time? Yes  Recall of three objects?  Not tested Can perform simple calculations? Yes  Displays appropriate judgment?Yes  Can read the correct time from a watch face?Yes   List the Names of Other Physician/Practitioners you currently use:  See referral list for the physicians patient is currently seeing.  Endocrinologist   Review of Systems: See above   Objective:      Psych: Alert & Oriented x 3, Mood appear stable via audio communications   Assessment:    Annual wellness medicare exam   Plan:    During the course of the visit the patient was educated and counseled about appropriate screening and preventive services including:   Annual diabetic eye exam  Annual flu vaccine     Patient Instructions (the written plan) was given to the patient.  Medicare Attestation  I have personally reviewed:  The patient's medical and social history  Their use of alcohol, tobacco or illicit drugs  Their current medications and supplements  The patient's functional ability including ADLs,fall risks, home safety risks, cognitive, and hearing and visual impairment  Diet and physical activities  Evidence for depression or mood disorders  The patient's weight, height, BMI, and visual acuity have been recorded in the chart. I have made referrals, counseling, and provided education to the patient based on review of the above and I have provided the patient with a written personalized care plan for preventive services.            Assessment & Plan:  Poorly controlled diabetes mellitus  Hyperlipidemia on statin therapy  Essential hypertension has not checked his blood pressure as requested  Passive-aggressive personality  Annual Medicare wellness visit   Plan: We  will consider sending [facility] nurse out to visit him regarding poor diabetic control.  He is to follow-up with Endocrinologist in May.  Encouraged to check his blood pressure on a regular basis and to watch his diet.

## 2019-04-16 NOTE — Patient Instructions (Signed)
Will consider sending TSH and nurse to see him.  He has seen dietitian and is followed by endocrinologist.  Needs to take more responsibility for diabetic control.  Reminded regarding diabetic eye exam yearly.  Continue same medications.  Physical exam scheduled for June here in this office.  Follow-up with Dr. Kelton Pillar, endocrinologist  in May

## 2019-04-26 ENCOUNTER — Ambulatory Visit: Payer: Medicare HMO | Admitting: Internal Medicine

## 2019-04-26 ENCOUNTER — Other Ambulatory Visit: Payer: Self-pay

## 2019-04-26 ENCOUNTER — Encounter: Payer: Self-pay | Admitting: Internal Medicine

## 2019-04-26 ENCOUNTER — Encounter: Payer: Medicare HMO | Admitting: Internal Medicine

## 2019-04-26 NOTE — Progress Notes (Signed)
Virtual Visit via Video Note  I connected with [name] 04/26/19 at 10:30 AM  by a video enabled telemedicine application and verified that I am speaking with the correct person using two identifiers.   I discussed the limitations of evaluation and management by telemedicine and the availability of in person appointments. The patient expressed understanding and agreed to proceed.   -Location of the patient : Home -Location of the provider : Office -The names of all persons participating in the telemedicine service : Pt and myself            Name: Joseph Dixon  Age/ Sex: 68 y.o., male   MRN/ DOB: [id number], October 16, 1951     PCP: Elby Showers, MD   Reason for Endocrinology Evaluation: Type 2 Diabetes Mellitus  Initial Endocrine Consultative Visit: 11/29/2018    PATIENT IDENTIFIER: [name] is a 68 y.o. male with a past medical history of DM, Oartic insufficiency and hyperlipidemia . The patient has followed with Endocrinology clinic since 11/29/2018 for consultative assistance with management of his diabetes.  DIABETIC HISTORY:  [name] was diagnosed with T2DM ~ 2009, he does not recall any anti-glycemic agents he has been on, he has been on insulin for many years.  His hemoglobin A1c has ranged from 8.0% in 2019, peaking at 12.4% in 2016.   Pt with chronic intermittent Hx of diarrhea. That at times he attributes to Hx  ETOH intake.   SUBJECTIVE:   During the last visit (01/26/2019): We continued basaglar 45 units and increased Novolog to 22 units , we also added a 8 units of Novolog with bedtime snack  Today (04/26/2019): Joseph Dixon is here for a 8 week follow up on his diabetes management.  He checks his blood sugars 3 times daily, preprandial. The patient has not had hypoglycemic episodes since the last clinic visit. Otherwise, the patient has not required any recent emergency interventions for hypoglycemia and has not had recent hospitalizations secondary to  hyper or hypoglycemic episodes.   He has an ulcer at the left great toe that has been recently debrided by podiatry.  Patient does snack on [place] pineapple can at bedtime.    ROS: As per HPI and as detailed below: Review of Systems  Constitutional: Negative for fever and weight loss.  HENT: Negative for congestion and sore throat.   Respiratory: Negative for cough and shortness of breath.   Cardiovascular: Negative for chest pain and palpitations.      HOME DIABETES REGIMEN:  Basaglar 45 units daily Novolog 22 units TID QAC Novolog 8 units with bedtime snack    GLUCOSE LOG :  Date Breakfast  Lunch  Supper Bedtime   04/23/2019 242  240   5/4 239  223   5/5 260 224    5/6 301      DIABETIC COMPLICATIONS: Microvascular complications:   Neuropathy   Denies: retinopathy, nephropathy   Last eye exam: Completed 02/2018  Macrovascular complications:   Denies: CAD, PVD, CVA   HISTORY:  Past Medical History:  Past Medical History:  Diagnosis Date  . Cancer (HCC)    basal cell both shoulders  . Diabetes mellitus    insulin dependant  . Hyperlipidemia    Past Surgical History:  Past Surgical History:  Procedure Laterality Date  . repair of rt knee laceration      Social History:  reports that he has quit smoking. He has never used smokeless tobacco. He reports that  he does not drink alcohol or use drugs. Family History:  Family History  Problem Relation Age of Onset  . Heart disease Father   . Diabetes Father   . Mental illness Father   . Alzheimer's disease Father   . COPD Brother   . Diabetes Mother   . Alzheimer's disease Mother   . Colon cancer Neg Hx      HOME MEDICATIONS: Allergies as of 04/26/2019      Reactions   Sulfa Antibiotics    hallucinations      Medication List       Accurate as of Apr 26, 2019 10:19 AM. Always use your most recent med list.        ALPRAZolam 0.25 MG tablet Commonly known as:  XANAX Take 1 tablet (0.25 mg  total) by mouth at bedtime.   aspirin EC 81 MG tablet Take 1 tablet (81 mg total) by mouth daily.   B-D INS SYR ULTRAFINE 1CC/31G 31G X 5/16" 1 ML Misc Generic drug:  Insulin Syringe-Needle U-100 USE AS DIRECTED   Basaglar KwikPen 100 UNIT/ML Sopn Inject 0.5 mLs (50 Units total) into the skin daily.   blood glucose meter kit and supplies Kit Dispense based on patient and insurance preference. ICD-10: E11.9   glucose blood test strip Use as instructed to check blood sugar before meals and at bedtime   hydrochlorothiazide 25 MG tablet Commonly known as:  HYDRODIURIL TAKE 1 TABLET BY MOUTH EVERY DAY   insulin aspart 100 UNIT/ML FlexPen Commonly known as:  NovoLOG FlexPen Inject 22 Units into the skin 3 (three) times daily with meals AND 4 Units with snacks.   Insulin Pen Needle 32G X 4 MM Misc 1 pen by Does not apply route daily.   Levemir FlexTouch 100 UNIT/ML Pen Generic drug:  Insulin Detemir 45 Units.   losartan 100 MG tablet Commonly known as:  COZAAR Take 1 tablet (100 mg total) by mouth daily.   metoCLOPramide 5 MG tablet Commonly known as:  REGLAN TAKE 1 TABLET BY MOUTH 3 TIMES A DAY BEFORE MEALS   onetouch ultrasoft lancets Use as instructed   pantoprazole 40 MG tablet Commonly known as:  PROTONIX TAKE 1 TABLET BY MOUTH EVERY DAY   rosuvastatin 20 MG tablet Commonly known as:  CRESTOR TAKE 1 TABLET BY MOUTH EVERY DAY   Viagra 100 MG tablet Generic drug:  sildenafil TAKE 1 TABLET 1 HOUR BEFORE INTERCOURSE        DATA REVIEWED:  Lab Results  Component Value Date   HGBA1C 9.5 (H) 04/06/2019   HGBA1C 8.9 (A) 01/26/2019   HGBA1C 8.3 (H) 10/04/2018   Lab Results  Component Value Date   MICROALBUR 1.7 04/06/2019   LDLCALC 72 04/06/2019   CREATININE 0.88 04/06/2019   Lab Results  Component Value Date   MICRALBCREAT 24 04/06/2019     Lab Results  Component Value Date   CHOL 148 04/06/2019   HDL 47 04/06/2019   LDLCALC 72 04/06/2019    TRIG 196 (H) 04/06/2019   CHOLHDL 3.1 04/06/2019         ASSESSMENT / PLAN / RECOMMENDATIONS:   1) Type 2 Diabetes Mellitus, Poorly controlled, With neuropathic complications - Most recent A1c of 8.9 %. Goal A1c < 7.0 %.   Plan:  - Hyperglycemia noted through the day and night , the highest glucose is noted in the morning when he first wakes up.This is related to his bedtime snacks, I have explained to the patient  that the only way to prevent early morning hyperglycemia is to either stop bedtime snacks or we have to give him an NovoLog dose to take with the snack. - He agreed with the latter as he gets hungry at night and would like to continue with that.  Otherwise he avoid snacking during the day. - We have discussed in the past adding an insulin sensitizer such as Metformin. But patient would like to avoid this due to a long history of intermittent GI issues, so will avoid this.  -He was advised advised to bring his glucose log 2 weeks from now   MEDICATIONS:  Continue Basaglar 45 units daily   Increase Novolog to 22 units TID QAC  NovoLog 4 units with bedtime snack  EDUCATION / INSTRUCTIONS:  BG monitoring instructions: Patient is instructed to check his blood sugars 4 times a day, before meals and bedtime  Call [facility] if: BG persistently < 70 or > 300. . I reviewed the Rule of 15 for the treatment of hypoglycemia in detail with the patient. Literature supplied.    2) Diabetic complications:   Eye: Does not have known diabetic retinopathy. Last eye exam was   Neuro/ Feet: Does have known diabetic peripheral neuropathy.  Renal: Patient does  have known baseline CKD. He is  on an ACEI/ARB at present.   3) Lipids: Patient is on a statin.    4) Hypertension: He is at goal of < 140/90 mmHg.      I discussed the assessment and treatment plan with the patient. The patient was provided an opportunity to ask questions and all were answered. The  patient agreed with the plan and demonstrated an understanding of the instructions.   The patient was advised to call back or seek an in-person evaluation if the symptoms worsen or if the condition fails to improve as anticipated.    F/U in 3 months   Signed electronically by: Mack Guise, MD  [facility] Endocrinology  [facility] [address] [place],  [address] Phone: [phone] FAX: [phone]   CC: Elby Showers, MD [address] [address] Phone: [phone]  Fax: [phone]  Return to Endocrinology clinic as below: Future Appointments  Date Time Provider [place]  04/26/2019 10:30 AM Shamleffer, Melanie Crazier, MD [facility] None  06/06/2019  2:00 PM Baxley, Cresenciano Lick, MD [facility]

## 2019-04-28 ENCOUNTER — Ambulatory Visit (INDEPENDENT_AMBULATORY_CARE_PROVIDER_SITE_OTHER): Payer: Medicare HMO | Admitting: Internal Medicine

## 2019-04-28 ENCOUNTER — Encounter: Payer: Self-pay | Admitting: Internal Medicine

## 2019-04-28 DIAGNOSIS — Z794 Long term (current) use of insulin: Secondary | ICD-10-CM | POA: Diagnosis not present

## 2019-04-28 DIAGNOSIS — E1165 Type 2 diabetes mellitus with hyperglycemia: Secondary | ICD-10-CM

## 2019-04-28 DIAGNOSIS — E1142 Type 2 diabetes mellitus with diabetic polyneuropathy: Secondary | ICD-10-CM | POA: Diagnosis not present

## 2019-04-28 MED ORDER — INSULIN ASPART 100 UNIT/ML FLEXPEN: 22.0000 [IU] | PEN_INJECTOR | Freq: Three times a day (TID) | SUBCUTANEOUS | 11 refills | Status: DC

## 2019-04-28 NOTE — Progress Notes (Signed)
Virtual Visit via Telephone Note  I connected with Joseph Dixon 04/28/19 at 10:30 AM  by telephone and verified that I am speaking with the correct person using two identifiers.   I discussed the limitations, risks, security and privacy concerns of performing an evaluation and management service by telephone and the availability of in person appointments. I also discussed with the patient that there may be a patient responsible charge related to this service. The patient expressed understanding and agreed to proceed.  -Location of the patient : Home  -Location of the provider : Office -The names of all persons participating in the telemedicine service : Pt and myself          Name: Joseph Dixon  Age/ Sex: 68 y.o., male   MRN/ DOB: [id number], 03/10/1951     PCP: Elby Showers, MD   Reason for Endocrinology Evaluation: Type 2 Diabetes Mellitus  Initial Endocrine Consultative Visit: 11/29/2018    PATIENT IDENTIFIER: Joseph Dixon is a 68 y.o. male with a past medical history of DM, Oartic insufficiency and hyperlipidemia . The patient has followed with Endocrinology clinic since 11/29/2018 for consultative assistance with management of his diabetes.  DIABETIC HISTORY:  Joseph Dixon was diagnosed with T2DM ~ 2009, he does not recall any anti-glycemic agents he has been on, he has been on insulin for many years.  His hemoglobin A1c has ranged from 8.0% in 2019, peaking at 12.4% in 2016.   Pt with chronic intermittent Hx of diarrhea. That at times he attributes to Hx  ETOH intake.   SUBJECTIVE:   During the last visit (01/26/2019): A1c 8.9%. We continued basaglar 45 units daily and increased Novolog to 22 units TIDQAC , he was also given 4 units of Novolog with a bedtime snack.    Today (04/28/2019): Joseph Dixon is here for a 8 week follow up on his diabetes management.  He checks his blood sugars 3-4 times daily, preprandial and bedtime. The patient has not had hypoglycemic  episodes since the last clinic visit. He has stopped eating the bedtime snack. he has been using basaglar and levemir interchangeably (not at the same time), because he doesn't want to waste the levemir pens he already has.Otherwise, the patient has not required any recent emergency interventions for hypoglycemia and has not had recent hospitalizations secondary to hyper or hypoglycemic episodes.    ROS: As per HPI and as detailed below: Review of Systems  Constitutional: Negative for fever and weight loss.  HENT: Negative for congestion and sore throat.   Respiratory: Negative for cough and shortness of breath.   Cardiovascular: Negative for chest pain and palpitations.      HOME DIABETES REGIMEN:  Basaglar 45 units daily- alternates with Levemir  Novolog 22 units TID QAC Novolog 4 units qith bedtime snack.   GLUCOSE LOG :   Yesterday's reading 196       172    187   201 This am  264     DIABETIC COMPLICATIONS: Microvascular complications:   Neuropathy   Denies: retinopathy, nephropathy   Last eye exam: Completed 02/2018  Macrovascular complications:   Denies: CAD, PVD, CVA    HISTORY:  Past Medical History:  Past Medical History:  Diagnosis Date  . Cancer (HCC)    basal cell both shoulders  . Diabetes mellitus    insulin dependant  . Hyperlipidemia    Past Surgical History:  Past Surgical History:  Procedure Laterality Date  . repair  of rt knee laceration      Social History:  reports that he has quit smoking. He has never used smokeless tobacco. He reports that he does not drink alcohol or use drugs. Family History:  Family History  Problem Relation Age of Onset  . Heart disease Father   . Diabetes Father   . Mental illness Father   . Alzheimer's disease Father   . COPD Brother   . Diabetes Mother   . Alzheimer's disease Mother   . Colon cancer Neg Hx      HOME MEDICATIONS: Allergies as of 04/28/2019      Reactions   Sulfa Antibiotics     hallucinations      Medication List       Accurate as of Apr 28, 2019  8:35 AM. If you have any questions, ask your nurse or doctor.        ALPRAZolam 0.25 MG tablet Commonly known as:  XANAX Take 1 tablet (0.25 mg total) by mouth at bedtime.   aspirin EC 81 MG tablet Take 1 tablet (81 mg total) by mouth daily.   B-D INS SYR ULTRAFINE 1CC/31G 31G X 5/16" 1 ML Misc Generic drug:  Insulin Syringe-Needle U-100 USE AS DIRECTED What changed:    how much to take  how to take this  when to take this  additional instructions   Basaglar KwikPen 100 UNIT/ML Sopn Inject 0.5 mLs (50 Units total) into the skin daily. What changed:  how much to take   blood glucose meter kit and supplies Kit Dispense based on patient and insurance preference. ICD-10: E11.9   glucose blood test strip Use as instructed to check blood sugar before meals and at bedtime What changed:    how much to take  how to take this  when to take this   hydrochlorothiazide 25 MG tablet Commonly known as:  HYDRODIURIL TAKE 1 TABLET BY MOUTH EVERY DAY   insulin aspart 100 UNIT/ML FlexPen Commonly known as:  NovoLOG FlexPen Inject 22 Units into the skin 3 (three) times daily with meals AND 4 Units with snacks.   Insulin Pen Needle 32G X 4 MM Misc 1 pen by Does not apply route daily.   Levemir FlexTouch 100 UNIT/ML Pen Generic drug:  Insulin Detemir 45 Units.   losartan 100 MG tablet Commonly known as:  COZAAR Take 1 tablet (100 mg total) by mouth daily.   metoCLOPramide 5 MG tablet Commonly known as:  REGLAN TAKE 1 TABLET BY MOUTH 3 TIMES A DAY BEFORE MEALS   onetouch ultrasoft lancets Use as instructed   pantoprazole 40 MG tablet Commonly known as:  PROTONIX TAKE 1 TABLET BY MOUTH EVERY DAY   rosuvastatin 20 MG tablet Commonly known as:  CRESTOR TAKE 1 TABLET BY MOUTH EVERY DAY   Viagra 100 MG tablet Generic drug:  sildenafil TAKE 1 TABLET 1 HOUR BEFORE INTERCOURSE         DATA  REVIEWED:  Lab Results  Component Value Date   HGBA1C 9.5 (H) 04/06/2019   HGBA1C 8.9 (A) 01/26/2019   HGBA1C 8.3 (H) 10/04/2018   Lab Results  Component Value Date   MICROALBUR 1.7 04/06/2019   LDLCALC 72 04/06/2019   CREATININE 0.88 04/06/2019   Lab Results  Component Value Date   MICRALBCREAT 24 04/06/2019     Lab Results  Component Value Date   CHOL 148 04/06/2019   HDL 47 04/06/2019   LDLCALC 72 04/06/2019   TRIG 196 (H) 04/06/2019  CHOLHDL 3.1 04/06/2019         ASSESSMENT / PLAN / RECOMMENDATIONS:   1) Type 2 Diabetes Mellitus, Poorly controlled, With neuropathic complications - Most recent A1c of 8.9 %. Goal A1c < 7.0 %.   Plan:  - His BG's continued to be elevated but they are more steady then they were in the past, there's still some more insulin-CHo mismatch but not as bad.  - He is trying to stay more active now, that the weather is getting warmer.   - We have discussed in the past adding an insulin sensitizer such as Metformin. But patient would like to avoid this due to a long history of intermittent GI issues, so will avoid this.    MEDICATIONS:  Increase  Basaglar to 50 units daily   Continue  Novolog to 22 units TID QAC   EDUCATION / INSTRUCTIONS:  BG monitoring instructions: Patient is instructed to check his blood sugars 4 times a day, before meals and bedtime  Call [facility] if: BG persistently < 70 or > 300. . I reviewed the Rule of 15 for the treatment of hypoglycemia in detail with the patient. Literature supplied.  2) Diabetic complications:   Eye: Does not have known diabetic retinopathy. He is scheduled for 05/2019  Neuro/ Feet: Does have known diabetic peripheral neuropathy.  Renal: Patient does have known baseline CKD. He is on an ACEI/ARB at present.   3) Lipids: Patient is on crestor 20 mg daily. LDL at goal.    4) Hypertension: Historically he has been at goal of < 140/90 mmHg.   I discussed  the assessment and treatment plan with the patient. The patient was provided an opportunity to ask questions and all were answered. The patient agreed with the plan and demonstrated an understanding of the instructions.   The patient was advised to call back or seek an in-person evaluation if the symptoms worsen or if the condition fails to improve as anticipated.  I provided 17 minutes of non-face-to-face time during this encounter.   F/U in 3 months   Signed electronically by: Mack Guise, MD  [facility] Endocrinology  [facility] [address] [place], [place] [address] Phone: [phone] FAX: [phone]   CC: Elby Showers, MD [address] [address] Phone: [phone]  Fax: [phone]  Return to Endocrinology clinic as below: Future Appointments  Date Time Provider [place]  04/28/2019 10:30 AM , Melanie Crazier, MD [facility] None  06/06/2019  2:00 PM Baxley, Cresenciano Lick, MD [facility]

## 2019-05-19 ENCOUNTER — Telehealth: Payer: Self-pay | Admitting: Cardiovascular Disease

## 2019-05-19 NOTE — Telephone Encounter (Signed)
LVM for pt to call and schedule followup with Dr. Fletcher Anon.

## 2019-05-23 ENCOUNTER — Other Ambulatory Visit: Payer: Self-pay | Admitting: Internal Medicine

## 2019-05-23 ENCOUNTER — Telehealth: Payer: Self-pay | Admitting: Internal Medicine

## 2019-05-23 NOTE — Telephone Encounter (Signed)
Joseph Dixon,    Joseph Dixon dropped off his glucose data, he continues to have inconsistent carbohydrates (CHO)  in his meals. Sometimes  He eats a lot of CHO with a meal and other times he eats perfect amounts, that's why his sugars go from 80's to 200's.    We will not be able to make any changes to his insulin regimen. Just advised him to do his best with being consistent.       Thanks    Abby Nena Jordan, MD  [facility] Endocrinology  [facility] [address] [place], [place] [address] Phone: [phone] FAX: [phone]

## 2019-05-23 NOTE — Telephone Encounter (Signed)
lft vm for pt to return call to discuss and to offer pt # to [facility] for appt.

## 2019-05-25 NOTE — Telephone Encounter (Signed)
Pt aware.

## 2019-05-29 ENCOUNTER — Encounter: Payer: Self-pay | Admitting: Internal Medicine

## 2019-05-29 DIAGNOSIS — E119 Type 2 diabetes mellitus without complications: Secondary | ICD-10-CM | POA: Diagnosis not present

## 2019-05-29 DIAGNOSIS — H18413 Arcus senilis, bilateral: Secondary | ICD-10-CM | POA: Diagnosis not present

## 2019-05-29 DIAGNOSIS — H11153 Pinguecula, bilateral: Secondary | ICD-10-CM | POA: Diagnosis not present

## 2019-05-29 DIAGNOSIS — Z794 Long term (current) use of insulin: Secondary | ICD-10-CM | POA: Diagnosis not present

## 2019-05-29 DIAGNOSIS — H35433 Paving stone degeneration of retina, bilateral: Secondary | ICD-10-CM | POA: Diagnosis not present

## 2019-05-29 DIAGNOSIS — H2513 Age-related nuclear cataract, bilateral: Secondary | ICD-10-CM | POA: Diagnosis not present

## 2019-05-29 DIAGNOSIS — H25013 Cortical age-related cataract, bilateral: Secondary | ICD-10-CM | POA: Diagnosis not present

## 2019-05-29 DIAGNOSIS — Z79899 Other long term (current) drug therapy: Secondary | ICD-10-CM | POA: Diagnosis not present

## 2019-05-29 LAB — HM DIABETES EYE EXAM

## 2019-06-06 ENCOUNTER — Other Ambulatory Visit: Payer: Self-pay

## 2019-06-06 ENCOUNTER — Ambulatory Visit (INDEPENDENT_AMBULATORY_CARE_PROVIDER_SITE_OTHER): Payer: Medicare HMO | Admitting: Internal Medicine

## 2019-06-06 ENCOUNTER — Encounter: Payer: Self-pay | Admitting: Internal Medicine

## 2019-06-06 VITALS — BP 138/70 | HR 54 | Temp 97.0°F | Ht 69.0 in | Wt 166.0 lb

## 2019-06-06 DIAGNOSIS — E119 Type 2 diabetes mellitus without complications: Secondary | ICD-10-CM | POA: Diagnosis not present

## 2019-06-06 DIAGNOSIS — E1143 Type 2 diabetes mellitus with diabetic autonomic (poly)neuropathy: Secondary | ICD-10-CM

## 2019-06-06 DIAGNOSIS — E785 Hyperlipidemia, unspecified: Secondary | ICD-10-CM | POA: Diagnosis not present

## 2019-06-06 DIAGNOSIS — E1165 Type 2 diabetes mellitus with hyperglycemia: Secondary | ICD-10-CM | POA: Diagnosis not present

## 2019-06-06 DIAGNOSIS — I351 Nonrheumatic aortic (valve) insufficiency: Secondary | ICD-10-CM | POA: Diagnosis not present

## 2019-06-06 DIAGNOSIS — Z0000 Encounter for general adult medical examination without abnormal findings: Secondary | ICD-10-CM

## 2019-06-06 DIAGNOSIS — I10 Essential (primary) hypertension: Secondary | ICD-10-CM

## 2019-06-06 DIAGNOSIS — E781 Pure hyperglyceridemia: Secondary | ICD-10-CM

## 2019-06-06 DIAGNOSIS — E1169 Type 2 diabetes mellitus with other specified complication: Secondary | ICD-10-CM

## 2019-06-06 DIAGNOSIS — K3184 Gastroparesis: Secondary | ICD-10-CM

## 2019-06-06 DIAGNOSIS — Z794 Long term (current) use of insulin: Secondary | ICD-10-CM

## 2019-06-06 DIAGNOSIS — IMO0001 Reserved for inherently not codable concepts without codable children: Secondary | ICD-10-CM

## 2019-06-06 LAB — POCT URINALYSIS DIP (CLINITEK)
Bilirubin, UA: NEGATIVE
Blood, UA: NEGATIVE
Glucose, UA: NEGATIVE mg/dL
Ketones, POC UA: NEGATIVE mg/dL
Leukocytes, UA: NEGATIVE
Nitrite, UA: NEGATIVE
POC PROTEIN,UA: NEGATIVE
Spec Grav, UA: 1.015 (ref 1.010–1.025)
Urobilinogen, UA: 1 E.U./dL
pH, UA: 5 (ref 5.0–8.0)

## 2019-06-12 ENCOUNTER — Other Ambulatory Visit: Payer: Medicare HMO | Admitting: Internal Medicine

## 2019-06-12 ENCOUNTER — Other Ambulatory Visit: Payer: Self-pay

## 2019-06-12 DIAGNOSIS — E119 Type 2 diabetes mellitus without complications: Secondary | ICD-10-CM

## 2019-06-12 DIAGNOSIS — Z794 Long term (current) use of insulin: Secondary | ICD-10-CM

## 2019-06-12 NOTE — Progress Notes (Signed)
Lab only 

## 2019-06-13 LAB — HEMOGLOBIN A1C
Hgb A1c MFr Bld: 8.1 % of total Hgb — ABNORMAL HIGH (ref <5.7)
Mean Plasma Glucose: 186 (calc)
eAG (mmol/L): 10.3 (calc)

## 2019-06-20 ENCOUNTER — Encounter: Payer: Self-pay | Admitting: Internal Medicine
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
## 2019-06-20 NOTE — Patient Instructions (Signed)
It is important for you to take better care of your diabetes.  Continue current medications.  Watch diet and get some exercise.  Hemoglobin A1c is not acceptable at this time.  Continue to see endocrinologist and follow-up in 6 months.

## 2019-06-27 ENCOUNTER — Telehealth: Payer: Self-pay

## 2019-06-27 NOTE — Telephone Encounter (Signed)
Left message to call me back, needs Welcome to Medicare EKG and pneumococcal 23.

## 2019-06-28 ENCOUNTER — Other Ambulatory Visit: Payer: Self-pay | Admitting: Internal Medicine

## 2019-07-07 ENCOUNTER — Encounter: Payer: Self-pay | Admitting: Internal Medicine

## 2019-07-07 ENCOUNTER — Other Ambulatory Visit: Payer: Self-pay

## 2019-07-07 ENCOUNTER — Ambulatory Visit (INDEPENDENT_AMBULATORY_CARE_PROVIDER_SITE_OTHER): Payer: Medicare HMO | Admitting: Internal Medicine

## 2019-07-07 VITALS — BP 134/80 | HR 74 | Temp 98.6°F | Ht 69.0 in | Wt 166.0 lb

## 2019-07-07 DIAGNOSIS — Z23 Encounter for immunization: Secondary | ICD-10-CM | POA: Diagnosis not present

## 2019-07-07 DIAGNOSIS — Z0000 Encounter for general adult medical examination without abnormal findings: Secondary | ICD-10-CM | POA: Diagnosis not present

## 2019-07-07 NOTE — Progress Notes (Signed)
Pt received Prevnar 13 from [facility] and Welcome to Medicare EKG was forgotten at last visit  In June and done today  is WNL

## 2019-07-07 NOTE — Patient Instructions (Signed)
Prevnar 13 given. Welcome to Medicare EKG is WNL

## 2019-07-10 ENCOUNTER — Encounter: Payer: Self-pay | Admitting: Internal Medicine

## 2019-07-24 ENCOUNTER — Ambulatory Visit (INDEPENDENT_AMBULATORY_CARE_PROVIDER_SITE_OTHER): Payer: Medicare HMO | Admitting: Internal Medicine

## 2019-07-24 ENCOUNTER — Encounter: Payer: Self-pay | Admitting: Internal Medicine

## 2019-07-24 ENCOUNTER — Other Ambulatory Visit: Payer: Self-pay

## 2019-07-24 VITALS — BP 122/68 | HR 66 | Temp 97.7°F | Ht 69.0 in | Wt 168.6 lb

## 2019-07-24 DIAGNOSIS — Z794 Long term (current) use of insulin: Secondary | ICD-10-CM

## 2019-07-24 DIAGNOSIS — E1142 Type 2 diabetes mellitus with diabetic polyneuropathy: Secondary | ICD-10-CM

## 2019-07-24 DIAGNOSIS — E1165 Type 2 diabetes mellitus with hyperglycemia: Secondary | ICD-10-CM | POA: Diagnosis not present

## 2019-07-24 MED ORDER — NOVOLOG FLEXPEN 100 UNIT/ML ~~LOC~~ SOPN: 25.0000 [IU] | PEN_INJECTOR | Freq: Three times a day (TID) | SUBCUTANEOUS | 11 refills | Status: DC

## 2019-07-24 NOTE — Patient Instructions (Signed)
-   Continue Basaglar 50units  - Increase Novolog to 25 units with each meal    Choose healthy, lower carb lower calorie snacks: toss salad, vegetables, cottage cheese, low fat cheese / string cheese, lower sodium deli meat, tuna salad or chicken salad     HOW TO TREAT LOW BLOOD SUGARS (Blood sugar LESS THAN 70 MG/DL)  Please follow the RULE OF 15 for the treatment of hypoglycemia treatment (when your (blood sugars are less than 70 mg/dL)    STEP 1: Take 15 grams of carbohydrates when your blood sugar is low, which includes:   3-4 GLUCOSE TABS  OR  3-4 OZ OF JUICE OR REGULAR SODA OR  ONE TUBE OF GLUCOSE GEL     STEP 2: RECHECK blood sugar in 15 MINUTES STEP 3: If your blood sugar is still low at the 15 minute recheck --> then, go back to STEP 1 and treat AGAIN with another 15 grams of carbohydrates.     Marland Kitchen

## 2019-07-24 NOTE — Progress Notes (Signed)
Name: Joseph Dixon  Age/ Sex: 68 y.o., male   MRN/ DOB: [id number], July 15, 1951     PCP: Joseph Showers, MD   Reason for Endocrinology Evaluation: Type 2 Diabetes Mellitus  Initial Endocrine Consultative Visit: 11/29/2018    PATIENT IDENTIFIER: [name] is a 68 y.o. male with a past medical history of DM, Oartic insufficiency and hyperlipidemia . The patient has followed with Endocrinology clinic since 11/29/2018 for consultative assistance with management of his diabetes.  DIABETIC HISTORY:  [name] was diagnosed with T2DM ~ 2009, he does not recall any anti-glycemic agents he has been on, he has been on insulin for many years.  His hemoglobin A1c has ranged from 8.0% in 2019, peaking at 12.4% in 2016.   Pt with chronic intermittent Hx of diarrhea. That at times he attributes to Hx  ETOH intake.   SUBJECTIVE:   During the last visit (04/28/2019): We increased  basaglar to 50 units daily and continued Novolog  22 units TIDQAC , he was not taking the bedtime snack.    Today (07/24/2019): [name] is here for a 3 week follow up on his diabetes management.  He checks his blood sugars 3-4 times daily, preprandial and bedtime. The patient has not had hypoglycemic episodes since the last clinic visit.he claims that he stopped eating a snack  at bedtime but upon further questioning the patient does admit to drinking milk and sometimes having crackers at bedtime. He also has been active renovating his house.  Otherwise, the patient has not required any recent emergency interventions for hypoglycemia and has not had recent hospitalizations secondary to hyper or hypoglycemic episodes.    ROS: As per HPI and as detailed below: Review of Systems  Constitutional: Negative for fever and weight loss.  HENT: Negative for congestion and sore throat.   Respiratory: Negative for cough and shortness of breath.   Cardiovascular: Negative for chest pain and palpitations.      HOME  DIABETES REGIMEN:  Basaglar 50 units daily  Novolog 22 units TID QAC   GLUCOSE LOG : BG range 88- > 200 mg/dL     DIABETIC COMPLICATIONS: Microvascular complications:   Neuropathy   Denies: retinopathy, nephropathy   Last eye exam: Completed 02/2018  Macrovascular complications:   Denies: CAD, PVD, CVA    HISTORY:  Past Medical History:  Past Medical History:  Diagnosis Date  . Cancer (HCC)    basal cell both shoulders  . Diabetes mellitus    insulin dependant  . Hyperlipidemia    Past Surgical History:  Past Surgical History:  Procedure Laterality Date  . repair of rt knee laceration      Social History:  reports that he has quit smoking. He has never used smokeless tobacco. He reports that he does not drink alcohol or use drugs. Family History:  Family History  Problem Relation Age of Onset  . Heart disease Father   . Diabetes Father   . Mental illness Father   . Alzheimer's disease Father   . COPD Brother   . Diabetes Mother   . Alzheimer's disease Mother   . Colon cancer Neg Hx      HOME MEDICATIONS: Allergies as of 07/24/2019      Reactions   Sulfa Antibiotics    hallucinations      Medication List       Accurate as of July 24, 2019  8:13 AM. If you have any questions, ask your  nurse or doctor.        ALPRAZolam 0.25 MG tablet Commonly known as: XANAX TAKE 1 TABLET BY MOUTH AT BEDTIME   aspirin EC 81 MG tablet Take 1 tablet (81 mg total) by mouth daily.   Basaglar KwikPen 100 UNIT/ML Sopn Inject 0.5 mLs (50 Units total) into the skin daily. What changed: how much to take   blood glucose meter kit and supplies Kit Dispense based on patient and insurance preference. ICD-10: E11.9   glucose blood test strip Use as instructed to check blood sugar before meals and at bedtime What changed:   how much to take  how to take this  when to take this   hydrochlorothiazide 25 MG tablet Commonly known as: HYDRODIURIL TAKE 1 TABLET  BY MOUTH EVERY DAY   Insulin Pen Needle 32G X 4 MM Misc 1 pen by Does not apply route daily.   losartan 100 MG tablet Commonly known as: COZAAR Take 1 tablet (100 mg total) by mouth daily.   metoCLOPramide 5 MG tablet Commonly known as: REGLAN TAKE 1 TABLET BY MOUTH 3 TIMES A DAY BEFORE MEALS   NovoLOG FlexPen 100 UNIT/ML FlexPen Generic drug: insulin aspart INJECT 25 UNITS INTO THE SKIN 3 TIMES DAILY WITH MEALS.   onetouch ultrasoft lancets Use as instructed   pantoprazole 40 MG tablet Commonly known as: PROTONIX TAKE 1 TABLET BY MOUTH EVERY DAY   rosuvastatin 20 MG tablet Commonly known as: CRESTOR TAKE 1 TABLET BY MOUTH EVERY DAY   Viagra 100 MG tablet Generic drug: sildenafil TAKE 1 TABLET 1 HOUR BEFORE INTERCOURSE         DATA REVIEWED:  Lab Results  Component Value Date   HGBA1C 8.1 (H) 06/12/2019   HGBA1C 9.5 (H) 04/06/2019   HGBA1C 8.9 (A) 01/26/2019   Lab Results  Component Value Date   MICROALBUR 1.7 04/06/2019   LDLCALC 72 04/06/2019   CREATININE 0.88 04/06/2019   Lab Results  Component Value Date   MICRALBCREAT 24 04/06/2019     Lab Results  Component Value Date   CHOL 148 04/06/2019   HDL 47 04/06/2019   LDLCALC 72 04/06/2019   TRIG 196 (H) 04/06/2019   CHOLHDL 3.1 04/06/2019         ASSESSMENT / PLAN / RECOMMENDATIONS:   1) Type 2 Diabetes Mellitus, Sub-Optimally Controlled, With neuropathic complications - Most recent A1c of 8.1 %. Goal A1c < 7.0 %.   Plan:   - His A1c is has improved but he continues with variable glucose, this is again most likely related to insulin-CHO mismatch, as well as variability in activity level.  - We did discuss carbohydrate sensitivity and how drinking milk and eating "few crackers" could increase glucose , we did again emphasize the importance of insulin-CHO match  - I have discussed add-on therapy in the past and again today, due to severe GI issues in the past , he is not a candidate for  metformin nor GLp-1 agonists. We discussed SGLT-2 inhibitors we discussed benefits and risks but he would like to continue with just insulin at this time.  - Will increase novolog due to recent consistent increase Bg's during the day     MEDICATIONS:  Continue Basaglar  50 units daily   Increase Novolog to 25 units TID QAC   EDUCATION / INSTRUCTIONS:  BG monitoring instructions: Patient is instructed to check his blood sugars 4 times a day, before meals and bedtime  Call [facility] if: BG  persistently < 70 or > 300. . I reviewed the Rule of 15 for the treatment of hypoglycemia in detail with the patient. Literature supplied.  2) Diabetic complications:   Eye: Does not have known diabetic retinopathy. Last exam  05/2019  Neuro/ Feet: Does have known diabetic peripheral neuropathy.  Renal: Patient does have known baseline CKD. He is on an ACEI/ARB at present.       F/U in 3 months   Signed electronically by: Mack Guise, MD  [facility]  [address] [place], [place] [address] Phone: [phone] FAX: [phone]   CC: Joseph Showers, MD [address] [address] Phone: [phone]  Fax: [phone]  Return to Endocrinology clinic as below: Future Appointments  Date Time Provider [place]  08/15/2019 10:00 AM Wellington Hampshire, MD [facility]

## 2019-08-15 ENCOUNTER — Other Ambulatory Visit: Payer: Self-pay

## 2019-08-15 ENCOUNTER — Encounter: Payer: Self-pay | Admitting: Cardiovascular Disease

## 2019-08-15 ENCOUNTER — Ambulatory Visit: Payer: Medicare HMO | Admitting: Cardiovascular Disease

## 2019-08-15 VITALS — BP 116/66 | HR 77 | Ht 69.0 in | Wt 169.5 lb

## 2019-08-15 DIAGNOSIS — E785 Hyperlipidemia, unspecified: Secondary | ICD-10-CM

## 2019-08-15 DIAGNOSIS — I10 Essential (primary) hypertension: Secondary | ICD-10-CM

## 2019-08-15 DIAGNOSIS — I739 Peripheral vascular disease, unspecified: Secondary | ICD-10-CM | POA: Diagnosis not present

## 2019-08-15 NOTE — Progress Notes (Signed)
Cardiology Office Note   Date:  08/15/2019   ID:  Joseph Dixon, DOB 1951-05-22, MRN [id number]  PCP:  Elby Showers, MD  Cardiologist:   Kathlyn Sacramento, MD   No chief complaint on file.     History of Present Illness: Joseph Dixon is a 68 y.o. male who is here today  for evaluation and management of peripheral arterial disease.  The patient has known history of diabetes mellitus, hypertension and hyperlipidemia.  He has no previous cardiac history and no prior history of peripheral arterial disease.  He is not a smoker and has no family history of premature coronary artery disease. The patient was seen last year for a slow healing  small ulceration on the left big to. He underwent noninvasive vascular evaluation which showed near normal ABI.  Duplex showed mainly tibial disease with an occluded anterior tibial artery on the right and an occluded anterior tibial and peroneal arteries on the left. The ulceration has improved significantly and almost completely healed.  There continues to be a small 1 mm area that is open with occasional drainage but no warmth or discoloration. He continues to deny claudication.  No chest pain or shortness of breath.    Past Medical History:  Diagnosis Date  . Cancer (HCC)    basal cell both shoulders  . Diabetes mellitus    insulin dependant  . Hyperlipidemia     Past Surgical History:  Procedure Laterality Date  . repair of rt knee laceration       Current Outpatient Medications  Medication Sig Dispense Refill  . ALPRAZolam (XANAX) 0.25 MG tablet TAKE 1 TABLET BY MOUTH AT BEDTIME 90 tablet 0  . aspirin EC 81 MG tablet Take 1 tablet (81 mg total) by mouth daily. 90 tablet 3  . blood glucose meter kit and supplies KIT Dispense based on patient and insurance preference. ICD-10: E11.9 1 each 0  . glucose blood test strip Use as instructed to check blood sugar before meals and at bedtime (Patient taking differently: 1 each by Other route 2  (two) times daily. Use as instructed to check blood sugar before meals and at bedtime) 200 each 12  . hydrochlorothiazide (HYDRODIURIL) 25 MG tablet TAKE 1 TABLET BY MOUTH EVERY DAY 90 tablet 1  . insulin aspart (NOVOLOG FLEXPEN) 100 UNIT/ML FlexPen Inject 25 Units into the skin 3 (three) times daily with meals. 24 mL 11  . Insulin Glargine (BASAGLAR KWIKPEN) 100 UNIT/ML SOPN Inject 0.5 mLs (50 Units total) into the skin daily. 15 mL 6  . Insulin Pen Needle 32G X 4 MM MISC 1 pen by Does not apply route daily. 100 each 6  . Lancets (ONETOUCH ULTRASOFT) lancets Use as instructed 200 each 12  . losartan (COZAAR) 100 MG tablet Take 1 tablet (100 mg total) by mouth daily. 90 tablet 1  . metoCLOPramide (REGLAN) 5 MG tablet TAKE 1 TABLET BY MOUTH 3 TIMES A DAY BEFORE MEALS 270 tablet 1  . pantoprazole (PROTONIX) 40 MG tablet TAKE 1 TABLET BY MOUTH EVERY DAY 90 tablet 3  . rosuvastatin (CRESTOR) 20 MG tablet TAKE 1 TABLET BY MOUTH EVERY DAY 90 tablet 3  . VIAGRA 100 MG tablet TAKE 1 TABLET 1 HOUR BEFORE INTERCOURSE 4 tablet 11   No current facility-administered medications for this visit.     Allergies:   Sulfa antibiotics    Social History:  The patient  reports that he has quit smoking. He has never  used smokeless tobacco. He reports that he does not drink alcohol or use drugs.   Family History:  The patient's family history includes Alzheimer's disease in his father and mother; COPD in his brother; Diabetes in his father and mother; Heart disease in his father; Mental illness in his father.    ROS:  Please see the history of present illness.   Otherwise, review of systems are positive for none.   All other systems are reviewed and negative.    PHYSICAL EXAM: VS:  BP 116/66   Pulse 77   Ht _0  (1.753 m)   Wt 169 lb 8 oz (76.9 kg)   SpO2 99%   BMI 25.03 kg/m  , BMI Body mass index is 25.03 kg/m. GEN: Well nourished, well developed, in no acute distress  HEENT: normal  Neck: no JVD,  carotid bruits, or masses Cardiac: RRR; no murmurs, rubs, or gallops,no edema  Respiratory:  clear to auscultation bilaterally, normal work of breathing GI: soft, nontender, nondistended, + BS MS: no deformity or atrophy  Skin: warm and dry, no rash Neuro:  Strength and sensation are intact Psych: euthymic mood, full affect Vascular: Femoral pulses normal bilaterally.  Posterior tibial is +1 bilaterally.  Dorsalis pedis is not palpable.   EKG:  EKG is not ordered today.   Recent Labs: 04/06/2019: ALT 19; BUN 25; Creat 0.88; Hemoglobin 15.0; Platelets 240; Potassium 4.6; Sodium 139    Lipid Panel    Component Value Date/Time   CHOL 148 04/06/2019 0922   TRIG 196 (H) 04/06/2019 0922   HDL 47 04/06/2019 0922   CHOLHDL 3.1 04/06/2019 0922   VLDL 49 (H) 03/25/2017 1056   LDLCALC 72 04/06/2019 0922      Wt Readings from Last 3 Encounters:  08/15/19 169 lb 8 oz (76.9 kg)  07/24/19 168 lb 9.6 oz (76.5 kg)  07/07/19 166 lb (75.3 kg)       No flowsheet data found.    ASSESSMENT AND PLAN:  1.  Peripheral arterial disease: Small ulceration on the left big toe almost completely healed.  The patient has no claudication.   The patient has mainly below the knee disease with an occluded anterior tibial and peroneal arteries on the left.  His posterior tibial artery seems to be close to normal given that he has a palpable pulse.  His ABI was also close to normal.  Continue to monitor the area closely as there continues to be a small breakdown in the superficial skin.  He will notify me if there is any worsening.  Otherwise follow-up in 9 months.  2.  Essential hypertension: Blood pressure is reasonably controlled.  3.  Hyperlipidemia: I agree with treatment with rosuvastatin with a target LDL of less than 70.  Most recent LDL was 72.  Triglyceride was mildly elevated at 196 but hopefully that will improve with improvement in his diabetes.  I also discussed with him the importance of  healthy diet to improve this.    Disposition:   FU with me in 9 months  Signed,  Kathlyn Sacramento, MD  08/15/2019 10:39 AM    [place]

## 2019-08-15 NOTE — Patient Instructions (Signed)
Medication Instructions:  Your physician recommends that you continue on your current medications as directed. Please refer to the Current Medication list given to you today.  If you need a refill on your cardiac medications before your next appointment, please call your pharmacy.   Lab work: None ordered If you have labs (blood work) drawn today and your tests are completely normal, you will receive your results only by: [place] (if you have MyChart) OR A paper copy in the mail If you have any lab test that is abnormal or we need to change your treatment, we will call you to review the results.  Testing/Procedures: None ordered  Follow-Up: At [facility], you and your health needs are our priority.  As part of our continuing mission to provide you with exceptional heart care, we have created designated Provider Care Teams.  These Care Teams include your primary Cardiologist (physician) and Advanced Practice Providers (APPs -  Physician Assistants and Nurse Practitioners) who all work together to provide you with the care you need, when you need it. You will need a follow up appointment in 9 months.  Please call our office 2 months in advance to schedule this appointment.  You may see Kathlyn Sacramento, MD or one of the following Advanced Practice Providers on your designated Care Team:   Kerin Ransom, PA-C [address] PA-C [place], [place]

## 2019-09-20 ENCOUNTER — Other Ambulatory Visit: Payer: Self-pay | Admitting: Internal Medicine

## 2019-10-01 ENCOUNTER — Other Ambulatory Visit: Payer: Self-pay | Admitting: Internal Medicine

## 2019-10-06 ENCOUNTER — Other Ambulatory Visit: Payer: Self-pay | Admitting: Internal Medicine

## 2019-10-06 DIAGNOSIS — R69 Illness, unspecified: Secondary | ICD-10-CM | POA: Diagnosis not present

## 2019-10-19 ENCOUNTER — Encounter: Payer: Self-pay | Admitting: Internal Medicine

## 2019-10-25 ENCOUNTER — Ambulatory Visit: Payer: Medicare HMO | Admitting: Internal Medicine

## 2019-10-25 NOTE — Progress Notes (Deleted)
Name: Joseph Dixon  Age/ Sex: 68 y.o., male   MRN/ DOB: [id number], 11/27/1951     PCP: Elby Showers, MD   Reason for Endocrinology Evaluation: Type 2 Diabetes Mellitus  Initial Endocrine Consultative Visit: 11/29/2018    PATIENT IDENTIFIER: Joseph Dixon is a 68 y.o. male with a past medical history of DM, Oartic insufficiency and hyperlipidemia . The patient has followed with Endocrinology clinic since 11/29/2018 for consultative assistance with management of his diabetes.  DIABETIC HISTORY:  Joseph Dixon was diagnosed with T2DM ~ 2009, he does not recall any anti-glycemic agents he has been on, he has been on insulin for many years.  His hemoglobin A1c has ranged from 8.0% in 2019, peaking at 12.4% in 2016.   Pt with chronic intermittent Hx of diarrhea. That at times he attributes to Hx  ETOH intake.   SUBJECTIVE:   During the last visit (07/24/2019): We   basaglar  50 units daily and increased  Novolog  25 units TIDQAC   Today (10/25/2019): Joseph Dixon is here for a 3 week follow up on his diabetes management.  He checks his blood sugars 3-4 times daily, preprandial and bedtime. The patient has not had hypoglycemic episodes since the last clinic visit.he claims that he stopped eating a snack  at bedtime but upon further questioning the patient does admit to drinking milk and sometimes having crackers at bedtime. He also has been active renovating his house.  Otherwise, the patient has not required any recent emergency interventions for hypoglycemia and has not had recent hospitalizations secondary to hyper or hypoglycemic episodes.    ROS: As per HPI and as detailed below: Review of Systems  Constitutional: Negative for fever and weight loss.  HENT: Negative for congestion and sore throat.   Respiratory: Negative for cough and shortness of breath.   Cardiovascular: Negative for chest pain and palpitations.      HOME DIABETES REGIMEN:  Basaglar 50 units daily  Novolog 25  units TID QAC   GLUCOSE LOG : BG range 88- > 200 mg/dL     DIABETIC COMPLICATIONS: Microvascular complications:   Neuropathy   Denies: retinopathy, nephropathy   Last eye exam: Completed 02/2018  Macrovascular complications:   Denies: CAD, PVD, CVA    HISTORY:  Past Medical History:  Past Medical History:  Diagnosis Date  . Cancer (HCC)    basal cell both shoulders  . Diabetes mellitus    insulin dependant  . Hyperlipidemia    Past Surgical History:  Past Surgical History:  Procedure Laterality Date  . repair of rt knee laceration      Social History:  reports that he has quit smoking. He has never used smokeless tobacco. He reports that he does not drink alcohol or use drugs. Family History:  Family History  Problem Relation Age of Onset  . Heart disease Father   . Diabetes Father   . Mental illness Father   . Alzheimer's disease Father   . COPD Brother   . Diabetes Mother   . Alzheimer's disease Mother   . Colon cancer Neg Hx      HOME MEDICATIONS: Allergies as of 10/25/2019      Reactions   Sulfa Antibiotics    hallucinations      Medication List       Accurate as of October 25, 2019  7:23 AM. If you have any questions, ask your nurse or doctor.  ALPRAZolam 0.25 MG tablet Commonly known as: XANAX TAKE 1 TABLET BY MOUTH EVERYDAY AT BEDTIME   aspirin EC 81 MG tablet Take 1 tablet (81 mg total) by mouth daily.   Basaglar KwikPen 100 UNIT/ML Sopn Inject 0.5 mLs (50 Units total) into the skin daily.   blood glucose meter kit and supplies Kit Dispense based on patient and insurance preference. ICD-10: E11.9   glucose blood test strip Use as instructed to check blood sugar before meals and at bedtime What changed:   how much to take  how to take this  when to take this   hydrochlorothiazide 25 MG tablet Commonly known as: HYDRODIURIL TAKE 1 TABLET BY MOUTH EVERY DAY   Insulin Pen Needle 32G X 4 MM Misc 1 pen by Does not  apply route daily.   losartan 100 MG tablet Commonly known as: COZAAR TAKE 1 TABLET BY MOUTH EVERY DAY   metoCLOPramide 5 MG tablet Commonly known as: REGLAN TAKE 1 TABLET BY MOUTH 3 TIMES A DAY BEFORE MEALS   NovoLOG FlexPen 100 UNIT/ML FlexPen Generic drug: insulin aspart Inject 25 Units into the skin 3 (three) times daily with meals.   onetouch ultrasoft lancets Use as instructed   pantoprazole 40 MG tablet Commonly known as: PROTONIX TAKE 1 TABLET BY MOUTH EVERY DAY   rosuvastatin 20 MG tablet Commonly known as: CRESTOR TAKE 1 TABLET BY MOUTH EVERY DAY   Viagra 100 MG tablet Generic drug: sildenafil TAKE 1 TABLET 1 HOUR BEFORE INTERCOURSE         DATA REVIEWED:  Lab Results  Component Value Date   HGBA1C 8.1 (H) 06/12/2019   HGBA1C 9.5 (H) 04/06/2019   HGBA1C 8.9 (A) 01/26/2019   Lab Results  Component Value Date   MICROALBUR 1.7 04/06/2019   LDLCALC 72 04/06/2019   CREATININE 0.88 04/06/2019   Lab Results  Component Value Date   MICRALBCREAT 24 04/06/2019     Lab Results  Component Value Date   CHOL 148 04/06/2019   HDL 47 04/06/2019   LDLCALC 72 04/06/2019   TRIG 196 (H) 04/06/2019   CHOLHDL 3.1 04/06/2019         ASSESSMENT / PLAN / RECOMMENDATIONS:   1) Type 2 Diabetes Mellitus, Sub-Optimally Controlled, With neuropathic complications - Most recent A1c of 8.1 %. Goal A1c < 7.0 %.   Plan:   - His A1c is has improved but he continues with variable glucose, this is again most likely related to insulin-CHO mismatch, as well as variability in activity level.  - We did discuss carbohydrate sensitivity and how drinking milk and eating "few crackers" could increase glucose , we did again emphasize the importance of insulin-CHO match  - I have discussed add-on therapy in the past and again today, due to severe GI issues in the past , he is not a candidate for metformin nor GLp-1 agonists. We discussed SGLT-2 inhibitors we discussed benefits and  risks but he would like to continue with just insulin at this time.  - Will increase novolog due to recent consistent increase Bg's during the day     MEDICATIONS:  Continue Basaglar  50 units daily   Novolog to 25 units TID QAC   EDUCATION / INSTRUCTIONS:  BG monitoring instructions: Patient is instructed to check his blood sugars 4 times a day, before meals and bedtime  Call [facility] if: BG persistently < 70 or > 300. . I reviewed the Rule of 15 for the treatment of  hypoglycemia in detail with the patient. Literature supplied.  2) Diabetic complications:   Eye: Does not have known diabetic retinopathy. Last exam  05/2019  Neuro/ Feet: Does have known diabetic peripheral neuropathy.  Renal: Patient does have known baseline CKD. He is on an ACEI/ARB at present.       F/U in 3 months   Signed electronically by: Mack Guise, MD  [facility] Endocrinology  [facility] [address] [place], [place] [address] Phone: [phone] FAX: [phone]   CC: Elby Showers, MD [address] [address] Phone: [phone]  Fax: [phone]  Return to Endocrinology clinic as below: Future Appointments  Date Time Provider [place]  10/25/2019  8:30 AM Shamleffer, Melanie Crazier, MD [facility] None

## 2019-11-06 ENCOUNTER — Telehealth: Payer: Self-pay

## 2019-11-06 NOTE — Telephone Encounter (Signed)
Patient called in staring he can't afford the insulin and would like to see if we could find something for him in his price range     Please call and advise

## 2019-11-06 NOTE — Telephone Encounter (Signed)
Dr. Maretta Bees please advise-  Pt states he is in the donut hole and he is not able to afford his novolog or his basaglar. States it will cost over $300 out of pocket.

## 2019-11-07 NOTE — Telephone Encounter (Signed)
Called pt, he choose to go with option for patient assistance first to see if he qualifies. Will call back later when samples are ready as well as for him to pick up forms.

## 2019-11-07 NOTE — Telephone Encounter (Signed)
Per Dr. Maretta Bees to give pt a month supply of both medications while we await pt's assistance forms. Called pt is aware both form has been placed up front and samples have been placed in fridge. He will come this afternoon.

## 2019-11-09 ENCOUNTER — Other Ambulatory Visit: Payer: Self-pay

## 2019-11-13 ENCOUNTER — Ambulatory Visit (INDEPENDENT_AMBULATORY_CARE_PROVIDER_SITE_OTHER): Payer: Medicare HMO | Admitting: Internal Medicine

## 2019-11-13 ENCOUNTER — Other Ambulatory Visit: Payer: Self-pay

## 2019-11-13 ENCOUNTER — Encounter: Payer: Self-pay | Admitting: Internal Medicine

## 2019-11-13 VITALS — BP 122/78 | HR 100 | Temp 98.0°F | Ht 69.0 in | Wt 173.2 lb

## 2019-11-13 DIAGNOSIS — Z794 Long term (current) use of insulin: Secondary | ICD-10-CM | POA: Diagnosis not present

## 2019-11-13 DIAGNOSIS — E1142 Type 2 diabetes mellitus with diabetic polyneuropathy: Secondary | ICD-10-CM

## 2019-11-13 LAB — POCT GLYCOSYLATED HEMOGLOBIN (HGB A1C): Hemoglobin A1C: 6.8 % — AB (ref 4.0–5.6)

## 2019-11-13 NOTE — Patient Instructions (Addendum)
-   Continue Basaglar 50 units  - Continue Novolog 25 units with each meal     HOW TO TREAT LOW BLOOD SUGARS (Blood sugar LESS THAN 70 MG/DL)  Please follow the RULE OF 15 for the treatment of hypoglycemia treatment (when your (blood sugars are less than 70 mg/dL)    STEP 1: Take 15 grams of carbohydrates when your blood sugar is low, which includes:   3-4 GLUCOSE TABS  OR  3-4 OZ OF JUICE OR REGULAR SODA OR  ONE TUBE OF GLUCOSE GEL     STEP 2: RECHECK blood sugar in 15 MINUTES STEP 3: If your blood sugar is still low at the 15 minute recheck --> then, go back to STEP 1 and treat AGAIN with another 15 grams of carbohydrates.

## 2019-11-13 NOTE — Progress Notes (Signed)
Name: Joseph Dixon  Age/ Sex: 68 y.o., male   MRN/ DOB: [id number], 1951/11/14     PCP: Elby Showers, MD   Reason for Endocrinology Evaluation: Type 2 Diabetes Mellitus  Initial Endocrine Consultative Visit: 11/29/2018    PATIENT IDENTIFIER: Joseph Dixon is a 68 y.o. male with a past medical history of DM, Oartic insufficiency and hyperlipidemia . The patient has followed with Endocrinology clinic since 11/29/2018 for consultative assistance with management of his diabetes.  DIABETIC HISTORY:  Joseph Dixon was diagnosed with T2DM ~ 2009, he does not recall any anti-glycemic agents he has been on, he has been on insulin for many years.  His hemoglobin A1c has ranged from 8.0% in 2019, peaking at 12.4% in 2016.   Pt with chronic intermittent Hx of diarrhea. That at times he attributes to Hx  ETOH intake.   SUBJECTIVE:   During the last visit (07/24/2019): We continued basaglar  50 units daily and increased  Novolog  25 units TIDQAC   Today (11/13/2019): Joseph Dixon is here for a 3 month follow up on his diabetes management.  He checks his blood sugars 3-4 times daily, preprandial and bedtime. The patient has not had hypoglycemic episodes since the last clinic visit.he claims that he stopped eating a snack  at bedtime but upon further questioning the patient does admit to drinking milk and sometimes having crackers at bedtime. He also has been active renovating his house.  Otherwise, the patient has not required any recent emergency interventions for hypoglycemia and has not had recent hospitalizations secondary to hyper or hypoglycemic episodes.    ROS: As per HPI and as detailed below: Review of Systems  Constitutional: Negative for fever and weight loss.  HENT: Negative for congestion and sore throat.   Respiratory: Negative for cough and shortness of breath.   Cardiovascular: Negative for chest pain and palpitations.      HOME DIABETES REGIMEN:  Basaglar 50 units daily   Novolog 25 units TID QAC   GLUCOSE LOG : BG range 59- > 245 mg/dL     DIABETIC COMPLICATIONS: Microvascular complications:   Neuropathy   Denies: retinopathy, nephropathy   Last eye exam: Completed 02/2018  Macrovascular complications:   Denies: CAD, PVD, CVA    HISTORY:  Past Medical History:  Past Medical History:  Diagnosis Date  . Cancer (HCC)    basal cell both shoulders  . Diabetes mellitus    insulin dependant  . Hyperlipidemia    Past Surgical History:  Past Surgical History:  Procedure Laterality Date  . repair of rt knee laceration      Social History:  reports that he has quit smoking. He has never used smokeless tobacco. He reports that he does not drink alcohol or use drugs. Family History:  Family History  Problem Relation Age of Onset  . Heart disease Father   . Diabetes Father   . Mental illness Father   . Alzheimer's disease Father   . COPD Brother   . Diabetes Mother   . Alzheimer's disease Mother   . Colon cancer Neg Hx      HOME MEDICATIONS: Allergies as of 11/13/2019      Reactions   Sulfa Antibiotics    hallucinations      Medication List       Accurate as of November 13, 2019  7:44 AM. If you have any questions, ask your nurse or doctor.  ALPRAZolam 0.25 MG tablet Commonly known as: XANAX TAKE 1 TABLET BY MOUTH EVERYDAY AT BEDTIME   aspirin EC 81 MG tablet Take 1 tablet (81 mg total) by mouth daily.   Basaglar KwikPen 100 UNIT/ML Sopn Inject 0.5 mLs (50 Units total) into the skin daily.   blood glucose meter kit and supplies Kit Dispense based on patient and insurance preference. ICD-10: E11.9   glucose blood test strip Use as instructed to check blood sugar before meals and at bedtime What changed:   how much to take  how to take this  when to take this   hydrochlorothiazide 25 MG tablet Commonly known as: HYDRODIURIL TAKE 1 TABLET BY MOUTH EVERY DAY   Insulin Pen Needle 32G X 4 MM Misc 1  pen by Does not apply route daily.   losartan 100 MG tablet Commonly known as: COZAAR TAKE 1 TABLET BY MOUTH EVERY DAY   metoCLOPramide 5 MG tablet Commonly known as: REGLAN TAKE 1 TABLET BY MOUTH 3 TIMES A DAY BEFORE MEALS   NovoLOG FlexPen 100 UNIT/ML FlexPen Generic drug: insulin aspart Inject 25 Units into the skin 3 (three) times daily with meals.   onetouch ultrasoft lancets Use as instructed   pantoprazole 40 MG tablet Commonly known as: PROTONIX TAKE 1 TABLET BY MOUTH EVERY DAY   rosuvastatin 20 MG tablet Commonly known as: CRESTOR TAKE 1 TABLET BY MOUTH EVERY DAY   Viagra 100 MG tablet Generic drug: sildenafil TAKE 1 TABLET 1 HOUR BEFORE INTERCOURSE       PHYSICAL EXAM: VS: BP 122/78 (BP Location: Left Arm, Patient Position: Sitting, Cuff Size: Normal)   Pulse 100   Temp 98 F (36.7 C)   Ht _0  (1.753 m)   Wt 173 lb 3.2 oz (78.6 kg)   SpO2 (!) 88%   BMI 25.58 kg/m    EXAM: General: Pt appears well and is in NAD  Lungs: Clear with good BS bilat with no rales, rhonchi, or wheezes  Heart: Auscultation: RRR with normal S1 and S2, no gallops or murmurs Carotid arteries: no bruits Periph. circulation: no peripheral edema  Abdomen: Normoactive bowel sounds, soft, nontender, without masses or organomegaly palpable  Lymphatics:   Extremities: Gait and station: normal gait  Digits and nails: no clubbing, cyanosis, petechiae, or nodes Head and neck: normal alignment and mobility BL UE: normal ROM and strength, no joint enlargement or tenderness BL LE: no pretibial edema normal ROM and strength, no joint enlargement or tenderness  Skin: Hair: texture and amount normal with gender appropriate distribution Skin Inspection: no rashes, acanthosis nigricans/skin tags. No lipohypertrophy. Skin Palpation: skin temperature, texture, and thickness normal to palpation  Neuro: Cranial nerves: II - XII grossly intact ;  Cerebellar: normal coordination and movement; no  tremor Motor: normal strength throughout DTRs: 2+ and symmetric in UE without delay in relaxation phase  Mental Status: Judgment, insight: intact Orientation: oriented to time, place, and person Memory: intact for recent and remote events Mood and affect: no depression, anxiety, or agitation      The skin of the feet is intact without sores or ulcerations. The pedal pulses are 2+ on right and 2+ on left. The sensation is intact to a screening 5.07, 10 gram monofilament bilaterally    DATA REVIEWED:  Lab Results  Component Value Date   HGBA1C 8.1 (H) 06/12/2019   HGBA1C 9.5 (H) 04/06/2019   HGBA1C 8.9 (A) 01/26/2019   Lab Results  Component Value Date   MICROALBUR 1.7  04/06/2019   LDLCALC 72 04/06/2019   CREATININE 0.88 04/06/2019   Lab Results  Component Value Date   MICRALBCREAT 24 04/06/2019     Lab Results  Component Value Date   CHOL 148 04/06/2019   HDL 47 04/06/2019   LDLCALC 72 04/06/2019   TRIG 196 (H) 04/06/2019   CHOLHDL 3.1 04/06/2019         ASSESSMENT / PLAN / RECOMMENDATIONS:   1) Type 2 Diabetes Mellitus, Optimally Controlled, With neuropathic complications - Most recent A1c of 6.8 %. Goal A1c < 7.0 %.   Plan:   - His A1c is has improved but he continues with variable glucose readings and hypoglycemia during the day. He attributes hypoglycemia  To working out in the yard, he was advised to eat a protein bad prior to physical work in the yard to prevent hypoglycemia.  - He continues to snack at bedtime , hence the fluctuating BG's in the mornings.  - He is not a candidate for GLP-1 agonists, DPP-4 inhibitors and Metformin due to severe  GI issues in the past . We discussed SGLT-2 inhibitors in the past but its not necessary at this time.  - He is in the donut hole, he has not  Filled pt assistance papers yet.   MEDICATIONS:  Continue Basaglar  50 units daily   Continue Novolog  25 units TID QAC   EDUCATION / INSTRUCTIONS:  BG  monitoring instructions: Patient is instructed to check his blood sugars 4 times a day, before meals and bedtime  Call [facility] if: BG persistently < 70 or > 300. . I reviewed the Rule of 15 for the treatment of hypoglycemia in detail with the patient. Literature supplied.  2) Diabetic complications:   Eye: Does not have known diabetic retinopathy. Last exam  05/2019  Neuro/ Feet: Does have known diabetic peripheral neuropathy.  Renal: Patient does have known baseline CKD. He is on an ACEI/ARB at present.       F/U in 3 months   Signed electronically by: Mack Guise, MD  [facility] Endocrinology  [facility] [address] [place], [place] [address] Phone: [phone] FAX: [phone]   CC: Elby Showers, MD [address] [address] Phone: [phone]  Fax: [phone]  Return to Endocrinology clinic as below: Future Appointments  Date Time Provider [place]  11/13/2019 10:50 AM , Melanie Crazier, MD [facility] None

## 2019-11-28 ENCOUNTER — Telehealth: Payer: Self-pay

## 2019-11-28 MED ORDER — HUMALOG KWIKPEN 200 UNIT/ML ~~LOC~~ SOPN: 25.0000 [IU] | PEN_INJECTOR | Freq: Three times a day (TID) | SUBCUTANEOUS | 3 refills | Status: DC

## 2019-11-28 NOTE — Telephone Encounter (Signed)
Looks like pt dropped off PAP forms so that Dr. Kelton Pillar can sign and fill out her section.Will place on her desk for signature.

## 2019-11-29 NOTE — Telephone Encounter (Signed)
Attempted to reach pt to inform him of the change in medication made on the PAP application, application faxed to [facility] and conformation received.

## 2019-12-04 NOTE — Telephone Encounter (Signed)
Received fax from [facility] stating that pt is approved for 12 month period

## 2020-01-03 ENCOUNTER — Telehealth: Payer: Self-pay

## 2020-01-03 NOTE — Telephone Encounter (Signed)
Fax received approving pt for 12 month period with [employer] pt assistance. PAE [phone]

## 2020-02-02 ENCOUNTER — Other Ambulatory Visit: Payer: Self-pay | Admitting: Internal Medicine

## 2020-02-02 NOTE — Telephone Encounter (Signed)
Was due for 6 month recheck in December. Please call to arrange. If Endocrinologst has done labs recently, he will not need lipid liver AIC B-met

## 2020-02-06 ENCOUNTER — Other Ambulatory Visit: Payer: Self-pay

## 2020-02-06 ENCOUNTER — Other Ambulatory Visit: Payer: Medicare HMO | Admitting: Internal Medicine

## 2020-02-06 DIAGNOSIS — Z794 Long term (current) use of insulin: Secondary | ICD-10-CM | POA: Diagnosis not present

## 2020-02-06 DIAGNOSIS — E781 Pure hyperglyceridemia: Secondary | ICD-10-CM | POA: Diagnosis not present

## 2020-02-06 DIAGNOSIS — E1142 Type 2 diabetes mellitus with diabetic polyneuropathy: Secondary | ICD-10-CM

## 2020-02-07 ENCOUNTER — Encounter: Payer: Self-pay | Admitting: Internal Medicine

## 2020-02-07 ENCOUNTER — Ambulatory Visit (INDEPENDENT_AMBULATORY_CARE_PROVIDER_SITE_OTHER): Payer: Medicare HMO | Admitting: Internal Medicine

## 2020-02-07 VITALS — Ht 69.0 in | Wt 173.0 lb

## 2020-02-07 DIAGNOSIS — E1143 Type 2 diabetes mellitus with diabetic autonomic (poly)neuropathy: Secondary | ICD-10-CM

## 2020-02-07 DIAGNOSIS — K3184 Gastroparesis: Secondary | ICD-10-CM

## 2020-02-07 DIAGNOSIS — E1169 Type 2 diabetes mellitus with other specified complication: Secondary | ICD-10-CM

## 2020-02-07 DIAGNOSIS — I10 Essential (primary) hypertension: Secondary | ICD-10-CM

## 2020-02-07 DIAGNOSIS — E785 Hyperlipidemia, unspecified: Secondary | ICD-10-CM | POA: Diagnosis not present

## 2020-02-07 DIAGNOSIS — R69 Illness, unspecified: Secondary | ICD-10-CM | POA: Diagnosis not present

## 2020-02-07 DIAGNOSIS — I351 Nonrheumatic aortic (valve) insufficiency: Secondary | ICD-10-CM | POA: Diagnosis not present

## 2020-02-07 DIAGNOSIS — I739 Peripheral vascular disease, unspecified: Secondary | ICD-10-CM | POA: Insufficient documentation

## 2020-02-07 DIAGNOSIS — M7740 Metatarsalgia, unspecified foot: Secondary | ICD-10-CM

## 2020-02-07 DIAGNOSIS — E1165 Type 2 diabetes mellitus with hyperglycemia: Secondary | ICD-10-CM | POA: Diagnosis not present

## 2020-02-07 DIAGNOSIS — F5102 Adjustment insomnia: Secondary | ICD-10-CM

## 2020-02-07 LAB — LIPID PANEL
Cholesterol: 152 mg/dL (ref <200)
HDL: 46 mg/dL (ref >=40)
LDL Cholesterol (Calc): 75 mg/dL (calc)
Non-HDL Cholesterol (Calc): 106 mg/dL (calc) (ref <130)
Total CHOL/HDL Ratio: 3.3 (calc) (ref <5.0)
Triglycerides: 211 mg/dL — ABNORMAL HIGH (ref <150)

## 2020-02-07 LAB — HEPATIC FUNCTION PANEL
AG Ratio: 2 (calc) (ref 1.0–2.5)
ALT: 24 U/L (ref 9–46)
AST: 24 U/L (ref 10–35)
Albumin: 4.4 g/dL (ref 3.6–5.1)
Alkaline phosphatase (APISO): 50 U/L (ref 35–144)
Bilirubin, Direct: 0.1 mg/dL (ref 0.0–0.2)
Globulin: 2.2 g/dL (calc) (ref 1.9–3.7)
Indirect Bilirubin: 0.4 mg/dL (calc) (ref 0.2–1.2)
Total Bilirubin: 0.5 mg/dL (ref 0.2–1.2)
Total Protein: 6.6 g/dL (ref 6.1–8.1)

## 2020-02-07 LAB — HEMOGLOBIN A1C
Hgb A1c MFr Bld: 8.3 % of total Hgb — ABNORMAL HIGH (ref <5.7)
Mean Plasma Glucose: 192 (calc)
eAG (mmol/L): 10.6 (calc)

## 2020-02-07 NOTE — Progress Notes (Signed)
Subjective:    Patient ID: Joseph Dixon, male    DOB: 1951-10-05, 69 y.o.   MRN: [id number]  HPI 69 year old Male with insulin-dependent diabetes mellitus, hyperlipidemia, hypertension seen today via interactive audio and video telecommunications due to Coronavirus pandemic and inclement weather.  He is agreeable to visit in this format today.  He is identified using 2 identifiers as Joseph Dixon, a patient in this practice.  Patient sees Dr. Kelton Pillar for diabetic management.  Patient denies noncompliance with regimen prescribed.  However, hemoglobin A1c has unfortunately increased from 6.8% 2 months ago to 8.3%.  10 months ago it was 9.5% in 8 months ago 8.1%.  Patient admits he is not exercising very much and probably has been eating more than he should.  Patient says that he has been having high morning glucose readings.  He does not understand that because he takes insulin before bedtime.  I am concerned he may be having decrease in his blood glucose around 2 AM and subsequent rebound hyperglycemia as a result.  I have suggested that he check his glucose at 2 AM to see if indeed he is hypoglycemic.  He says he goes to bed hungry.  Unfortunately he does not have a home blood pressure cuff so we cannot check his blood pressure today.  He says he weighs 173 pounds on his scale at home.  He says he was at the beach this summer and did a considerable amount of walking on the beach and subsequently developed pain along metatarsals of 1 foot.  Suspect he has metatarsalgia.  I think he would benefit from seeing podiatrist.  He may need insert in his shoe.  They certainly would be able to x-ray his foot.  He denies having any foot ulcers at this point in time.  Has had previous issues with toe ulcer treated by podiatrist.  He says this has healed.  He has a history of dyspepsia and abdominal pain that responded to Reglan and he remains on diet.  He takes Protonix for GE reflux.  He is on Crestor 20  mg daily.  Remains on HCTZ and losartan for hypertension.  History of anxiety for which we have given him a small quantity of Xanax 0.25 mg to take at bedtime for insomnia.    Review of Systems no other complaints     Objective:   Physical Exam Weight is described above.  He was unable to take blood pressure due to not having sphygmomanometer.  He is seen in no acute distress.       Assessment & Plan:  Diabetes mellitus insulin-dependent could be better controlled.  Suggest he increase exercise regimen by walking.  Needs to watch diet.  Has upcoming appointment with endocrinologist.  Essential hypertension-does not have home blood pressure monitor so could not advise him about this today.  Weight appears to be stable  I am concerned he may be having 2 AM hypoglycemia with rebound hyperglycemia.  Says he goes to bed hungry and wakes up with high glucose readings.  Suggested he check 2 AM glucose a couple of nights and see what is going on.  Anxiety and insomnia-takes Xanax at bedtime  Metatarsalgia-recommend podiatry consult.  This is been ongoing for several months since this past summer when he was walking on the beach.  Diabetic gastroparesis-continue Reglan  History of aortic valve insufficiency-followed by cardiology.  History of depression-does not seem to be particularly depressed today.  History of dysthymia   I  do not see an upcoming appointment with his endocrinologist.  He was advised to call the office and see when she would like to follow-up with him.  Suggest podiatry appointment for metatarsalgia.  Continue current medications.  Check 2 AM glucose a couple of nights and see if hypoglycemia is present at that time.  Health maintenance exam and Medicare wellness visit due after April 10, 2020.

## 2020-02-08 ENCOUNTER — Ambulatory Visit: Payer: Medicare HMO | Admitting: Internal Medicine

## 2020-02-08 NOTE — Patient Instructions (Signed)
Suggest podiatry appointment for metatarsalgia.  Work on exercise.  Check 2 AM glucose to see if value is low causing rebound hyperglycemia in the mornings.  Follow-up with endocrinologist.  Continue current medications.  Physical exam due here early summer.  Watch diet.

## 2020-03-07 ENCOUNTER — Other Ambulatory Visit: Payer: Self-pay | Admitting: Internal Medicine

## 2020-03-07 NOTE — Telephone Encounter (Signed)
Pended XANAX can you please sign it.

## 2020-03-07 NOTE — Telephone Encounter (Signed)
Refill x 6 months 

## 2020-03-08 ENCOUNTER — Telehealth: Payer: Self-pay | Admitting: Internal Medicine

## 2020-03-08 NOTE — Telephone Encounter (Signed)
Pharmacy called stating without it stating on the original RX for humalog that there are refills they are unable to fill it. Pharmacy requests a new RX sent with refills on it?  Pharm[facility] - [facility] Ph# [phone] Fax# [phone]

## 2020-03-08 NOTE — Telephone Encounter (Signed)
Patient needs refill for humalog - but he does not want it going to his pharmacy, he would like it sent by fax to ph# [phone].

## 2020-03-08 NOTE — Telephone Encounter (Signed)
Re-printed  previous paperwork so that you can add refills and initial and sign to be faxed to [facility]

## 2020-03-08 NOTE — Telephone Encounter (Signed)
Faxed and confirmation received.

## 2020-03-08 NOTE — Telephone Encounter (Signed)
Pt gets humalog from [facility]  so lft vm informing pt to contact [facility] for refills and informed that paperwork was sent 01/31/2020 and he was approved for the year so all refills will come from them.

## 2020-03-08 NOTE — Telephone Encounter (Signed)
done

## 2020-03-11 ENCOUNTER — Encounter: Payer: Self-pay | Admitting: Internal Medicine

## 2020-03-11 ENCOUNTER — Other Ambulatory Visit: Payer: Self-pay

## 2020-03-11 ENCOUNTER — Ambulatory Visit: Payer: Medicare HMO | Admitting: Internal Medicine

## 2020-03-11 VITALS — BP 124/60 | HR 76 | Temp 98.1°F | Ht 69.0 in | Wt 175.2 lb

## 2020-03-11 DIAGNOSIS — E1142 Type 2 diabetes mellitus with diabetic polyneuropathy: Secondary | ICD-10-CM

## 2020-03-11 DIAGNOSIS — Z794 Long term (current) use of insulin: Secondary | ICD-10-CM

## 2020-03-11 DIAGNOSIS — E1165 Type 2 diabetes mellitus with hyperglycemia: Secondary | ICD-10-CM | POA: Diagnosis not present

## 2020-03-11 MED ORDER — HUMALOG KWIKPEN 200 UNIT/ML ~~LOC~~ SOPN: PEN_INJECTOR | SUBCUTANEOUS | 3 refills | Status: DC

## 2020-03-11 MED ORDER — BASAGLAR KWIKPEN 100 UNIT/ML ~~LOC~~ SOPN: 55.0000 [IU] | PEN_INJECTOR | Freq: Every day | SUBCUTANEOUS | 3 refills | Status: DC

## 2020-03-11 NOTE — Patient Instructions (Addendum)
-   Increase Basaglar 55 units  - Continue Humalog 25 units with each meal  - Humalog with a bedtime snack 5 units    HOW TO TREAT LOW BLOOD SUGARS (Blood sugar LESS THAN 70 MG/DL)  Please follow the RULE OF 15 for the treatment of hypoglycemia treatment (when your (blood sugars are less than 70 mg/dL)    STEP 1: Take 15 grams of carbohydrates when your blood sugar is low, which includes:   3-4 GLUCOSE TABS  OR  3-4 OZ OF JUICE OR REGULAR SODA OR  ONE TUBE OF GLUCOSE GEL     STEP 2: RECHECK blood sugar in 15 MINUTES STEP 3: If your blood sugar is still low at the 15 minute recheck --> then, go back to STEP 1 and treat AGAIN with another 15 grams of carbohydrates.

## 2020-03-11 NOTE — Progress Notes (Signed)
Name: Joseph Dixon  Age/ Sex: 69 y.o., male   MRN/ DOB: [id number], April 04, 1951     PCP: Elby Showers, MD   Reason for Endocrinology Evaluation: Type 2 Diabetes Mellitus  Initial Endocrine Consultative Visit: 11/29/2018    PATIENT IDENTIFIER: Joseph Dixon is a 69 y.o. male with a past medical history of DM, Oartic insufficiency and hyperlipidemia . The patient has followed with Endocrinology Dixon since 11/29/2018 for consultative assistance with management of his diabetes.  DIABETIC HISTORY:  Joseph Dixon was diagnosed with T2DM ~ 2009, he does not recall any anti-glycemic agents he has been on, he has been on insulin for many years.  His hemoglobin A1c has ranged from 8.0% in 2019, peaking at 12.4% in 2016.   Pt with chronic intermittent Hx of diarrhea. That at times he attributes to Hx  ETOH intake.   SUBJECTIVE:   During the last visit (11/13/2019): A1c 6.8%. We continued MDI regimen.     Today (03/11/2020): Joseph Dixon is here for a 3 month follow up on his diabetes management.  He checks his blood sugars 3-4 times daily. The patient has not had hypoglycemic episodes since the last Dixon visit. He has been having occasional bedtime snacks such as peanut butter and crackers  Otherwise, the patient has not required any recent emergency interventions for hypoglycemia and has not had recent hospitalizations secondary to hyper or hypoglycemic episodes.    ROS: As per HPI and as detailed below: Review of Systems  Constitutional: Negative for fever and weight loss.  HENT: Negative for congestion and sore throat.   Respiratory: Negative for cough and shortness of breath.   Cardiovascular: Negative for chest pain and palpitations.      HOME DIABETES REGIMEN:  Basaglar 50 units daily  Humalog  25 units TID QAC   GLUCOSE LOG : BG range 80- 340 mg/dl     DIABETIC COMPLICATIONS: Microvascular complications:   Neuropathy   Denies: retinopathy, nephropathy   Last  eye exam: Completed 2020  Macrovascular complications:   Denies: CAD, PVD, CVA    HISTORY:  Past Medical History:  Past Medical History:  Diagnosis Date  . Cancer (HCC)    basal cell both shoulders  . Diabetes mellitus    insulin dependant  . Hyperlipidemia    Past Surgical History:  Past Surgical History:  Procedure Laterality Date  . repair of rt knee laceration      Social History:  reports that he has quit smoking. He has never used smokeless tobacco. He reports that he does not drink alcohol or use drugs. Family History:  Family History  Problem Relation Age of Onset  . Heart disease Father   . Diabetes Father   . Mental illness Father   . Alzheimer's disease Father   . COPD Brother   . Diabetes Mother   . Alzheimer's disease Mother   . Colon cancer Neg Hx      HOME MEDICATIONS: Allergies as of 03/11/2020      Reactions   Sulfa Antibiotics    hallucinations      Medication List       Accurate as of March 11, 2020  1:58 PM. If you have any questions, ask your nurse or doctor.        ALPRAZolam 0.25 MG tablet Commonly known as: XANAX TAKE 1 TABLET BY MOUTH EVERYDAY AT BEDTIME   aspirin EC 81 MG tablet Take 1 tablet (81 mg total) by mouth  daily.   Basaglar KwikPen 100 UNIT/ML Inject 0.5 mLs (50 Units total) into the skin daily.   blood glucose meter kit and supplies Kit Dispense based on patient and insurance preference. ICD-10: E11.9   glucose blood test strip Use as instructed to check blood sugar before meals and at bedtime What changed:   how much to take  how to take this  when to take this   HumaLOG KwikPen 200 UNIT/ML KwikPen Generic drug: insulin lispro Inject 25 Units into the skin 3 (three) times daily with meals.   hydrochlorothiazide 25 MG tablet Commonly known as: HYDRODIURIL TAKE 1 TABLET BY MOUTH EVERY DAY   Insulin Pen Needle 32G X 4 MM Misc 1 pen by Does not apply route daily.   losartan 100 MG tablet Commonly  known as: COZAAR TAKE 1 TABLET BY MOUTH EVERY DAY   metoCLOPramide 5 MG tablet Commonly known as: REGLAN TAKE 1 TABLET BY MOUTH 3 TIMES A DAY BEFORE MEALS   onetouch ultrasoft lancets Use as instructed   pantoprazole 40 MG tablet Commonly known as: PROTONIX TAKE 1 TABLET BY MOUTH EVERY DAY   rosuvastatin 20 MG tablet Commonly known as: CRESTOR TAKE 1 TABLET BY MOUTH EVERY DAY   Viagra 100 MG tablet Generic drug: sildenafil TAKE 1 TABLET 1 HOUR BEFORE INTERCOURSE       PHYSICAL EXAM: VS: BP 124/60 (BP Location: Left Arm, Patient Position: Sitting, Cuff Size: Normal)   Pulse 76   Temp 98.1 F (36.7 C)   Ht 5' 9"  (1.753 m)   Wt 175 lb 3.2 oz (79.5 kg)   SpO2 98%   BMI 25.87 kg/m    EXAM: General: Pt appears well and is in NAD  Lungs: Clear with good BS bilat with no rales, rhonchi, or wheezes  Heart: Auscultation: RRR with normal S1 and S2   Extremities:  BL LE: no pretibial edema   Mental Status: Judgment, insight: intact Orientation: oriented to time, place, and person Mood and affect: no depression, anxiety, or agitation     DM Foot Exam  03/11/2020  The skin of the feet is intact without sores or ulcerations. The pedal pulses are decreased on the right, undetectable on the left  The sensation is decreased to a screening 5.07, 10 gram monofilament bilaterally    DATA REVIEWED:  Lab Results  Component Value Date   HGBA1C 8.3 (H) 02/06/2020   HGBA1C 6.8 (A) 11/13/2019   HGBA1C 8.1 (H) 06/12/2019   Lab Results  Component Value Date   MICROALBUR 1.7 04/06/2019   LDLCALC 75 02/06/2020   CREATININE 0.88 04/06/2019   Lab Results  Component Value Date   MICRALBCREAT 24 04/06/2019     Lab Results  Component Value Date   CHOL 152 02/06/2020   HDL 46 02/06/2020   LDLCALC 75 02/06/2020   TRIG 211 (H) 02/06/2020   CHOLHDL 3.3 02/06/2020               ASSESSMENT / PLAN / RECOMMENDATIONS:   1) Type 2 Diabetes Mellitus, Poorly  Controlled,  With neuropathic complications - Most recent A1c of 8.3 %. Goal A1c < 7.0 %.   - A1c Up from 6.8%, we did not change his regimen last time , his weight has been stable, so this is most likely attributed dietary indiscretions, but the pt is not in agreement of this, he has been referred to our CDE in the past to discuss carb: insulin mismatch.  - I have advised him  in the past to take a prandial insulin with a bedtime snack which has not been done, but will give him another dose today, this is responsible for variable fasting BG 's 160-300's.  - His daytime glucose variability is due to activity level and insulin-carb mismatch, which has been a chronic issue.  - Pt on assistance program   MEDICATIONS:  Increase  Basaglar to 55 units daily   Continue Humalog  25 units TID QAC  Humalog 5 units with bedtime snack   EDUCATION / INSTRUCTIONS:  BG monitoring instructions: Patient is instructed to check his blood sugars 4 times a day, before meals and bedtime  Call Joseph Dixon if: BG persistently < 70 or > 300. . I reviewed the Rule of 15 for the treatment of hypoglycemia in detail with the patient. Literature supplied.  2) Diabetic complications:   Eye: Does not have known diabetic retinopathy. Last exam  05/2019  Neuro/ Feet: Does have known diabetic peripheral neuropathy.  Renal: Patient does have known baseline CKD. He is on an ACEI/ARB at present.       F/U in 4 months   Signed electronically by: Mack Guise, MD  [facility]  [facility] [address] [place], [place] [address] Phone: [phone] FAX: [phone]   CC: Elby Showers, MD [address] [address] Phone: [phone]  Fax: [phone]  Return to Endocrinology Dixon as below: No future appointments.

## 2020-05-01 ENCOUNTER — Other Ambulatory Visit: Payer: Self-pay | Admitting: Internal Medicine

## 2020-05-01 ENCOUNTER — Other Ambulatory Visit: Payer: Self-pay

## 2020-05-01 DIAGNOSIS — E1165 Type 2 diabetes mellitus with hyperglycemia: Secondary | ICD-10-CM

## 2020-05-01 DIAGNOSIS — Z794 Long term (current) use of insulin: Secondary | ICD-10-CM

## 2020-05-01 MED ORDER — BASAGLAR KWIKPEN 100 UNIT/ML ~~LOC~~ SOPN: 55.0000 [IU] | PEN_INJECTOR | Freq: Every day | SUBCUTANEOUS | 3 refills | Status: DC

## 2020-05-08 ENCOUNTER — Other Ambulatory Visit: Payer: Self-pay | Admitting: Internal Medicine

## 2020-05-28 ENCOUNTER — Encounter: Payer: Self-pay | Admitting: Internal Medicine

## 2020-05-28 DIAGNOSIS — E119 Type 2 diabetes mellitus without complications: Secondary | ICD-10-CM | POA: Diagnosis not present

## 2020-05-28 DIAGNOSIS — Z794 Long term (current) use of insulin: Secondary | ICD-10-CM | POA: Diagnosis not present

## 2020-05-28 DIAGNOSIS — H25013 Cortical age-related cataract, bilateral: Secondary | ICD-10-CM | POA: Diagnosis not present

## 2020-05-28 DIAGNOSIS — H5213 Myopia, bilateral: Secondary | ICD-10-CM | POA: Diagnosis not present

## 2020-05-28 DIAGNOSIS — H11153 Pinguecula, bilateral: Secondary | ICD-10-CM | POA: Diagnosis not present

## 2020-05-28 DIAGNOSIS — H35433 Paving stone degeneration of retina, bilateral: Secondary | ICD-10-CM | POA: Diagnosis not present

## 2020-05-28 DIAGNOSIS — H18413 Arcus senilis, bilateral: Secondary | ICD-10-CM | POA: Diagnosis not present

## 2020-05-28 DIAGNOSIS — Z79899 Other long term (current) drug therapy: Secondary | ICD-10-CM | POA: Diagnosis not present

## 2020-05-28 DIAGNOSIS — H2513 Age-related nuclear cataract, bilateral: Secondary | ICD-10-CM | POA: Diagnosis not present

## 2020-05-28 LAB — HM DIABETES EYE EXAM

## 2020-06-18 ENCOUNTER — Other Ambulatory Visit: Payer: Self-pay

## 2020-06-18 ENCOUNTER — Ambulatory Visit: Payer: Medicare HMO | Admitting: Cardiovascular Disease

## 2020-06-18 ENCOUNTER — Encounter: Payer: Self-pay | Admitting: Cardiovascular Disease

## 2020-06-18 VITALS — BP 135/62 | HR 62 | Temp 96.8°F | Ht 68.0 in | Wt 169.8 lb

## 2020-06-18 DIAGNOSIS — E785 Hyperlipidemia, unspecified: Secondary | ICD-10-CM

## 2020-06-18 DIAGNOSIS — I10 Essential (primary) hypertension: Secondary | ICD-10-CM | POA: Diagnosis not present

## 2020-06-18 DIAGNOSIS — I739 Peripheral vascular disease, unspecified: Secondary | ICD-10-CM | POA: Diagnosis not present

## 2020-06-18 NOTE — Progress Notes (Signed)
Cardiology Office Note   Date:  06/18/2020   ID:  Joseph Dixon, DOB [birth year], MRN [id number]  PCP:  Elby Showers, MD  Cardiologist:   Kathlyn Sacramento, MD   No chief complaint on file.     History of Present Illness: Joseph Dixon is a 69 y.o. male who is here today  for a follow-up visit regarding peripheral arterial disease.  The patient has known history of diabetes mellitus, hypertension and hyperlipidemia.   He is not a smoker and has no family history of premature coronary artery disease. The patient was seen in 2019 for a slow healing  small ulceration on the left big toe. He underwent noninvasive vascular evaluation which showed near normal ABI.  Duplex showed mainly tibial disease with an occluded anterior tibial artery on the right and an occluded anterior tibial and peroneal arteries on the left.  The ulceration healed without need for revascularization. He has been doing well with no recent chest pain, shortness of breath or palpitations.  No leg claudication.    Past Medical History:  Diagnosis Date  . Cancer (HCC)    basal cell both shoulders  . Diabetes mellitus    insulin dependant  . Hyperlipidemia     Past Surgical History:  Procedure Laterality Date  . repair of rt knee laceration       Current Outpatient Medications  Medication Sig Dispense Refill  . ALPRAZolam (XANAX) 0.25 MG tablet TAKE 1 TABLET BY MOUTH EVERYDAY AT BEDTIME 90 tablet 1  . aspirin EC 81 MG tablet Take 1 tablet (81 mg total) by mouth daily. 90 tablet 3  . blood glucose meter kit and supplies KIT Dispense based on patient and insurance preference. ICD-10: E11.9 1 each 0  . glucose blood test strip Use as instructed to check blood sugar before meals and at bedtime (Patient taking differently: 1 each by Other route 2 (two) times daily. Use as instructed to check blood sugar before meals and at bedtime) 200 each 12  . hydrochlorothiazide (HYDRODIURIL) 25 MG tablet TAKE 1 TABLET BY MOUTH  EVERY DAY 90 tablet 1  . Insulin Glargine (BASAGLAR KWIKPEN) 100 UNIT/ML Inject 0.55 mLs (55 Units total) into the skin daily. 15 pen 3  . insulin lispro (HUMALOG KWIKPEN) 200 UNIT/ML KwikPen Max daily 80 units 45 mL 3  . Insulin Pen Needle 32G X 4 MM MISC 1 pen by Does not apply route daily. 100 each 6  . Lancets (ONETOUCH ULTRASOFT) lancets Use as instructed 200 each 12  . losartan (COZAAR) 100 MG tablet TAKE 1 TABLET BY MOUTH EVERY DAY 90 tablet 1  . metoCLOPramide (REGLAN) 5 MG tablet TAKE 1 TABLET BY MOUTH 3 TIMES A DAY BEFORE MEALS 270 tablet 1  . pantoprazole (PROTONIX) 40 MG tablet TAKE 1 TABLET BY MOUTH EVERY DAY 90 tablet 3  . rosuvastatin (CRESTOR) 20 MG tablet TAKE 1 TABLET BY MOUTH EVERY DAY 90 tablet 1  . VIAGRA 100 MG tablet TAKE 1 TABLET 1 HOUR BEFORE INTERCOURSE 4 tablet 11   No current facility-administered medications for this visit.    Allergies:   Sulfa antibiotics    Social History:  The patient  reports that he has quit smoking. He has never used smokeless tobacco. He reports that he does not drink alcohol and does not use drugs.   Family History:  The patient's family history includes Alzheimer's disease in his father and mother; COPD in his brother; Diabetes in his father  and mother; Heart disease in his father; Mental illness in his father.    ROS:  Please see the history of present illness.   Otherwise, review of systems are positive for none.   All other systems are reviewed and negative.    PHYSICAL EXAM: VS:  BP 135/62   Pulse 62   Temp (!) 96.8 F (36 C)   Ht 5' 8"  (1.727 m)   Wt 169 lb 12.8 oz (77 kg)   SpO2 95%   BMI 25.82 kg/m  , BMI Body mass index is 25.82 kg/m. GEN: Well nourished, well developed, in no acute distress  HEENT: normal  Neck: no JVD, carotid bruits, or masses Cardiac: RRR; no murmurs, rubs, or gallops,no edema  Respiratory:  clear to auscultation bilaterally, normal work of breathing GI: soft, nontender, nondistended, +  BS MS: no deformity or atrophy  Skin: warm and dry, no rash Neuro:  Strength and sensation are intact Psych: euthymic mood, full affect Vascular: Femoral pulses normal bilaterally.  Posterior tibial is +1 bilaterally.  Dorsalis pedis is not palpable.   EKG:  EKG is ordered today. EKG showed normal sinus rhythm with no significant ST or T wave changes.   Recent Labs: 02/06/2020: ALT 24    Lipid Panel    Component Value Date/Time   CHOL 152 02/06/2020 0950   TRIG 211 (H) 02/06/2020 0950   HDL 46 02/06/2020 0950   CHOLHDL 3.3 02/06/2020 0950   VLDL 49 (H) 03/25/2017 1056   LDLCALC 75 02/06/2020 0950      Wt Readings from Last 3 Encounters:  06/18/20 169 lb 12.8 oz (77 kg)  03/11/20 175 lb 3.2 oz (79.5 kg)  02/07/20 173 lb (78.5 kg)       No flowsheet data found.    ASSESSMENT AND PLAN:  1.  Peripheral arterial disease: Previous ulceration on the left big toe healed completely.   The patient has no claudication.   He mainly has below the knee disease with an occluded anterior tibial and peroneal arteries on the left.  His posterior tibial artery seems to be close to normal given that he has a palpable pulse.  His ABI was also close to normal.  No indication for angiography or revascularization as currently has no claudication or evidence of critical limb ischemia.  Continue medical therapy.  I discussed the importance of proper foot hygiene.  2.  Essential hypertension: Blood pressure is reasonably controlled.  3.  Hyperlipidemia: Currently on rosuvastatin 20 mg daily.  Most recent lipid profile showed an LDL of 75.  4.  Diabetes mellitus: Still not optimally controlled as most recent hemoglobin A1c was 8.3.    Disposition:   FU with me in 12 months  Signed,  Kathlyn Sacramento, MD  06/18/2020 10:27 AM    [place]

## 2020-06-18 NOTE — Patient Instructions (Signed)

## 2020-07-01 DIAGNOSIS — D225 Melanocytic nevi of trunk: Secondary | ICD-10-CM | POA: Diagnosis not present

## 2020-07-01 DIAGNOSIS — Z85828 Personal history of other malignant neoplasm of skin: Secondary | ICD-10-CM | POA: Diagnosis not present

## 2020-07-01 DIAGNOSIS — L821 Other seborrheic keratosis: Secondary | ICD-10-CM | POA: Diagnosis not present

## 2020-07-01 DIAGNOSIS — L570 Actinic keratosis: Secondary | ICD-10-CM | POA: Diagnosis not present

## 2020-07-17 ENCOUNTER — Ambulatory Visit: Payer: Medicare HMO | Admitting: Internal Medicine

## 2020-07-17 NOTE — Progress Notes (Signed)
Name: Joseph Dixon  Age/ Sex: 69 y.o., male   MRN/ DOB: [id number], [birth year]     PCP: Elby Showers, MD   Reason for Endocrinology Evaluation: Type 2 Diabetes Mellitus  Initial Endocrine Consultative Visit: 11/29/2018    PATIENT IDENTIFIER: [name] is a 69 y.o. male with a past medical history of DM, Oartic insufficiency and hyperlipidemia . The patient has followed with Endocrinology clinic since 11/29/2018 for consultative assistance with management of his diabetes.  DIABETIC HISTORY:  Mr. [name] was diagnosed with T2DM ~ 2009, he does not recall any anti-glycemic agents he has been on, he has been on insulin for many years.  His hemoglobin A1c has ranged from 8.0% in 2019, peaking at 12.4% in 2016.   Pt with chronic intermittent Hx of diarrhea. That at times he attributes to Hx  ETOH intake.   SUBJECTIVE:   During the last visit (03/11/2020): A1c 8.3 %. We adjusted MDI regimen.     Today (07/18/2020): [name] is here for a 3 month follow up on his diabetes management.  He checks his blood sugars 3 times daily. The patient has had hypoglycemic episodes since the last clinic visit. He has been having occasional bedtime snacks.      HOME DIABETES REGIMEN:  Basaglar 55 units daily  Humalog  25 units TID QAC Humalog 5 units with bedtime snack    GLUCOSE LOG: Fasting  134- 305 mg/dL    DIABETIC COMPLICATIONS: Microvascular complications:   Neuropathy   Denies: retinopathy, nephropathy   Last eye exam: Completed 2020  Macrovascular complications:   Denies: CAD, PVD, CVA    HISTORY:  Past Medical History:  Past Medical History:  Diagnosis Date  . Cancer (HCC)    basal cell both shoulders  . Diabetes mellitus    insulin dependant  . Hyperlipidemia    Past Surgical History:  Past Surgical History:  Procedure Laterality Date  . repair of rt knee laceration      Social History:  reports that he has quit smoking. He has never used  smokeless tobacco. He reports that he does not drink alcohol and does not use drugs. Family History:  Family History  Problem Relation Age of Onset  . Heart disease Father   . Diabetes Father   . Mental illness Father   . Alzheimer's disease Father   . COPD Brother   . Diabetes Mother   . Alzheimer's disease Mother   . Colon cancer Neg Hx      HOME MEDICATIONS: Allergies as of 07/18/2020      Reactions   Sulfa Antibiotics    hallucinations      Medication List       Accurate as of July 18, 2020 10:00 AM. If you have any questions, ask your nurse or doctor.        ALPRAZolam 0.25 MG tablet Commonly known as: XANAX TAKE 1 TABLET BY MOUTH EVERYDAY AT BEDTIME   aspirin EC 81 MG tablet Take 1 tablet (81 mg total) by mouth daily.   Basaglar KwikPen 100 UNIT/ML Inject 0.55 mLs (55 Units total) into the skin daily.   blood glucose meter kit and supplies Kit Dispense based on patient and insurance preference. ICD-10: E11.9   glucose blood test strip Use as instructed to check blood sugar before meals and at bedtime What changed:   how much to take  how to take this  when to take this   HumaLOG  KwikPen 200 UNIT/ML KwikPen Generic drug: insulin lispro Max daily 90 units What changed: additional instructions Changed by: Dorita Sciara, MD   hydrochlorothiazide 25 MG tablet Commonly known as: HYDRODIURIL TAKE 1 TABLET BY MOUTH EVERY DAY   Insulin Pen Needle 32G X 4 MM Misc 1 pen by Does not apply route daily.   losartan 100 MG tablet Commonly known as: COZAAR TAKE 1 TABLET BY MOUTH EVERY DAY   metoCLOPramide 5 MG tablet Commonly known as: REGLAN TAKE 1 TABLET BY MOUTH 3 TIMES A DAY BEFORE MEALS   onetouch ultrasoft lancets Use as instructed   pantoprazole 40 MG tablet Commonly known as: PROTONIX TAKE 1 TABLET BY MOUTH EVERY DAY   rosuvastatin 20 MG tablet Commonly known as: CRESTOR TAKE 1 TABLET BY MOUTH EVERY DAY   Viagra 100 MG  tablet Generic drug: sildenafil TAKE 1 TABLET 1 HOUR BEFORE INTERCOURSE       PHYSICAL EXAM: VS: BP (!) 118/58 (BP Location: Left Arm, Patient Position: Sitting, Cuff Size: Normal)   Pulse 58   Ht 5' 8"  (1.727 m)   Wt 166 lb 6.4 oz (75.5 kg)   SpO2 98%   BMI 25.30 kg/m    EXAM: General: Pt appears well and is in NAD  Lungs: Clear with good BS bilat with no rales, rhonchi, or wheezes  Heart: Auscultation: RRR with normal S1 and S2   Extremities:  BL LE: no pretibial edema   Mental Status: Judgment, insight: intact Orientation: oriented to time, place, and person Mood and affect: no depression, anxiety, or agitation     DM Foot Exam  07/18/2020  The skin of the feet is intact without sores or ulcerations. The pedal pulses are decreased bilaterally  The sensation is decreased to a screening 5.07, 10 gram monofilament bilaterally    DATA REVIEWED:  Lab Results  Component Value Date   HGBA1C 7.2 (A) 07/18/2020   HGBA1C 8.3 (H) 02/06/2020   HGBA1C 6.8 (A) 11/13/2019   Lab Results  Component Value Date   MICROALBUR 1.7 04/06/2019   LDLCALC 75 02/06/2020   CREATININE 0.88 04/06/2019   Lab Results  Component Value Date   MICRALBCREAT 24 04/06/2019     Lab Results  Component Value Date   CHOL 152 02/06/2020   HDL 46 02/06/2020   LDLCALC 75 02/06/2020   TRIG 211 (H) 02/06/2020   CHOLHDL 3.3 02/06/2020      ASSESSMENT / PLAN / RECOMMENDATIONS:   1) Type 2 Diabetes Mellitus, Poorly  Controlled, With neuropathic complications - Most recent A1c of 7.2 %. Goal A1c < 7.0 %.   - A1c down from 8.5 %  - His daytime glucose variability is due to activity level and insulin-carb mismatch, which has been a chronic issue, despite meeting our RD  - Pt on assistance program  - Declines CGM  - Will make the following change   MEDICATIONS:  Continue Basaglar  55 units daily   Continue Humalog  25 units with Breakfast and Lunch   Increase Humalog to 30 units with  supper   EDUCATION / INSTRUCTIONS:  BG monitoring instructions: Patient is instructed to check his blood sugars 4 times a day, before meals and bedtime  Call [place] [facility] if: BG persistently < 70  . I reviewed the Rule of 15 for the treatment of hypoglycemia in detail with the patient. Literature supplied.  2) Diabetic complications:   Eye: Does not have known diabetic retinopathy. Last exam  05/2019  Neuro/ Feet: Does have known diabetic peripheral neuropathy.  Renal: Patient does have known baseline CKD. He is on an ACEI/ARB at present.       F/U in 4 months   Signed electronically by: Mack Guise, MD  [facility]  [facility] [address] [place], [place] [address] Phone: [phone] FAX: [phone]   CC: Elby Showers, MD [address] [place] [address] Phone: [phone]  Fax: [phone]  Return to Endocrinology clinic as below: Future Appointments  Date Time Provider [place]  07/25/2020  9:30 AM Elby Showers, MD [facility]-M[facility] MJB  07/29/2020  2:00 PM Baxley, Cresenciano Lick, MD [facility] MJB

## 2020-07-18 ENCOUNTER — Other Ambulatory Visit: Payer: Self-pay

## 2020-07-18 ENCOUNTER — Ambulatory Visit: Payer: Medicare HMO | Admitting: Internal Medicine

## 2020-07-18 ENCOUNTER — Encounter: Payer: Self-pay | Admitting: Internal Medicine

## 2020-07-18 VITALS — BP 118/58 | HR 58 | Ht 68.0 in | Wt 166.4 lb

## 2020-07-18 DIAGNOSIS — E1165 Type 2 diabetes mellitus with hyperglycemia: Secondary | ICD-10-CM

## 2020-07-18 DIAGNOSIS — Z794 Long term (current) use of insulin: Secondary | ICD-10-CM | POA: Diagnosis not present

## 2020-07-18 DIAGNOSIS — E1142 Type 2 diabetes mellitus with diabetic polyneuropathy: Secondary | ICD-10-CM | POA: Diagnosis not present

## 2020-07-18 LAB — POCT GLYCOSYLATED HEMOGLOBIN (HGB A1C): Hemoglobin A1C: 7.2 % — AB (ref 4.0–5.6)

## 2020-07-18 MED ORDER — HUMALOG KWIKPEN 200 UNIT/ML ~~LOC~~ SOPN: PEN_INJECTOR | SUBCUTANEOUS | 3 refills | Status: AC

## 2020-07-18 NOTE — Patient Instructions (Addendum)
-   Continue Basaglar 55 units  - Continue Humalog 25 units with Breakfast and Lunch  - Increase Humalog to 30 units with Supper     HOW TO TREAT LOW BLOOD SUGARS (Blood sugar LESS THAN 70 MG/DL)  Please follow the RULE OF 15 for the treatment of hypoglycemia treatment (when your (blood sugars are less than 70 mg/dL)    STEP 1: Take 15 grams of carbohydrates when your blood sugar is low, which includes:   3-4 GLUCOSE TABS  OR  3-4 OZ OF JUICE OR REGULAR SODA OR  ONE TUBE OF GLUCOSE GEL     STEP 2: RECHECK blood sugar in 15 MINUTES STEP 3: If your blood sugar is still low at the 15 minute recheck --> then, go back to STEP 1 and treat AGAIN with another 15 grams of carbohydrates.

## 2020-07-25 ENCOUNTER — Other Ambulatory Visit: Payer: Medicare HMO | Admitting: Internal Medicine

## 2020-07-25 ENCOUNTER — Other Ambulatory Visit: Payer: Self-pay

## 2020-07-25 DIAGNOSIS — E1169 Type 2 diabetes mellitus with other specified complication: Secondary | ICD-10-CM

## 2020-07-25 DIAGNOSIS — E781 Pure hyperglyceridemia: Secondary | ICD-10-CM | POA: Diagnosis not present

## 2020-07-25 DIAGNOSIS — Z0000 Encounter for general adult medical examination without abnormal findings: Secondary | ICD-10-CM | POA: Diagnosis not present

## 2020-07-25 DIAGNOSIS — K3184 Gastroparesis: Secondary | ICD-10-CM | POA: Diagnosis not present

## 2020-07-25 DIAGNOSIS — I351 Nonrheumatic aortic (valve) insufficiency: Secondary | ICD-10-CM | POA: Diagnosis not present

## 2020-07-25 DIAGNOSIS — I10 Essential (primary) hypertension: Secondary | ICD-10-CM | POA: Diagnosis not present

## 2020-07-25 DIAGNOSIS — E119 Type 2 diabetes mellitus without complications: Secondary | ICD-10-CM | POA: Diagnosis not present

## 2020-07-25 DIAGNOSIS — E1143 Type 2 diabetes mellitus with diabetic autonomic (poly)neuropathy: Secondary | ICD-10-CM

## 2020-07-25 DIAGNOSIS — I739 Peripheral vascular disease, unspecified: Secondary | ICD-10-CM | POA: Diagnosis not present

## 2020-07-25 DIAGNOSIS — E1165 Type 2 diabetes mellitus with hyperglycemia: Secondary | ICD-10-CM

## 2020-07-25 DIAGNOSIS — E785 Hyperlipidemia, unspecified: Secondary | ICD-10-CM

## 2020-07-25 DIAGNOSIS — M7740 Metatarsalgia, unspecified foot: Secondary | ICD-10-CM

## 2020-07-25 DIAGNOSIS — R030 Elevated blood-pressure reading, without diagnosis of hypertension: Secondary | ICD-10-CM | POA: Diagnosis not present

## 2020-07-25 LAB — CBC WITH DIFFERENTIAL/PLATELET
Absolute Monocytes: 646 cells/uL (ref 200–950)
Basophils Absolute: 102 cells/uL (ref 0–200)
Basophils Relative: 1.2 %
Eosinophils Absolute: 332 cells/uL (ref 15–500)
Eosinophils Relative: 3.9 %
HCT: 45.7 % (ref 38.5–50.0)
Hemoglobin: 15.3 g/dL (ref 13.2–17.1)
Lymphs Abs: 1913 cells/uL (ref 850–3900)
MCH: 29.9 pg (ref 27.0–33.0)
MCHC: 33.5 g/dL (ref 32.0–36.0)
MCV: 89.4 fL (ref 80.0–100.0)
MPV: 11.9 fL (ref 7.5–12.5)
Monocytes Relative: 7.6 %
Neutro Abs: 5508 cells/uL (ref 1500–7800)
Neutrophils Relative %: 64.8 %
Platelets: 233 10*3/uL (ref 140–400)
RBC: 5.11 10*6/uL (ref 4.20–5.80)
RDW: 13.2 % (ref 11.0–15.0)
Total Lymphocyte: 22.5 %
WBC: 8.5 10*3/uL (ref 3.8–10.8)

## 2020-07-25 LAB — COMPLETE METABOLIC PANEL WITH GFR
AG Ratio: 1.8 (calc) (ref 1.0–2.5)
ALT: 22 U/L (ref 9–46)
AST: 21 U/L (ref 10–35)
Albumin: 4.4 g/dL (ref 3.6–5.1)
Alkaline phosphatase (APISO): 54 U/L (ref 35–144)
BUN: 20 mg/dL (ref 7–25)
CO2: 28 mmol/L (ref 20–32)
Calcium: 9.8 mg/dL (ref 8.6–10.3)
Chloride: 101 mmol/L (ref 98–110)
Creat: 0.98 mg/dL (ref 0.70–1.25)
GFR, Est African American: 92 mL/min/{1.73_m2} (ref >=60)
GFR, Est Non African American: 79 mL/min/{1.73_m2} (ref >=60)
Globulin: 2.4 g/dL (calc) (ref 1.9–3.7)
Glucose, Bld: 178 mg/dL — ABNORMAL HIGH (ref 65–99)
Potassium: 4.7 mmol/L (ref 3.5–5.3)
Sodium: 139 mmol/L (ref 135–146)
Total Bilirubin: 0.6 mg/dL (ref 0.2–1.2)
Total Protein: 6.8 g/dL (ref 6.1–8.1)

## 2020-07-25 LAB — LIPID PANEL
Cholesterol: 130 mg/dL (ref <200)
HDL: 52 mg/dL (ref >=40)
LDL Cholesterol (Calc): 60 mg/dL (calc)
Non-HDL Cholesterol (Calc): 78 mg/dL (calc) (ref <130)
Total CHOL/HDL Ratio: 2.5 (calc) (ref <5.0)
Triglycerides: 96 mg/dL (ref <150)

## 2020-07-25 LAB — PSA: PSA: 0.3 ng/mL (ref <=4.0)

## 2020-07-29 ENCOUNTER — Ambulatory Visit (INDEPENDENT_AMBULATORY_CARE_PROVIDER_SITE_OTHER): Payer: Medicare HMO | Admitting: Internal Medicine

## 2020-07-29 ENCOUNTER — Other Ambulatory Visit: Payer: Self-pay

## 2020-07-29 ENCOUNTER — Encounter: Payer: Self-pay | Admitting: Internal Medicine

## 2020-07-29 VITALS — BP 150/60 | HR 86 | Ht 68.0 in | Wt 165.0 lb

## 2020-07-29 DIAGNOSIS — F5102 Adjustment insomnia: Secondary | ICD-10-CM | POA: Diagnosis not present

## 2020-07-29 DIAGNOSIS — E785 Hyperlipidemia, unspecified: Secondary | ICD-10-CM

## 2020-07-29 DIAGNOSIS — E1169 Type 2 diabetes mellitus with other specified complication: Secondary | ICD-10-CM

## 2020-07-29 DIAGNOSIS — I351 Nonrheumatic aortic (valve) insufficiency: Secondary | ICD-10-CM | POA: Diagnosis not present

## 2020-07-29 DIAGNOSIS — E1143 Type 2 diabetes mellitus with diabetic autonomic (poly)neuropathy: Secondary | ICD-10-CM | POA: Diagnosis not present

## 2020-07-29 DIAGNOSIS — R69 Illness, unspecified: Secondary | ICD-10-CM | POA: Diagnosis not present

## 2020-07-29 DIAGNOSIS — K3184 Gastroparesis: Secondary | ICD-10-CM

## 2020-07-29 DIAGNOSIS — R030 Elevated blood-pressure reading, without diagnosis of hypertension: Secondary | ICD-10-CM | POA: Diagnosis not present

## 2020-07-29 DIAGNOSIS — I10 Essential (primary) hypertension: Secondary | ICD-10-CM

## 2020-07-29 DIAGNOSIS — Z0000 Encounter for general adult medical examination without abnormal findings: Secondary | ICD-10-CM | POA: Diagnosis not present

## 2020-07-29 LAB — POCT URINALYSIS DIPSTICK
Appearance: NEGATIVE
Bilirubin, UA: NEGATIVE
Blood, UA: NEGATIVE
Glucose, UA: POSITIVE — AB
Ketones, UA: NEGATIVE
Leukocytes, UA: NEGATIVE
Nitrite, UA: NEGATIVE
Odor: NEGATIVE
Protein, UA: NEGATIVE
Spec Grav, UA: 1.015 (ref 1.010–1.025)
Urobilinogen, UA: 0.2 E.U./dL
pH, UA: 6.5 (ref 5.0–8.0)

## 2020-07-29 NOTE — Patient Instructions (Addendum)
Start taking BP meds in am instead of at night. RTC in 2 weeks for BP check.  Continue current medications.  Continue follow-up with endocrinology.
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
## 2020-07-30 ENCOUNTER — Other Ambulatory Visit: Payer: Self-pay | Admitting: Internal Medicine

## 2020-08-13 ENCOUNTER — Other Ambulatory Visit: Payer: Self-pay

## 2020-08-13 ENCOUNTER — Ambulatory Visit (INDEPENDENT_AMBULATORY_CARE_PROVIDER_SITE_OTHER): Payer: Medicare HMO | Admitting: Internal Medicine

## 2020-08-13 ENCOUNTER — Encounter: Payer: Self-pay | Admitting: Internal Medicine

## 2020-08-13 VITALS — BP 110/60 | HR 70 | Ht 68.0 in | Wt 165.0 lb

## 2020-08-13 DIAGNOSIS — I10 Essential (primary) hypertension: Secondary | ICD-10-CM | POA: Diagnosis not present

## 2020-08-13 NOTE — Progress Notes (Signed)
   Subjective:    Patient ID: Joseph Dixon, male    DOB: [birth year], 69 y.o.   MRN: [id number]  HPI 69 year old Male seen for follow up of elevated BP reading on recent CPE.  He denies noncompliance with his antihypertensive medication.  He feels well and has no complaints.  He is on losartan 100 mg daily and HCTZ 25 mg daily.  He is an insulin-dependent diabetic.  Review of Systems     Objective:   Physical Exam  Blood pressure today is 110/60 pulse 70 pulse oximetry 98%  Skin warm and dry.  No carotid bruits.  Chest clear to auscultation.  Cardiac exam regular rate and rhythm normal S1 and S2 without murmurs or gallops.      Assessment & Plan:  Elevated blood pressure reading at last visit on August 9 at 150/60.  Blood pressure reading is now normal.  Denies noncompliance with medication.  Plan: He does not have home blood pressure monitor.  He should consider getting one or getting blood pressure checked at pharmacy on a regular basis.  Plan to follow-up with him in February 2022.

## 2020-08-18 NOTE — Patient Instructions (Signed)
Consider purchasing a home blood pressure monitor or getting blood pressure checked regularly at pharmacy.  Continue to monitor this on your own and call if elevated.  Otherwise follow-up here in 6 months.

## 2020-09-03 ENCOUNTER — Other Ambulatory Visit: Payer: Self-pay | Admitting: Internal Medicine

## 2020-09-15 DIAGNOSIS — R69 Illness, unspecified: Secondary | ICD-10-CM | POA: Diagnosis not present

## 2020-09-17 ENCOUNTER — Other Ambulatory Visit: Payer: Self-pay | Admitting: Internal Medicine

## 2020-10-03 ENCOUNTER — Other Ambulatory Visit: Payer: Self-pay | Admitting: Internal Medicine

## 2020-10-08 ENCOUNTER — Other Ambulatory Visit: Payer: Self-pay | Admitting: Internal Medicine

## 2020-11-21 ENCOUNTER — Ambulatory Visit: Payer: Medicare HMO | Admitting: Internal Medicine

## 2020-11-21 ENCOUNTER — Other Ambulatory Visit: Payer: Self-pay

## 2020-11-21 ENCOUNTER — Encounter: Payer: Self-pay | Admitting: Internal Medicine

## 2020-11-21 VITALS — BP 122/70 | HR 68 | Ht 68.0 in | Wt 167.5 lb

## 2020-11-21 DIAGNOSIS — Z794 Long term (current) use of insulin: Secondary | ICD-10-CM | POA: Diagnosis not present

## 2020-11-21 DIAGNOSIS — E1142 Type 2 diabetes mellitus with diabetic polyneuropathy: Secondary | ICD-10-CM

## 2020-11-21 LAB — POCT GLYCOSYLATED HEMOGLOBIN (HGB A1C): Hemoglobin A1C: 6.9 % — AB (ref 4.0–5.6)

## 2020-11-21 NOTE — Patient Instructions (Addendum)
-   Increase Basaglar 55 units  - Continue Humalog 25 units with Breakfast and Lunch  - Continue Humalog to 30 units with Supper     HOW TO TREAT LOW BLOOD SUGARS (Blood sugar LESS THAN 70 MG/DL)  Please follow the RULE OF 15 for the treatment of hypoglycemia treatment (when your (blood sugars are less than 70 mg/dL)    STEP 1: Take 15 grams of carbohydrates when your blood sugar is low, which includes:   3-4 GLUCOSE TABS  OR  3-4 OZ OF JUICE OR REGULAR SODA OR  ONE TUBE OF GLUCOSE GEL     STEP 2: RECHECK blood sugar in 15 MINUTES STEP 3: If your blood sugar is still low at the 15 minute recheck --> then, go back to STEP 1 and treat AGAIN with another 15 grams of carbohydrates.

## 2020-11-21 NOTE — Progress Notes (Signed)
Name: Joseph Dixon  Age/ Sex: 69 y.o., male   MRN/ DOB: [id number], [birth year]     PCP: Elby Showers, MD   Reason for Endocrinology Evaluation: Type 2 Diabetes Mellitus  Initial Endocrine Consultative Visit: 11/29/2018    PATIENT IDENTIFIER: [name] is a 69 y.o. male with a past medical history of DM, Oartic insufficiency and hyperlipidemia . The patient has followed with Endocrinology clinic since 11/29/2018 for consultative assistance with management of his diabetes.  DIABETIC HISTORY:  [name] was diagnosed with T2DM ~ 2009, he does not recall any anti-glycemic agents he has been on, he has been on insulin for many years.  His hemoglobin A1c has ranged from 8.0% in 2019, peaking at 12.4% in 2016.   Pt with chronic intermittent Hx of diarrhea. That at times he attributes to Hx  ETOH intake.   SUBJECTIVE:   During the last visit (07/18/2020): A1c 7.2 %. We adjusted MDI regimen.     Today (11/21/2020): [name] is here for a 4 month follow up on his diabetes management.  He checks his blood sugars 3 times daily. The patient has had hypoglycemic episodes since the last clinic visit. He has been having occasional bedtime snacks.    Denies sob, nausea or diarrhea     HOME DIABETES REGIMEN:  Basaglar 55 units daily - taking 50 units daily  Humalog  25 units with breakfast and Lunch and 30 units with Supper       GLUCOSE LOG: Fasting  51- 391  mg/dL    DIABETIC COMPLICATIONS: Microvascular complications:   Neuropathy   Denies: retinopathy, nephropathy   Last eye exam: Completed 05/2019  Macrovascular complications:   Denies: CAD, PVD, CVA    HISTORY:  Past Medical History:  Past Medical History:  Diagnosis Date  . Cancer (HCC)    basal cell both shoulders  . Diabetes mellitus    insulin dependant  . Hyperlipidemia    Past Surgical History:  Past Surgical History:  Procedure Laterality Date  . repair of rt knee laceration       Social History:  reports that he has quit smoking. He has never used smokeless tobacco. He reports that he does not drink alcohol and does not use drugs. Family History:  Family History  Problem Relation Age of Onset  . Heart disease Father   . Diabetes Father   . Mental illness Father   . Alzheimer's disease Father   . COPD Brother   . Diabetes Mother   . Alzheimer's disease Mother   . Colon cancer Neg Hx      HOME MEDICATIONS: Allergies as of 11/21/2020      Reactions   Sulfa Antibiotics    hallucinations      Medication List       Accurate as of November 21, 2020  9:40 AM. If you have any questions, ask your nurse or doctor.        ALPRAZolam 0.25 MG tablet Commonly known as: XANAX TAKE 1 TABLET BY MOUTH EVERYDAY AT BEDTIME   aspirin EC 81 MG tablet Take 1 tablet (81 mg total) by mouth daily.   Basaglar KwikPen 100 UNIT/ML Inject 0.55 mLs (55 Units total) into the skin daily.   BD Pen Needle Nano U/F 32G X 4 MM Misc Generic drug: Insulin Pen Needle USE 1 PEN NEEDLE DAILY   blood glucose meter kit and supplies Kit Dispense based on patient and insurance preference. ICD-10:  E11.9   glucose blood test strip Use as instructed to check blood sugar before meals and at bedtime What changed:   how much to take  how to take this  when to take this   HumaLOG KwikPen 200 UNIT/ML KwikPen Generic drug: insulin lispro Max daily 90 units   hydrochlorothiazide 25 MG tablet Commonly known as: HYDRODIURIL TAKE 1 TABLET BY MOUTH EVERY DAY. NEED OFFICE VISIT   losartan 100 MG tablet Commonly known as: COZAAR TAKE 1 TABLET BY MOUTH EVERY DAY   metoCLOPramide 5 MG tablet Commonly known as: REGLAN TAKE 1 TABLET BY MOUTH 3 TIMES A DAY BEFORE MEALS   onetouch ultrasoft lancets Use as instructed   pantoprazole 40 MG tablet Commonly known as: PROTONIX TAKE 1 TABLET BY MOUTH EVERY DAY   rosuvastatin 20 MG tablet Commonly known as: CRESTOR TAKE 1 TABLET BY  MOUTH EVERY DAY   Viagra 100 MG tablet Generic drug: sildenafil TAKE 1 TABLET 1 HOUR BEFORE INTERCOURSE       PHYSICAL EXAM: VS: BP 122/70   Pulse 68   Ht _0  (1.727 m)   Wt 167 lb 8 oz (76 kg)   SpO2 98%   BMI 25.47 kg/m    EXAM: General: Pt appears well and is in NAD  Lungs: Clear with good BS bilat with no rales, rhonchi, or wheezes  Heart: Auscultation: RRR with normal S1 and S2   Extremities:  BL LE: no pretibial edema   Mental Status: Judgment, insight: intact Orientation: oriented to time, place, and person Mood and affect: no depression, anxiety, or agitation     DM Foot Exam  07/18/2020  The skin of the feet is intact without sores or ulcerations. The pedal pulses are decreased bilaterally  The sensation is decreased to a screening 5.07, 10 gram monofilament bilaterally    DATA REVIEWED:  Lab Results  Component Value Date   HGBA1C 6.9 (A) 11/21/2020   HGBA1C 7.2 (A) 07/18/2020   HGBA1C 8.3 (H) 02/06/2020   Lab Results  Component Value Date   MICROALBUR 1.7 04/06/2019   LDLCALC 60 07/25/2020   CREATININE 0.98 07/25/2020   Lab Results  Component Value Date   MICRALBCREAT 24 04/06/2019     Lab Results  Component Value Date   CHOL 130 07/25/2020   HDL 52 07/25/2020   LDLCALC 60 07/25/2020   TRIG 96 07/25/2020   CHOLHDL 2.5 07/25/2020      ASSESSMENT / PLAN / RECOMMENDATIONS:   1) Type 2 Diabetes Mellitus, Poorly  Controlled, With neuropathic complications - Most recent A1c of 6.9%. Goal A1c < 7.0 %.   - His A1c is optimal but has been noted with severe fasting  hyperglycemia in the mornings and occasional hypoglycemia during the day, his hypoglycemia during the day is due to insulin; carb mismatched we have tried to work on this in the past, he was referred to our RD for education.  - I have increased his basaglar to 55 units back in March but today he tells me he forgot and has been taking 50 units, will increase back to 55 units  - He is  on pt assistance program   MEDICATIONS:    Increase  Basaglar to  55 units daily   Continue Humalog  25 units with Breakfast and Lunch and 30 units with supper   EDUCATION / INSTRUCTIONS:  BG monitoring instructions: Patient is instructed to check his blood sugars 4 times a day, before meals and bedtime  Call [facility] if: BG persistently < 70  . I reviewed the Rule of 15 for the treatment of hypoglycemia in detail with the patient. Literature supplied.  2) Diabetic complications:   Eye: Does not have known diabetic retinopathy.   Neuro/ Feet: Does have known diabetic peripheral neuropathy.  Renal: Patient does not have known baseline CKD. He is on an ACEI/ARB at present.       F/U in 6 months   Signed electronically by: Mack Guise, MD  [facility]  [address] [place], [place] [address] Phone: [phone] FAX: [phone]   CC: Elby Showers, MD [address] [address] Phone: [phone]  Fax: [phone]  Return to Endocrinology clinic as below: Future Appointments  Date Time Provider [place]  01/28/2021  9:20 AM Elby Showers, MD [facility] MJB  01/30/2021 10:45 AM Baxley, Cresenciano Lick, MD [facility] MJB

## 2020-12-30 DIAGNOSIS — L570 Actinic keratosis: Secondary | ICD-10-CM | POA: Diagnosis not present

## 2020-12-30 DIAGNOSIS — Z85828 Personal history of other malignant neoplasm of skin: Secondary | ICD-10-CM | POA: Diagnosis not present

## 2020-12-30 DIAGNOSIS — L821 Other seborrheic keratosis: Secondary | ICD-10-CM | POA: Diagnosis not present

## 2020-12-30 DIAGNOSIS — D225 Melanocytic nevi of trunk: Secondary | ICD-10-CM | POA: Diagnosis not present

## 2021-01-10 ENCOUNTER — Telehealth: Payer: Self-pay | Admitting: Internal Medicine

## 2021-01-10 NOTE — Telephone Encounter (Signed)
Received fax from [facility] that it is time for patient to reapply for assistance so he would continue to receving Humalog.  Message left for patient to return my call.  Will need to complete a new application and need proof of income.

## 2021-01-14 NOTE — Telephone Encounter (Signed)
Spoken to patient and he will come by to pick up application

## 2021-01-15 NOTE — Telephone Encounter (Signed)
Patient picked up application today.

## 2021-01-28 ENCOUNTER — Other Ambulatory Visit: Payer: Medicare HMO | Admitting: Internal Medicine

## 2021-01-28 ENCOUNTER — Other Ambulatory Visit: Payer: Self-pay

## 2021-01-28 DIAGNOSIS — E1169 Type 2 diabetes mellitus with other specified complication: Secondary | ICD-10-CM

## 2021-01-28 DIAGNOSIS — I10 Essential (primary) hypertension: Secondary | ICD-10-CM

## 2021-01-28 DIAGNOSIS — E781 Pure hyperglyceridemia: Secondary | ICD-10-CM

## 2021-01-30 ENCOUNTER — Ambulatory Visit: Payer: Medicare HMO | Admitting: Internal Medicine

## 2021-01-30 ENCOUNTER — Other Ambulatory Visit: Payer: Medicare HMO | Admitting: Internal Medicine

## 2021-01-30 ENCOUNTER — Other Ambulatory Visit: Payer: Self-pay | Admitting: Internal Medicine

## 2021-01-30 DIAGNOSIS — E1142 Type 2 diabetes mellitus with diabetic polyneuropathy: Secondary | ICD-10-CM

## 2021-01-30 DIAGNOSIS — Z794 Long term (current) use of insulin: Secondary | ICD-10-CM

## 2021-01-30 MED ORDER — BASAGLAR KWIKPEN 100 UNIT/ML ~~LOC~~ SOPN: 55.0000 [IU] | PEN_INJECTOR | Freq: Every day | SUBCUTANEOUS | 4 refills | Status: DC

## 2021-01-31 ENCOUNTER — Ambulatory Visit: Payer: Medicare HMO | Admitting: Internal Medicine

## 2021-02-06 ENCOUNTER — Other Ambulatory Visit: Payer: Medicare HMO | Admitting: Internal Medicine

## 2021-02-06 ENCOUNTER — Other Ambulatory Visit: Payer: Self-pay

## 2021-02-06 ENCOUNTER — Encounter: Payer: Self-pay | Admitting: Internal Medicine

## 2021-02-06 DIAGNOSIS — E1169 Type 2 diabetes mellitus with other specified complication: Secondary | ICD-10-CM | POA: Diagnosis not present

## 2021-02-06 DIAGNOSIS — E781 Pure hyperglyceridemia: Secondary | ICD-10-CM | POA: Diagnosis not present

## 2021-02-06 DIAGNOSIS — I10 Essential (primary) hypertension: Secondary | ICD-10-CM | POA: Diagnosis not present

## 2021-02-06 DIAGNOSIS — E785 Hyperlipidemia, unspecified: Secondary | ICD-10-CM | POA: Diagnosis not present

## 2021-02-06 NOTE — Addendum Note (Signed)
Addended by: Mady Haagensen on: 02/06/2021 09:58 AM   Modules accepted: Orders

## 2021-02-07 ENCOUNTER — Ambulatory Visit (INDEPENDENT_AMBULATORY_CARE_PROVIDER_SITE_OTHER): Payer: Medicare HMO | Admitting: Internal Medicine

## 2021-02-07 ENCOUNTER — Encounter: Payer: Self-pay | Admitting: Internal Medicine

## 2021-02-07 VITALS — BP 130/90 | HR 80 | Ht 68.0 in | Wt 168.0 lb

## 2021-02-07 DIAGNOSIS — K3184 Gastroparesis: Secondary | ICD-10-CM

## 2021-02-07 DIAGNOSIS — E785 Hyperlipidemia, unspecified: Secondary | ICD-10-CM

## 2021-02-07 DIAGNOSIS — I10 Essential (primary) hypertension: Secondary | ICD-10-CM

## 2021-02-07 DIAGNOSIS — E1169 Type 2 diabetes mellitus with other specified complication: Secondary | ICD-10-CM

## 2021-02-07 DIAGNOSIS — I351 Nonrheumatic aortic (valve) insufficiency: Secondary | ICD-10-CM

## 2021-02-07 DIAGNOSIS — E1165 Type 2 diabetes mellitus with hyperglycemia: Secondary | ICD-10-CM

## 2021-02-07 DIAGNOSIS — E1143 Type 2 diabetes mellitus with diabetic autonomic (poly)neuropathy: Secondary | ICD-10-CM

## 2021-02-07 LAB — LIPID PANEL
Cholesterol: 163 mg/dL (ref <200)
HDL: 57 mg/dL (ref >=40)
LDL Cholesterol (Calc): 83 mg/dL (calc)
Non-HDL Cholesterol (Calc): 106 mg/dL (calc) (ref <130)
Total CHOL/HDL Ratio: 2.9 (calc) (ref <5.0)
Triglycerides: 134 mg/dL (ref <150)

## 2021-02-07 LAB — HEPATIC FUNCTION PANEL
AG Ratio: 2 (calc) (ref 1.0–2.5)
ALT: 20 U/L (ref 9–46)
AST: 17 U/L (ref 10–35)
Albumin: 4.6 g/dL (ref 3.6–5.1)
Alkaline phosphatase (APISO): 64 U/L (ref 35–144)
Bilirubin, Direct: 0.1 mg/dL (ref 0.0–0.2)
Globulin: 2.3 g/dL (calc) (ref 1.9–3.7)
Indirect Bilirubin: 0.4 mg/dL (calc) (ref 0.2–1.2)
Total Bilirubin: 0.5 mg/dL (ref 0.2–1.2)
Total Protein: 6.9 g/dL (ref 6.1–8.1)

## 2021-02-07 LAB — HEMOGLOBIN A1C
Hgb A1c MFr Bld: 8.6 % of total Hgb — ABNORMAL HIGH (ref <5.7)
Mean Plasma Glucose: 200 mg/dL
eAG (mmol/L): 11.1 mmol/L

## 2021-02-07 NOTE — Progress Notes (Signed)
   Subjective:    Patient ID: Joseph Dixon, male    DOB: 01-13-1951, 70 y.o.   MRN: [id number]  HPI 70 year old Male for 6 month recheck.  Diagnosed with type 2 diabetes mellitus in 2009.  History of aortic insufficiency and hyperlipidemia.  He is followed by low our endocrinology.  History of bilateral cataracts and has been noted to be a glaucoma suspect.  He is intolerant of Metformin and is allergic to sulfa.  He does not smoke.  Drinks several beers a month and is retired.  Not a lot of exercise.  History of mild to moderate aortic regurgitation followed by cardiology.  Has grade 1 diastolic dysfunction and right ventricle is mildly dilated.  Mitral valve has calcified annulus.  He has had 3 COVID immunizations.  Pneumococcal immunizations up-to-date.  Had Tdap in 2016.  Had flu vaccine September 2021.  Review of Systems says exercise tolerance has decreased. No chest pain. No SOB.     Objective:   Physical Exam Blood pressure 130/90 pulse 80 pulse oximetry 98% weight 168 pounds BMI 25.5   Skin is warm and dry.  Nodes none.  Neck is supple.  No carotid bruits.  Chest is clear to auscultation.  Cardiac exam regular rate and rhythm 3/6 systolic ejection murmur.  No lower extremity pitting edema.  Seems to be developing 1 or 2 toe ulcers and needs to see a podiatrist.     Assessment & Plan:  Insulin-dependent type 2 diabetes mellitus followed by endocrinology-hemoglobin A1c has increased from 6.9% in December to 8.6%.  Discussion regarding diet and exercise.  Hyperlipidemia-lipid panel is normal on statin medication  Aortic regurgitation followed by cardiology  History of bilateral cataracts  Plan: He will try to get back on track with diet and exercise.  Has appointment with endocrinologist in June.  His health maintenance exam here is in August.  No change in medications just needs to watch his diet and get some exercise.

## 2021-03-09 NOTE — Patient Instructions (Signed)
Please make an appointment to see podiatrist about your feet.  I think he may be developing a toe ulcer.  Try to get back on track with diet and exercise because diabetes is not under good control at the present time.  Cardiac issue seems to be stable.  Return here in 6 months for Medicare wellness and health maintenance exam.

## 2021-03-13 ENCOUNTER — Other Ambulatory Visit: Payer: Self-pay | Admitting: Internal Medicine

## 2021-05-19 ENCOUNTER — Other Ambulatory Visit: Payer: Self-pay | Admitting: Internal Medicine

## 2021-05-22 ENCOUNTER — Ambulatory Visit: Payer: Medicare HMO | Admitting: Internal Medicine

## 2021-05-29 DIAGNOSIS — E119 Type 2 diabetes mellitus without complications: Secondary | ICD-10-CM | POA: Diagnosis not present

## 2021-05-29 DIAGNOSIS — Z794 Long term (current) use of insulin: Secondary | ICD-10-CM | POA: Diagnosis not present

## 2021-05-29 DIAGNOSIS — H25813 Combined forms of age-related cataract, bilateral: Secondary | ICD-10-CM | POA: Diagnosis not present

## 2021-05-29 DIAGNOSIS — H52223 Regular astigmatism, bilateral: Secondary | ICD-10-CM | POA: Diagnosis not present

## 2021-05-29 DIAGNOSIS — H5213 Myopia, bilateral: Secondary | ICD-10-CM | POA: Diagnosis not present

## 2021-05-29 DIAGNOSIS — H35033 Hypertensive retinopathy, bilateral: Secondary | ICD-10-CM | POA: Diagnosis not present

## 2021-05-29 DIAGNOSIS — H40013 Open angle with borderline findings, low risk, bilateral: Secondary | ICD-10-CM | POA: Diagnosis not present

## 2021-05-29 DIAGNOSIS — H524 Presbyopia: Secondary | ICD-10-CM | POA: Diagnosis not present

## 2021-05-29 DIAGNOSIS — H35433 Paving stone degeneration of retina, bilateral: Secondary | ICD-10-CM | POA: Diagnosis not present

## 2021-05-29 LAB — HM DIABETES EYE EXAM

## 2021-05-30 ENCOUNTER — Encounter: Payer: Self-pay | Admitting: Internal Medicine

## 2021-06-07 ENCOUNTER — Other Ambulatory Visit: Payer: Self-pay | Admitting: Internal Medicine

## 2021-06-18 ENCOUNTER — Other Ambulatory Visit: Payer: Self-pay

## 2021-06-18 ENCOUNTER — Encounter: Payer: Self-pay | Admitting: Internal Medicine

## 2021-06-18 ENCOUNTER — Ambulatory Visit (INDEPENDENT_AMBULATORY_CARE_PROVIDER_SITE_OTHER): Payer: Medicare HMO | Admitting: Internal Medicine

## 2021-06-18 VITALS — BP 144/68 | HR 76 | Ht 68.0 in | Wt 170.0 lb

## 2021-06-18 DIAGNOSIS — E1165 Type 2 diabetes mellitus with hyperglycemia: Secondary | ICD-10-CM | POA: Diagnosis not present

## 2021-06-18 DIAGNOSIS — E1142 Type 2 diabetes mellitus with diabetic polyneuropathy: Secondary | ICD-10-CM | POA: Diagnosis not present

## 2021-06-18 DIAGNOSIS — Z794 Long term (current) use of insulin: Secondary | ICD-10-CM

## 2021-06-18 LAB — POCT GLYCOSYLATED HEMOGLOBIN (HGB A1C): Hemoglobin A1C: 7.5 % — AB (ref 4.0–5.6)

## 2021-06-18 MED ORDER — DAPAGLIFLOZIN PROPANEDIOL 5 MG PO TABS: 5.0000 mg | ORAL_TABLET | Freq: Every day | ORAL | 6 refills | Status: DC

## 2021-06-18 MED ORDER — BASAGLAR KWIKPEN 100 UNIT/ML ~~LOC~~ SOPN: 55.0000 [IU] | PEN_INJECTOR | Freq: Every day | SUBCUTANEOUS | 3 refills | Status: DC

## 2021-06-18 NOTE — Patient Instructions (Addendum)
-   Continue Basaglar 55 units  - Continue Humalog 25 units with Breakfast and Lunch  - Continue Humalog  30 units with Supper  - Start Farxiga 5 mg, 1 tablet daily   HOW TO TREAT LOW BLOOD SUGARS (Blood sugar LESS THAN 70 MG/DL) Please follow the RULE OF 15 for the treatment of hypoglycemia treatment (when your (blood sugars are less than 70 mg/dL)   STEP 1: Take 15 grams of carbohydrates when your blood sugar is low, which includes:  3-4 GLUCOSE TABS  OR 3-4 OZ OF JUICE OR REGULAR SODA OR ONE TUBE OF GLUCOSE GEL    STEP 2: RECHECK blood sugar in 15 MINUTES STEP 3: If your blood sugar is still low at the 15 minute recheck --> then, go back to STEP 1 and treat AGAIN with another 15 grams of carbohydrates.

## 2021-06-18 NOTE — Progress Notes (Signed)
Name: Joseph Dixon  Age/ Sex: 70 y.o., male   MRN/ DOB: [id number], 25-May-1951     PCP: Elby Showers, MD   Reason for Endocrinology Evaluation: Type 2 Diabetes Mellitus  Initial Endocrine Consultative Visit: 11/29/2018    PATIENT IDENTIFIER: Joseph Dixon is a 70 y.o. male with a past medical history of DM, Oartic insufficiency and hyperlipidemia . The patient has followed with Endocrinology clinic since 11/29/2018 for consultative assistance with management of his diabetes.  DIABETIC HISTORY:  Joseph Dixon was diagnosed with T2DM ~ 2009, he does not recall any anti-glycemic agents he has been on, he has been on insulin for many years.  His hemoglobin A1c has ranged from 8.0% in 2019, peaking at 12.4% in 2016.   Pt with chronic intermittent Hx of diarrhea. That at times he attributes to Hx  ETOH intake.   Farxiga started 05/2021  SUBJECTIVE:   During the last visit (11/21/2020): A1c 6.9 %. We adjusted MDI regimen.     Today (06/18/2021): [name] is here for a 4 month follow up on his diabetes management.  He checks his blood sugars 3 times daily. The patient has had hypoglycemic episodes since the last clinic visit. He has been having occasional bedtime snacks.    Denies sob, nausea or diarrhea  Lost wife in 01/2021 Has a podiatrist   HOME DIABETES REGIMEN:  Basaglar 55 units daily  Humalog  25 units with breakfast and Lunch and 30 units with Supper       GLUCOSE LOG: Fasting  84- 455 mg/dL    DIABETIC COMPLICATIONS: Microvascular complications:  Neuropathy  Denies: retinopathy, nephropathy  Last eye exam: Completed 05/2019   Macrovascular complications:  Denies: CAD, PVD, CVA     HISTORY:  Past Medical History:  Past Medical History:  Diagnosis Date   Cancer ([place])    basal cell both shoulders   Diabetes mellitus    insulin dependant   Hyperlipidemia    Past Surgical History:  Past Surgical History:  Procedure Laterality Date   repair of rt  knee laceration     Social History:  reports that he has quit smoking. He has never used smokeless tobacco. He reports that he does not drink alcohol and does not use drugs. Family History:  Family History  Problem Relation Age of Onset   Heart disease Father    Diabetes Father    Mental illness Father    Alzheimer's disease Father    COPD Brother    Diabetes Mother    Alzheimer's disease Mother    Colon cancer Neg Hx      HOME MEDICATIONS: Allergies as of 06/18/2021       Reactions   Sulfa Antibiotics    hallucinations        Medication List        Accurate as of June 18, 2021 10:40 AM. If you have any questions, ask your nurse or doctor.          ALPRAZolam 0.25 MG tablet Commonly known as: XANAX TAKE 1 TABLET BY MOUTH EVERYDAY AT BEDTIME   aspirin EC 81 MG tablet Take 1 tablet (81 mg total) by mouth daily.   Basaglar KwikPen 100 UNIT/ML Inject 55 Units into the skin daily.   BD Pen Needle Nano 2nd Gen 32G X 4 MM Misc Generic drug: Insulin Pen Needle USE 1 PEN NEEDLE DAILY   blood glucose meter kit and supplies Kit Dispense based on patient  and insurance preference. ICD-10: E11.9   dapagliflozin propanediol 5 MG Tabs tablet Commonly known as: Farxiga Take 1 tablet (5 mg total) by mouth daily before breakfast. Started by: Dorita Sciara, MD   glucose blood test strip Use as instructed to check blood sugar before meals and at bedtime What changed:  how much to take how to take this when to take this   HumaLOG KwikPen 200 UNIT/ML KwikPen Generic drug: insulin lispro Max daily 90 units   hydrochlorothiazide 25 MG tablet Commonly known as: HYDRODIURIL TAKE 1 TABLET BY MOUTH EVERY DAY. NEED OFFICE VISIT   losartan 100 MG tablet Commonly known as: COZAAR TAKE 1 TABLET BY MOUTH EVERY DAY   metoCLOPramide 5 MG tablet Commonly known as: REGLAN TAKE 1 TABLET BY MOUTH 3 TIMES A DAY BEFORE MEALS   onetouch ultrasoft lancets Use as  instructed   pantoprazole 40 MG tablet Commonly known as: PROTONIX TAKE 1 TABLET BY MOUTH EVERY DAY   rosuvastatin 20 MG tablet Commonly known as: CRESTOR TAKE 1 TABLET BY MOUTH EVERY DAY   Viagra 100 MG tablet Generic drug: sildenafil TAKE 1 TABLET 1 HOUR BEFORE INTERCOURSE        PHYSICAL EXAM: VS: BP (!) 144/68   Pulse 76   Ht _0  (1.727 m)   Wt 170 lb (77.1 kg)   SpO2 99%   BMI 25.85 kg/m    EXAM: General: Pt appears well and is in NAD  Lungs: Clear with good BS bilat with no rales, rhonchi, or wheezes  Heart: Auscultation: RRR with normal S1 and S2   Extremities:  BL LE: no pretibial edema   Mental Status: Judgment, insight: intact Orientation: oriented to time, place, and person Mood and affect: no depression, anxiety, or agitation     DM Foot Exam  06/18/2021  The skin of the feet is  without sores or ulcerations but has bilateral heel callous formation  The pedal pulses are decreased bilaterally  The sensation is decreased to a screening 5.07, 10 gram monofilament bilaterally    DATA REVIEWED:  Lab Results  Component Value Date   HGBA1C 7.5 (A) 06/18/2021   HGBA1C 8.6 (H) 02/06/2021   HGBA1C 6.9 (A) 11/21/2020   Lab Results  Component Value Date   MICROALBUR 1.7 04/06/2019   LDLCALC 83 02/06/2021   CREATININE 0.98 07/25/2020   Lab Results  Component Value Date   MICRALBCREAT 24 04/06/2019     Lab Results  Component Value Date   CHOL 163 02/06/2021   HDL 57 02/06/2021   LDLCALC 83 02/06/2021   TRIG 134 02/06/2021   CHOLHDL 2.9 02/06/2021      ASSESSMENT / PLAN / RECOMMENDATIONS:   1) Type 2 Diabetes Mellitus, sub-- optimally controlled, With neuropathic complications - Most recent A1c of 7.5 %. Goal A1c < 7.0 %.   -His A1c is trending down, and review of his glucose readings they continue to fluctuate with severe glycemic excursions - He is on pt assistance program for his insulin -We had discussed CGM technology in the past but  he is not keen on this -Has chronic history of GI issues, and he would like to avoid any medications that we will cause GI side effects.  Not a candidate for GLP-1 agonist nor metformin -Today we discussed SGLT2 inhibitors, he is open to giving this a try, used to be on Jardiance in the past, cautioned against genital infections   MEDICATIONS:   Continue Basaglar 55 units daily  Continue Humalog  25 units with Breakfast and Lunch and between 25-30 units with supper Start Farxiga 5 mg, 1 tablet daily   EDUCATION / INSTRUCTIONS: BG monitoring instructions: Patient is instructed to check his blood sugars 4 times a day, before meals and bedtime Call [facility] if: BG persistently < 70  I reviewed the Rule of 15 for the treatment of hypoglycemia in detail with the patient. Literature supplied.  2) Diabetic complications:  Eye: Does not have known diabetic retinopathy.  Neuro/ Feet: Does have known diabetic peripheral neuropathy. Renal: Patient does not have known baseline CKD. He is on an ACEI/ARB at present.           F/U in 4 months   Signed electronically by: Mack Guise, MD  [facility]  [facility] [address] [place], [place] [address] Phone: [phone] FAX: [phone]   CC: Elby Showers, MD [address] [address] Phone: [phone]  Fax: [phone]  Return to Endocrinology clinic as below: Future Appointments  Date Time Provider [place]  06/24/2021  9:40 AM Wellington Hampshire, MD [facility]  07/29/2021  9:20 AM Elby Showers, MD [facility]  07/31/2021 11:00 AM Baxley, Cresenciano Lick, MD [facility]

## 2021-06-24 ENCOUNTER — Ambulatory Visit: Payer: Medicare HMO | Admitting: Cardiovascular Disease

## 2021-07-22 ENCOUNTER — Other Ambulatory Visit: Payer: Self-pay

## 2021-07-22 ENCOUNTER — Ambulatory Visit: Payer: Medicare HMO | Admitting: Cardiovascular Disease

## 2021-07-22 ENCOUNTER — Encounter: Payer: Self-pay | Admitting: Cardiovascular Disease

## 2021-07-22 VITALS — BP 138/76 | HR 68 | Ht 69.0 in | Wt 165.4 lb

## 2021-07-22 DIAGNOSIS — E785 Hyperlipidemia, unspecified: Secondary | ICD-10-CM | POA: Diagnosis not present

## 2021-07-22 DIAGNOSIS — I739 Peripheral vascular disease, unspecified: Secondary | ICD-10-CM

## 2021-07-22 DIAGNOSIS — I10 Essential (primary) hypertension: Secondary | ICD-10-CM | POA: Diagnosis not present

## 2021-07-22 NOTE — Patient Instructions (Signed)
Medication Instructions:  No changes *If you need a refill on your cardiac medications before your next appointment, please call your pharmacy*   Lab Work: None ordered If you have labs (blood work) drawn today and your tests are completely normal, you will receive your results only by: [place] (if you have MyChart) OR A paper copy in the mail If you have any lab test that is abnormal or we need to change your treatment, we will call you to review the results.   Testing/Procedures: None ordered   Follow-Up: At [facility], you and your health needs are our priority.  As part of our continuing mission to provide you with exceptional heart care, we have created designated Provider Care Teams.  These Care Teams include your primary Cardiologist (physician) and Advanced Practice Providers (APPs -  Physician Assistants and Nurse Practitioners) who all work together to provide you with the care you need, when you need it.  We recommend signing up for the patient portal called "MyChart".  Sign up information is provided on this After Visit Summary.  MyChart is used to connect with patients for Virtual Visits (Telemedicine).  Patients are able to view lab/test results, encounter notes, upcoming appointments, etc.  Non-urgent messages can be sent to your provider as well.   To learn more about what you can do with MyChart, go to [email or web address].    Your next appointment:   12 month(s)  The format for your next appointment:   In Person  Provider:   You may see Dr. Fletcher Anon or one of the following Advanced Practice Providers on your designated Care Team:   [place], PA-C Coletta Memos, FNP

## 2021-07-22 NOTE — Progress Notes (Signed)
Cardiology Office Note   Date:  07/22/2021   ID:  Joseph Dixon, DOB 07-21-1951, MRN [id number]  PCP:  Elby Showers, MD  Cardiologist:   Kathlyn Sacramento, MD   No chief complaint on file.     History of Present Illness: Joseph Dixon is a 70 y.o. male who is here today  for a follow-up visit regarding peripheral arterial disease.  The patient has known history of diabetes mellitus, hypertension and hyperlipidemia.   He is not a smoker and has no family history of premature coronary artery disease. The patient was seen in February 21, 2018 for a slow healing  small ulceration on the left big toe. He underwent noninvasive vascular evaluation which showed near normal ABI.  Duplex showed mainly tibial disease with an occluded anterior tibial artery on the right and an occluded anterior tibial and peroneal arteries on the left.  The ulceration healed without need for revascularization.   He has been doing well with no reported chest pain or shortness of breath.  He tries to walk most of the days and reports no leg claudication.  No lower extremity ulceration.  Unfortunately, his wife died in 02-21-23.   Past Medical History:  Diagnosis Date   Cancer ([place])    basal cell both shoulders   Diabetes mellitus    insulin dependant   Hyperlipidemia     Past Surgical History:  Procedure Laterality Date   repair of rt knee laceration       Current Outpatient Medications  Medication Sig Dispense Refill   ALPRAZolam (XANAX) 0.25 MG tablet TAKE 1 TABLET BY MOUTH EVERYDAY AT BEDTIME 90 tablet 0   aspirin EC 81 MG tablet Take 1 tablet (81 mg total) by mouth daily. 90 tablet 3   BD PEN NEEDLE NANO 2ND GEN 32G X 4 MM MISC USE 1 PEN NEEDLE DAILY 100 each 2   blood glucose meter kit and supplies KIT Dispense based on patient and insurance preference. ICD-10: E11.9 1 each 0   dapagliflozin propanediol (FARXIGA) 5 MG TABS tablet Take 1 tablet (5 mg total) by mouth daily before breakfast. 30 tablet 6   glucose  blood test strip Use as instructed to check blood sugar before meals and at bedtime (Patient taking differently: 1 each by Other route 2 (two) times daily. Use as instructed to check blood sugar before meals and at bedtime) 200 each 12   hydrochlorothiazide (HYDRODIURIL) 25 MG tablet TAKE 1 TABLET BY MOUTH EVERY DAY. NEED OFFICE VISIT 90 tablet 1   Insulin Glargine (BASAGLAR KWIKPEN) 100 UNIT/ML Inject 55 Units into the skin daily. 60 mL 3   insulin lispro (HUMALOG KWIKPEN) 200 UNIT/ML KwikPen Max daily 90 units 45 mL 3   Lancets (ONETOUCH ULTRASOFT) lancets Use as instructed 200 each 12   losartan (COZAAR) 100 MG tablet TAKE 1 TABLET BY MOUTH EVERY DAY 90 tablet 1   metoCLOPramide (REGLAN) 5 MG tablet TAKE 1 TABLET BY MOUTH 3 TIMES A DAY BEFORE MEALS 270 tablet 1   pantoprazole (PROTONIX) 40 MG tablet TAKE 1 TABLET BY MOUTH EVERY DAY 90 tablet 3   rosuvastatin (CRESTOR) 20 MG tablet TAKE 1 TABLET BY MOUTH EVERY DAY 90 tablet 1   VIAGRA 100 MG tablet TAKE 1 TABLET 1 HOUR BEFORE INTERCOURSE 4 tablet 11   No current facility-administered medications for this visit.    Allergies:   Sulfa antibiotics    Social History:  The patient  reports that he has quit  smoking. He has never used smokeless tobacco. He reports that he does not drink alcohol and does not use drugs.   Family History:  The patient's family history includes Alzheimer's disease in his father and mother; COPD in his brother; Diabetes in his father and mother; Heart disease in his father; Mental illness in his father.    ROS:  Please see the history of present illness.   Otherwise, review of systems are positive for none.   All other systems are reviewed and negative.    PHYSICAL EXAM: VS:  BP 138/76   Pulse 68   Ht 5' 9"  (1.753 m)   Wt 165 lb 6.4 oz (75 kg)   SpO2 98%   BMI 24.43 kg/m  , BMI Body mass index is 24.43 kg/m. GEN: Well nourished, well developed, in no acute distress  HEENT: normal  Neck: no JVD, carotid  bruits, or masses Cardiac: RRR; no murmurs, rubs, or gallops,no edema  Respiratory:  clear to auscultation bilaterally, normal work of breathing GI: soft, nontender, nondistended, + BS MS: no deformity or atrophy  Skin: warm and dry, no rash Neuro:  Strength and sensation are intact Psych: euthymic mood, full affect Vascular: Femoral pulses normal bilaterally.  Posterior tibial is +1 bilaterally.  Dorsalis pedis is not palpable.   EKG:  EKG is ordered today. EKG showed normal sinus rhythm with nonspecific T wave changes.   Recent Labs: 07/25/2020: BUN 20; Creat 0.98; Hemoglobin 15.3; Platelets 233; Potassium 4.7; Sodium 139 02/06/2021: ALT 20    Lipid Panel    Component Value Date/Time   CHOL 163 02/06/2021 1110   TRIG 134 02/06/2021 1110   HDL 57 02/06/2021 1110   CHOLHDL 2.9 02/06/2021 1110   VLDL 49 (H) 03/25/2017 1056   LDLCALC 83 02/06/2021 1110      Wt Readings from Last 3 Encounters:  07/22/21 165 lb 6.4 oz (75 kg)  06/18/21 170 lb (77.1 kg)  02/07/21 168 lb (76.2 kg)       No flowsheet data found.    ASSESSMENT AND PLAN:  1.  Peripheral arterial disease: The patient has known tibial disease but currently has no claudication or lower extremity ulceration.  Thus, I recommend continuing medical therapy.  I advised him to continue with his walking program.  Continue low-dose aspirin.    2.  Essential hypertension: Blood pressure is reasonably controlled.  3.  Hyperlipidemia: I reviewed most recent lipid profile done in February which showed an LDL of 83.  Continue treatment with rosuvastatin.  His LDL was better last year at 54.  4.  Diabetes mellitus: Gradual improvement with most recent hemoglobin A1c of 7.5.    Disposition:   FU with me in 12 months  Signed,  Kathlyn Sacramento, MD  07/22/2021 8:37 AM    [place]

## 2021-07-28 DIAGNOSIS — D485 Neoplasm of uncertain behavior of skin: Secondary | ICD-10-CM | POA: Diagnosis not present

## 2021-07-28 DIAGNOSIS — L570 Actinic keratosis: Secondary | ICD-10-CM | POA: Diagnosis not present

## 2021-07-28 DIAGNOSIS — C44519 Basal cell carcinoma of skin of other part of trunk: Secondary | ICD-10-CM | POA: Diagnosis not present

## 2021-07-28 DIAGNOSIS — D225 Melanocytic nevi of trunk: Secondary | ICD-10-CM | POA: Diagnosis not present

## 2021-07-28 DIAGNOSIS — Z85828 Personal history of other malignant neoplasm of skin: Secondary | ICD-10-CM | POA: Diagnosis not present

## 2021-07-28 DIAGNOSIS — B078 Other viral warts: Secondary | ICD-10-CM | POA: Diagnosis not present

## 2021-07-29 ENCOUNTER — Other Ambulatory Visit: Payer: Medicare HMO | Admitting: Internal Medicine

## 2021-07-29 ENCOUNTER — Other Ambulatory Visit: Payer: Self-pay

## 2021-07-29 DIAGNOSIS — E785 Hyperlipidemia, unspecified: Secondary | ICD-10-CM | POA: Diagnosis not present

## 2021-07-29 DIAGNOSIS — F5102 Adjustment insomnia: Secondary | ICD-10-CM

## 2021-07-29 DIAGNOSIS — I351 Nonrheumatic aortic (valve) insufficiency: Secondary | ICD-10-CM | POA: Diagnosis not present

## 2021-07-29 DIAGNOSIS — E1169 Type 2 diabetes mellitus with other specified complication: Secondary | ICD-10-CM | POA: Diagnosis not present

## 2021-07-29 DIAGNOSIS — I10 Essential (primary) hypertension: Secondary | ICD-10-CM | POA: Diagnosis not present

## 2021-07-29 DIAGNOSIS — M7740 Metatarsalgia, unspecified foot: Secondary | ICD-10-CM

## 2021-07-29 DIAGNOSIS — E1165 Type 2 diabetes mellitus with hyperglycemia: Secondary | ICD-10-CM

## 2021-07-29 DIAGNOSIS — R69 Illness, unspecified: Secondary | ICD-10-CM | POA: Diagnosis not present

## 2021-07-29 DIAGNOSIS — Z0000 Encounter for general adult medical examination without abnormal findings: Secondary | ICD-10-CM | POA: Diagnosis not present

## 2021-07-29 DIAGNOSIS — E1143 Type 2 diabetes mellitus with diabetic autonomic (poly)neuropathy: Secondary | ICD-10-CM | POA: Diagnosis not present

## 2021-07-29 DIAGNOSIS — Z125 Encounter for screening for malignant neoplasm of prostate: Secondary | ICD-10-CM | POA: Diagnosis not present

## 2021-07-29 DIAGNOSIS — K3184 Gastroparesis: Secondary | ICD-10-CM

## 2021-07-30 LAB — COMPLETE METABOLIC PANEL WITH GFR
AG Ratio: 1.8 (calc) (ref 1.0–2.5)
ALT: 20 U/L (ref 9–46)
AST: 16 U/L (ref 10–35)
Albumin: 4.2 g/dL (ref 3.6–5.1)
Alkaline phosphatase (APISO): 57 U/L (ref 35–144)
BUN: 19 mg/dL (ref 7–25)
CO2: 30 mmol/L (ref 20–32)
Calcium: 9.5 mg/dL (ref 8.6–10.3)
Chloride: 98 mmol/L (ref 98–110)
Creat: 0.9 mg/dL (ref 0.70–1.35)
Globulin: 2.4 g/dL (calc) (ref 1.9–3.7)
Glucose, Bld: 140 mg/dL — ABNORMAL HIGH (ref 65–99)
Potassium: 3.5 mmol/L (ref 3.5–5.3)
Sodium: 137 mmol/L (ref 135–146)
Total Bilirubin: 0.4 mg/dL (ref 0.2–1.2)
Total Protein: 6.6 g/dL (ref 6.1–8.1)
eGFR: 93 mL/min/{1.73_m2} (ref >=60)

## 2021-07-30 LAB — LIPID PANEL
Cholesterol: 156 mg/dL (ref <200)
HDL: 47 mg/dL (ref >=40)
LDL Cholesterol (Calc): 81 mg/dL (calc)
Non-HDL Cholesterol (Calc): 109 mg/dL (calc) (ref <130)
Total CHOL/HDL Ratio: 3.3 (calc) (ref <5.0)
Triglycerides: 185 mg/dL — ABNORMAL HIGH (ref <150)

## 2021-07-30 LAB — CBC WITH DIFFERENTIAL/PLATELET
Absolute Monocytes: 560 cells/uL (ref 200–950)
Basophils Absolute: 80 cells/uL (ref 0–200)
Basophils Relative: 1 %
Eosinophils Absolute: 192 cells/uL (ref 15–500)
Eosinophils Relative: 2.4 %
HCT: 48.2 % (ref 38.5–50.0)
Hemoglobin: 16.1 g/dL (ref 13.2–17.1)
Lymphs Abs: 2160 cells/uL (ref 850–3900)
MCH: 30.1 pg (ref 27.0–33.0)
MCHC: 33.4 g/dL (ref 32.0–36.0)
MCV: 90.3 fL (ref 80.0–100.0)
MPV: 12.2 fL (ref 7.5–12.5)
Monocytes Relative: 7 %
Neutro Abs: 5008 cells/uL (ref 1500–7800)
Neutrophils Relative %: 62.6 %
Platelets: 215 10*3/uL (ref 140–400)
RBC: 5.34 10*6/uL (ref 4.20–5.80)
RDW: 13.7 % (ref 11.0–15.0)
Total Lymphocyte: 27 %
WBC: 8 10*3/uL (ref 3.8–10.8)

## 2021-07-30 LAB — HEMOGLOBIN A1C
Hgb A1c MFr Bld: 7.6 % of total Hgb — ABNORMAL HIGH (ref <5.7)
Mean Plasma Glucose: 171 mg/dL
eAG (mmol/L): 9.5 mmol/L

## 2021-07-30 LAB — PSA: PSA: 0.35 ng/mL (ref <=4.00)

## 2021-07-30 LAB — MICROALBUMIN / CREATININE URINE RATIO
Creatinine, Urine: 47 mg/dL (ref 20–320)
Microalb Creat Ratio: 4 mcg/mg creat (ref <30)
Microalb, Ur: 0.2 mg/dL

## 2021-07-31 ENCOUNTER — Encounter: Payer: Self-pay | Admitting: Internal Medicine

## 2021-07-31 ENCOUNTER — Other Ambulatory Visit: Payer: Self-pay

## 2021-07-31 ENCOUNTER — Ambulatory Visit (INDEPENDENT_AMBULATORY_CARE_PROVIDER_SITE_OTHER): Payer: Medicare HMO | Admitting: Internal Medicine

## 2021-07-31 VITALS — BP 130/80 | HR 77 | Ht 69.0 in | Wt 164.0 lb

## 2021-07-31 DIAGNOSIS — E781 Pure hyperglyceridemia: Secondary | ICD-10-CM

## 2021-07-31 DIAGNOSIS — I739 Peripheral vascular disease, unspecified: Secondary | ICD-10-CM | POA: Diagnosis not present

## 2021-07-31 DIAGNOSIS — K3184 Gastroparesis: Secondary | ICD-10-CM | POA: Diagnosis not present

## 2021-07-31 DIAGNOSIS — R69 Illness, unspecified: Secondary | ICD-10-CM | POA: Diagnosis not present

## 2021-07-31 DIAGNOSIS — E1169 Type 2 diabetes mellitus with other specified complication: Secondary | ICD-10-CM

## 2021-07-31 DIAGNOSIS — E1143 Type 2 diabetes mellitus with diabetic autonomic (poly)neuropathy: Secondary | ICD-10-CM | POA: Diagnosis not present

## 2021-07-31 DIAGNOSIS — F4321 Adjustment disorder with depressed mood: Secondary | ICD-10-CM

## 2021-07-31 DIAGNOSIS — Z0000 Encounter for general adult medical examination without abnormal findings: Secondary | ICD-10-CM

## 2021-07-31 DIAGNOSIS — I10 Essential (primary) hypertension: Secondary | ICD-10-CM

## 2021-07-31 DIAGNOSIS — I351 Nonrheumatic aortic (valve) insufficiency: Secondary | ICD-10-CM

## 2021-07-31 DIAGNOSIS — E785 Hyperlipidemia, unspecified: Secondary | ICD-10-CM

## 2021-07-31 LAB — POCT URINALYSIS DIPSTICK
Appearance: NEGATIVE
Bilirubin, UA: NEGATIVE
Blood, UA: NEGATIVE
Glucose, UA: POSITIVE — AB
Ketones, UA: NEGATIVE
Leukocytes, UA: NEGATIVE
Nitrite, UA: NEGATIVE
Odor: NEGATIVE
Protein, UA: NEGATIVE
Spec Grav, UA: 1.01 (ref 1.010–1.025)
Urobilinogen, UA: 0.2 E.U./dL
pH, UA: 6.5 (ref 5.0–8.0)
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
## 2021-07-31 NOTE — Patient Instructions (Signed)
I am sorry to hear about the loss of your wife. Continue same meds. Watch diet and RTC in 6 months.

## 2021-08-07 ENCOUNTER — Other Ambulatory Visit: Payer: Self-pay

## 2021-08-07 MED ORDER — SILDENAFIL CITRATE 100 MG PO TABS: ORAL_TABLET | ORAL | 11 refills | Status: DC

## 2021-09-29 DIAGNOSIS — L578 Other skin changes due to chronic exposure to nonionizing radiation: Secondary | ICD-10-CM | POA: Diagnosis not present

## 2021-09-29 DIAGNOSIS — Z85828 Personal history of other malignant neoplasm of skin: Secondary | ICD-10-CM | POA: Diagnosis not present

## 2021-09-29 DIAGNOSIS — L814 Other melanin hyperpigmentation: Secondary | ICD-10-CM | POA: Diagnosis not present

## 2021-09-29 DIAGNOSIS — L570 Actinic keratosis: Secondary | ICD-10-CM | POA: Diagnosis not present

## 2021-10-16 ENCOUNTER — Encounter: Payer: Self-pay | Admitting: Podiatry

## 2021-10-16 ENCOUNTER — Ambulatory Visit: Payer: Medicare HMO | Admitting: Podiatry

## 2021-10-16 ENCOUNTER — Other Ambulatory Visit: Payer: Self-pay

## 2021-10-16 ENCOUNTER — Ambulatory Visit (INDEPENDENT_AMBULATORY_CARE_PROVIDER_SITE_OTHER): Payer: Medicare HMO

## 2021-10-16 DIAGNOSIS — Z794 Long term (current) use of insulin: Secondary | ICD-10-CM

## 2021-10-16 DIAGNOSIS — E1142 Type 2 diabetes mellitus with diabetic polyneuropathy: Secondary | ICD-10-CM | POA: Diagnosis not present

## 2021-10-16 DIAGNOSIS — I739 Peripheral vascular disease, unspecified: Secondary | ICD-10-CM | POA: Diagnosis not present

## 2021-10-16 DIAGNOSIS — L97512 Non-pressure chronic ulcer of other part of right foot with fat layer exposed: Secondary | ICD-10-CM

## 2021-10-16 MED ORDER — CEPHALEXIN 500 MG PO CAPS: 500.0000 mg | ORAL_CAPSULE | Freq: Four times a day (QID) | ORAL | 0 refills | Status: AC

## 2021-10-16 MED ORDER — SANTYL 250 UNIT/GM EX OINT: 1.0000 "application " | TOPICAL_OINTMENT | Freq: Every day | CUTANEOUS | 0 refills | Status: DC

## 2021-10-16 NOTE — Addendum Note (Signed)
Addended by: Lind Guest on: 10/16/2021 02:50 PM   Modules accepted: Orders

## 2021-10-16 NOTE — Progress Notes (Signed)
  Subjective:  Patient ID: Joseph Dixon, male    DOB: 17-Jul-1951,   MRN: [id number]  Chief Complaint  Patient presents with   Diabetic Ulcer    Right great toe- non healing     70 y.o. male presents for new concern of right great toe ulcer that devleped a couple weeks ago. Relates he was at the beach and doing more walking. Has been dressing the wound himself daily with neosporin. Denies any pain . Denies any other pedal complaints. Denies n/v/f/c.   Past Medical History:  Diagnosis Date   Cancer ([place])    basal cell both shoulders   Diabetes mellitus    insulin dependant   Hyperlipidemia     Objective:  Physical Exam: Vascular: DP/PT pulses 2/4 bilateral. CFT <3 seconds. Normal hair growth on digits. No edema.  Skin. No lacerations or abrasions bilateral feet. 2 cm x 1 cm x 0.3 cm with fibrotic base and hyperstatic rim. Some mild surrounding erythema and edema noted. No purulence.  Musculoskeletal: MMT 5/5 bilateral lower extremities in DF, PF, Inversion and Eversion. Deceased ROM in DF of ankle joint.  Neurological: Sensation intact to light touch.   Assessment:   1. Skin ulcer of right great toe, with fat layer exposed ([place])   2. Type 2 diabetes mellitus with diabetic polyneuropathy, with long-term current use of insulin ([place])   3. PAD (peripheral artery disease) ([place])      Plan:  Patient was evaluated and treated and all questions answered. Ulcer right great toe  -Debridement as below. -Dressed with silvaden, DSD. -Santyl sent to pharmacy to be applied daily -Prescription for keflex provided.  -Off-loading with surgical shoe. -Discussed glucose control and proper protein-rich diet.  -Discussed if any worsening redness, pain, fever or chills to call or may need to report to the emergency room. Patient expressed understanding.   Procedure: Excisional Debridement of Wound Rationale: Removal of non-viable soft tissue from the wound to promote healing.  Anesthesia:  none Pre-Debridement Wound Measurements: 0.5 cm x 1.5 cm x 0.2 cm  Post-Debridement Wound Measurements: 2 cm x 1 cm x 0.4 cm  Type of Debridement: Sharp Excisional Tissue Removed: Non-viable soft tissue Depth of Debridement: subcutaneous tissue. Technique: Sharp excisional debridement to bleeding, viable wound base.  Dressing: Dry, sterile, compression dressing. Disposition: Patient tolerated procedure well. Patient to return in 2 week for follow-up.  Return in about 2 weeks (around 10/30/2021) for wound check.   Lorenda Peck, DPM

## 2021-10-19 LAB — WOUND CULTURE
MICRO NUMBER:: 12560263
SPECIMEN QUALITY:: ADEQUATE

## 2021-10-20 ENCOUNTER — Other Ambulatory Visit: Payer: Self-pay | Admitting: Podiatry

## 2021-10-20 MED ORDER — CIPROFLOXACIN HCL 500 MG PO TABS: 500.0000 mg | ORAL_TABLET | Freq: Two times a day (BID) | ORAL | 0 refills | Status: AC

## 2021-10-22 ENCOUNTER — Other Ambulatory Visit: Payer: Self-pay

## 2021-10-22 ENCOUNTER — Encounter: Payer: Self-pay | Admitting: Internal Medicine

## 2021-10-22 ENCOUNTER — Ambulatory Visit (INDEPENDENT_AMBULATORY_CARE_PROVIDER_SITE_OTHER): Payer: Medicare HMO | Admitting: Internal Medicine

## 2021-10-22 VITALS — BP 120/72 | HR 77 | Ht 69.0 in | Wt 166.0 lb

## 2021-10-22 DIAGNOSIS — Z23 Encounter for immunization: Secondary | ICD-10-CM | POA: Diagnosis not present

## 2021-10-22 DIAGNOSIS — Z794 Long term (current) use of insulin: Secondary | ICD-10-CM

## 2021-10-22 DIAGNOSIS — E1142 Type 2 diabetes mellitus with diabetic polyneuropathy: Secondary | ICD-10-CM

## 2021-10-22 LAB — POCT GLYCOSYLATED HEMOGLOBIN (HGB A1C): Hemoglobin A1C: 7.2 % — AB (ref 4.0–5.6)

## 2021-10-22 MED ORDER — DAPAGLIFLOZIN PROPANEDIOL 10 MG PO TABS: 10.0000 mg | ORAL_TABLET | Freq: Every day | ORAL | 6 refills | Status: DC

## 2021-10-22 NOTE — Patient Instructions (Signed)
-   Continue Basaglar 55 units  - Continue Humalog 25 units with Breakfast and Lunch  - Continue Humalog  25-30 units with Supper  - Increase  Farxiga 10 mg, 1 tablet daily   HOW TO TREAT LOW BLOOD SUGARS (Blood sugar LESS THAN 70 MG/DL) Please follow the RULE OF 15 for the treatment of hypoglycemia treatment (when your (blood sugars are less than 70 mg/dL)   STEP 1: Take 15 grams of carbohydrates when your blood sugar is low, which includes:  3-4 GLUCOSE TABS  OR 3-4 OZ OF JUICE OR REGULAR SODA OR ONE TUBE OF GLUCOSE GEL    STEP 2: RECHECK blood sugar in 15 MINUTES STEP 3: If your blood sugar is still low at the 15 minute recheck --> then, go back to STEP 1 and treat AGAIN with another 15 grams of carbohydrates.

## 2021-10-22 NOTE — Progress Notes (Signed)
Name: Joseph Dixon  Age/ Sex: 70 y.o., male   MRN/ DOB: [id number], [birth year]     PCP: Elby Showers, MD   Reason for Endocrinology Evaluation: Type 2 Diabetes Mellitus  Initial Endocrine Consultative Visit: 11/29/2018    PATIENT IDENTIFIER: Joseph Dixon is a 70 y.o. male with a past medical history of DM, Oartic insufficiency and hyperlipidemia . The patient has followed with Endocrinology clinic since 11/29/2018 for consultative assistance with management of his diabetes.  DIABETIC HISTORY:  Mr. Lenn was diagnosed with T2DM ~ 2009, he does not recall any anti-glycemic agents he has been on, he has been on insulin for many years.  His hemoglobin A1c has ranged from 8.0% in 2019, peaking at 12.4% in 2016.   Pt with chronic intermittent Hx of diarrhea. That at times he attributes to Hx  ETOH intake.   Farxiga started 05/2021  SUBJECTIVE:   During the last visit (06/18/2021): A1c 6.9 %. We adjusted MDI regimen. Started Wilder Glade     Today (10/22/2021): [name] is here for a 4 month follow up on his diabetes management.  He checks his blood sugars 3 times daily. The patient has had hypoglycemic episodes since the last clinic visit. He has been having occasional bedtime snacks.    Denies  nausea , vomiting or diarrhea  Denies UTI's  Patient has been following up with [facility] for a right great toe ulcer     HOME DIABETES REGIMEN:  Basaglar 55 units daily  Humalog  25 units with breakfast and Lunch and 25-30 units with Supper Farxiga 5 mg daily        GLUCOSE LOG: Fasting  84- 455 mg/dL    DIABETIC COMPLICATIONS: Microvascular complications:  Neuropathy  Denies: retinopathy, nephropathy  Last eye exam: Completed 05/2019   Macrovascular complications:  Denies: CAD, PVD, CVA     HISTORY:  Past Medical History:  Past Medical History:  Diagnosis Date   Cancer ([place])    basal cell both shoulders   Diabetes mellitus    insulin dependant    Hyperlipidemia    Past Surgical History:  Past Surgical History:  Procedure Laterality Date   repair of rt knee laceration     Social History:  reports that he has quit smoking. He has never used smokeless tobacco. He reports that he does not drink alcohol and does not use drugs. Family History:  Family History  Problem Relation Age of Onset   Heart disease Father    Diabetes Father    Mental illness Father    Alzheimer's disease Father    COPD Brother    Diabetes Mother    Alzheimer's disease Mother    Colon cancer Neg Hx      HOME MEDICATIONS: Allergies as of 10/22/2021       Reactions   Sulfa Antibiotics    hallucinations        Medication List        Accurate as of October 22, 2021 11:57 AM. If you have any questions, ask your nurse or doctor.          ALPRAZolam 0.25 MG tablet Commonly known as: XANAX TAKE 1 TABLET BY MOUTH EVERYDAY AT BEDTIME   aspirin EC 81 MG tablet Take 1 tablet (81 mg total) by mouth daily.   Basaglar KwikPen 100 UNIT/ML Inject 55 Units into the skin daily.   BD Pen Needle Nano 2nd Gen 32G X 4 MM Misc  Generic drug: Insulin Pen Needle USE 1 PEN NEEDLE DAILY   blood glucose meter kit and supplies Kit Dispense based on patient and insurance preference. ICD-10: E11.9   cephALEXin 500 MG capsule Commonly known as: KEFLEX Take 1 capsule (500 mg total) by mouth 4 (four) times daily for 10 days.   ciprofloxacin 500 MG tablet Commonly known as: Cipro Take 1 tablet (500 mg total) by mouth 2 (two) times daily for 10 days.   dapagliflozin propanediol 10 MG Tabs tablet Commonly known as: Farxiga Take 1 tablet (10 mg total) by mouth daily before breakfast. What changed:  medication strength how much to take Changed by: Dorita Sciara, MD   glucose blood test strip Use as instructed to check blood sugar before meals and at bedtime What changed:  how much to take how to take this when to take this   HumaLOG KwikPen 200  UNIT/ML KwikPen Generic drug: insulin lispro Max daily 90 units   hydrochlorothiazide 25 MG tablet Commonly known as: HYDRODIURIL TAKE 1 TABLET BY MOUTH EVERY DAY. NEED OFFICE VISIT   losartan 100 MG tablet Commonly known as: COZAAR TAKE 1 TABLET BY MOUTH EVERY DAY   metoCLOPramide 5 MG tablet Commonly known as: REGLAN TAKE 1 TABLET BY MOUTH 3 TIMES A DAY BEFORE MEALS   onetouch ultrasoft lancets Use as instructed   pantoprazole 40 MG tablet Commonly known as: PROTONIX TAKE 1 TABLET BY MOUTH EVERY DAY   rosuvastatin 20 MG tablet Commonly known as: CRESTOR TAKE 1 TABLET BY MOUTH EVERY DAY   Santyl ointment Generic drug: collagenase Apply 1 application topically daily.   sildenafil 100 MG tablet Commonly known as: Viagra TAKE 1 TABLET 1 HOUR BEFORE INTERCOURSE        PHYSICAL EXAM: VS: BP 120/72 (BP Location: Left Arm, Patient Position: Sitting, Cuff Size: Small)   Pulse 77   Ht 5' 9"  (1.753 m)   Wt 166 lb (75.3 kg)   SpO2 98%   BMI 24.51 kg/m    EXAM: General: Pt appears well and is in NAD  Lungs: Clear with good BS bilat with no rales, rhonchi, or wheezes  Heart: Auscultation: RRR with normal S1 and S2   Extremities:  BL LE: no pretibial edema  Right great toe bandage present, with slight swelling at the base of the right toe Normal exam on left  Mental Status: Judgment, insight: intact Orientation: oriented to time, place, and person Mood and affect: no depression, anxiety, or agitation     DM Foot Exam  06/18/2021  The skin of the feet is  without sores or ulcerations but has bilateral heel callous formation  The pedal pulses are decreased bilaterally  The sensation is decreased to a screening 5.07, 10 gram monofilament bilaterally    DATA REVIEWED:  Lab Results  Component Value Date   HGBA1C 7.2 (A) 10/22/2021   HGBA1C 7.6 (H) 07/29/2021   HGBA1C 7.5 (A) 06/18/2021   Lab Results  Component Value Date   MICROALBUR 0.2 07/29/2021    LDLCALC 81 07/29/2021   CREATININE 0.90 07/29/2021   Lab Results  Component Value Date   MICRALBCREAT 4 07/29/2021     Lab Results  Component Value Date   CHOL 156 07/29/2021   HDL 47 07/29/2021   LDLCALC 81 07/29/2021   TRIG 185 (H) 07/29/2021   CHOLHDL 3.3 07/29/2021      Results for [name], [name] (MRN [id number]) as of 10/22/2021 10:01  Ref. Range 07/29/2021 09:26  Sodium Latest Ref Range: 135 - 146 mmol/L 137  Potassium Latest Ref Range: 3.5 - 5.3 mmol/L 3.5  Chloride Latest Ref Range: 98 - 110 mmol/L 98  CO2 Latest Ref Range: 20 - 32 mmol/L 30  Glucose Latest Ref Range: 65 - 99 mg/dL 140 (H)  Mean Plasma Glucose Latest Units: mg/dL 171  BUN Latest Ref Range: 7 - 25 mg/dL 19  Creatinine Latest Ref Range: 0.70 - 1.35 mg/dL 0.90  Calcium Latest Ref Range: 8.6 - 10.3 mg/dL 9.5  BUN/Creatinine Ratio Latest Ref Range: 6 - 22 (calc) NOT APPLICABLE  eGFR Latest Ref Range: > OR = 60 mL/min/1.60m 93  AG Ratio Latest Ref Range: 1.0 - 2.5 (calc) 1.8  AST Latest Ref Range: 10 - 35 U/L 16  ALT Latest Ref Range: 9 - 46 U/L 20  Total Protein Latest Ref Range: 6.1 - 8.1 g/dL 6.6  Total Bilirubin Latest Ref Range: 0.2 - 1.2 mg/dL 0.4    ASSESSMENT / PLAN / RECOMMENDATIONS:   1) Type 2 Diabetes Mellitus, Optimally controlled, With neuropathic complications - Most recent A1c of 7.2 %. Goal A1c < 7.0 %.   -Praised the pt on continued improvement of glycemic control  -We had discussed CGM technology in the past but he is not keen on it -Has chronic history of GI issues, and he would like to avoid any medications that we will cause GI side effects.  Not a candidate for GLP-1 agonist nor metformin -He is tolerating FIranwithout side effects, will increase as below, the cost was $47 a month, he was provided with patient assistance papers for FIrantoday   MEDICATIONS:   Continue Basaglar 55 units daily  Continue Humalog  25 units with Breakfast and Lunch and between 25-30  units with supper Increase Farxiga 10 mg, 1 tablet daily   EDUCATION / INSTRUCTIONS: BG monitoring instructions: Patient is instructed to check his blood sugars 4 times a day, before meals and bedtime [facility] if: BG persistently < 70  I reviewed the Rule of 15 for the treatment of hypoglycemia in detail with the patient. Literature supplied.  2) Diabetic complications:  Eye: Does not have known diabetic retinopathy.  Neuro/ Feet: Does have known diabetic peripheral neuropathy. Renal: Patient does not have known baseline CKD. He is on an ACEI/ARB at present.           F/U 6 months   Signed electronically by: AMack Guise MD  [facility]  CMonaGroup [address], SMount EatonGDargan [place] [address]Phone: [phone]: [phone]  CC: BElby Showers MD [id number]: [phone] Fax: [phone] Return to Endocrinology clinic as below: Future Appointments  Date Time Provider DArthur 11/05/2021 10:45 AM SLorenda Peck MD [facility]  04/23/2022 11:10 AM , IMelanie Crazier MD [facility] None

## 2021-10-31 ENCOUNTER — Telehealth: Payer: Self-pay

## 2021-10-31 NOTE — Telephone Encounter (Signed)
Received message patient is overdue for losartan refill as of 10/22/21. He states that he has about 25 left before he needs a refill. He is using a pill box now to make sure he is taking his meds every day and he tries to remember it daily. I explained it was important that this was taken daily and he said he would work on it.

## 2021-10-31 NOTE — Telephone Encounter (Signed)
Pt last filled losartan #90 in July and pass due for refill from pharmacy. LMOM for pt to call to make sure he wasn't having a hard time getting his medication.

## 2021-11-05 ENCOUNTER — Ambulatory Visit (INDEPENDENT_AMBULATORY_CARE_PROVIDER_SITE_OTHER): Payer: Medicare HMO

## 2021-11-05 ENCOUNTER — Encounter: Payer: Self-pay | Admitting: Podiatry

## 2021-11-05 ENCOUNTER — Ambulatory Visit: Payer: Medicare HMO | Admitting: Podiatry

## 2021-11-05 ENCOUNTER — Other Ambulatory Visit: Payer: Self-pay

## 2021-11-05 DIAGNOSIS — Z794 Long term (current) use of insulin: Secondary | ICD-10-CM

## 2021-11-05 DIAGNOSIS — L97514 Non-pressure chronic ulcer of other part of right foot with necrosis of bone: Secondary | ICD-10-CM

## 2021-11-05 DIAGNOSIS — L97519 Non-pressure chronic ulcer of other part of right foot with unspecified severity: Secondary | ICD-10-CM

## 2021-11-05 DIAGNOSIS — I739 Peripheral vascular disease, unspecified: Secondary | ICD-10-CM

## 2021-11-05 DIAGNOSIS — E1142 Type 2 diabetes mellitus with diabetic polyneuropathy: Secondary | ICD-10-CM

## 2021-11-05 MED ORDER — CIPROFLOXACIN HCL 500 MG PO TABS: 500.0000 mg | ORAL_TABLET | Freq: Two times a day (BID) | ORAL | 0 refills | Status: AC

## 2021-11-05 NOTE — Progress Notes (Signed)
  Subjective:  Patient ID: Joseph Dixon, male    DOB: May 28, 1951,   MRN: [id number]  Chief Complaint  Patient presents with   Foot Ulcer    I am not sure how it is doing on the right big toe and I very rarely wear the surgical shoe and use salve and band aids     70 y.o. male presents for follow-up of right great toe ucler . Relates he finished the first round of antibiotics but sounds like he never received the ciprofloxacin. States he has been using the santyl on the toe and dressing everyday. He states he feels like the wound is healing.   Denies any pain . Denies any other pedal complaints. Denies n/v/f/c.   Past Medical History:  Diagnosis Date   Cancer ([place])    basal cell both shoulders   Diabetes mellitus    insulin dependant   Hyperlipidemia     Objective:  Physical Exam: Vascular: DP/PT pulses 2/4 bilateral. CFT <3 seconds. Normal hair growth on digits. No edema.  Skin. No lacerations or abrasions bilateral feet. 3 cm x 2 cm x 0.5 cm with fibrotic base and hyperkeratotic rim. Some mild surrounding erythema and edema noted. No purulence. Positive probe to bone. Musculoskeletal: MMT 5/5 bilateral lower extremities in DF, PF, Inversion and Eversion. Deceased ROM in DF of ankle joint.  Neurological: Sensation intact to light touch.   Assessment:   1. 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 Type 2 diabetes mellitus with diabetic polyneuropathy, with long-term current use of insulin ([place])   3. PAD (peripheral artery disease) ([place])       Plan:  Patient was evaluated and treated and all questions answered. Ulcer right great toe  -Debridement as below. -X-rays reviewed. Possible area of erosion noted to distal tip of right phalanx concerning for osteomyelitis.  -MRI ordered to evaluate.  -Dressed with silvadene, DSD. -Continue with Santyl -Prescription for ciprofloxacin provided.  -Off-loading with surgical shoe. -Discussed glucose control and proper  protein-rich diet.  -Discussed if any worsening redness, pain, fever or chills to call or may need to report to the emergency room. Patient expressed understanding.   Procedure: Excisional Debridement of Wound Rationale: Removal of non-viable soft tissue from the wound to promote healing.  Anesthesia: none Pre-Debridement Wound Measurements: 2 cm x 2 cm x 0.2 cm  Post-Debridement Wound Measurements: 2 cm x 3 cm x 0.5 cm  Type of Debridement: Sharp Excisional Tissue Removed: Non-viable soft tissue Depth of Debridement: subcutaneous tissue. Technique: Sharp excisional debridement to bleeding, viable wound base.  Dressing: Dry, sterile, compression dressing. Disposition: Patient tolerated procedure well. Patient to return in 2 week for follow-up.  Return in about 1 week (around 11/12/2021) for wound check.   Lorenda Peck, DPM

## 2021-11-12 ENCOUNTER — Ambulatory Visit: Payer: Medicare HMO | Admitting: Podiatry

## 2021-11-19 ENCOUNTER — Ambulatory Visit: Payer: Medicare HMO | Admitting: Podiatry

## 2021-11-21 HISTORY — PX: OTHER SURGICAL HISTORY: SHX169

## 2021-11-27 ENCOUNTER — Telehealth: Payer: Self-pay | Admitting: Internal Medicine

## 2021-11-27 MED ORDER — ONETOUCH ULTRASOFT LANCETS MISC: 12 refills | Status: DC

## 2021-11-27 NOTE — Telephone Encounter (Signed)
PT stopped by requesting perscription for OneTouch Verio test strips and Lancets (ONETOUCH ULTRASOFT) lancets to be sent to  [id number] - [place], [place]. Phone:  [phone]  Fax:  [phone]

## 2021-11-27 NOTE — Telephone Encounter (Signed)
Script sent  

## 2021-11-30 ENCOUNTER — Other Ambulatory Visit: Payer: Self-pay

## 2021-11-30 ENCOUNTER — Ambulatory Visit
Admission: RE | Admit: 2021-11-30 | Discharge: 2021-11-30 | Disposition: A | Discharge status: Home / Self Care | Payer: Medicare HMO | Source: Ambulatory Visit | Attending: Podiatry | Admitting: Podiatry

## 2021-11-30 DIAGNOSIS — M19071 Primary osteoarthritis, right ankle and foot: Secondary | ICD-10-CM | POA: Diagnosis not present

## 2021-11-30 DIAGNOSIS — L97514 Non-pressure chronic ulcer of other part of right foot with necrosis of bone: Secondary | ICD-10-CM

## 2021-11-30 DIAGNOSIS — R600 Localized edema: Secondary | ICD-10-CM | POA: Diagnosis not present

## 2021-11-30 DIAGNOSIS — Z87828 Personal history of other (healed) physical injury and trauma: Secondary | ICD-10-CM | POA: Diagnosis not present

## 2021-11-30 DIAGNOSIS — M86171 Other acute osteomyelitis, right ankle and foot: Secondary | ICD-10-CM | POA: Diagnosis not present

## 2021-12-01 ENCOUNTER — Telehealth: Payer: Self-pay | Admitting: *Deleted

## 2021-12-01 NOTE — Telephone Encounter (Signed)
[facility] is calling w/ report (MRI), it is in epic and ready for review.

## 2021-12-03 ENCOUNTER — Ambulatory Visit (INDEPENDENT_AMBULATORY_CARE_PROVIDER_SITE_OTHER): Payer: Medicare HMO | Admitting: Podiatry

## 2021-12-03 ENCOUNTER — Telehealth: Payer: Self-pay

## 2021-12-03 ENCOUNTER — Encounter: Payer: Self-pay | Admitting: Podiatry

## 2021-12-03 ENCOUNTER — Other Ambulatory Visit: Payer: Self-pay

## 2021-12-03 ENCOUNTER — Telehealth: Payer: Self-pay | Admitting: Urology

## 2021-12-03 DIAGNOSIS — E1142 Type 2 diabetes mellitus with diabetic polyneuropathy: Secondary | ICD-10-CM

## 2021-12-03 DIAGNOSIS — Z794 Long term (current) use of insulin: Secondary | ICD-10-CM

## 2021-12-03 DIAGNOSIS — M869 Osteomyelitis, unspecified: Secondary | ICD-10-CM

## 2021-12-03 NOTE — Telephone Encounter (Signed)
AZ&ME application faxed and confirmed receipt @ 9:41am.

## 2021-12-03 NOTE — Progress Notes (Signed)
°  Subjective:  Patient ID: Joseph Dixon, male    DOB: 11/26/1951,   MRN: [id number]  Chief Complaint  Patient presents with   Wound Check    F/U Rt hallux toe check -pt states," it's about healed." - no redness - less swelling -no draiange -Tx: santly, bandaid daily      70 y.o. male presents for follow-up of right great toe ucler . Continuing antibiotics. Relates it is about healed. Here to discuss MRI.  States he has been using the santyl on the toe and dressing everyday.  Denies any pain . Denies any other pedal complaints. Denies n/v/f/c.   Past Medical History:  Diagnosis Date   Cancer ([place])    basal cell both shoulders   Diabetes mellitus    insulin dependant   Hyperlipidemia     Objective:  Physical Exam: Vascular: DP/PT pulses 2/4 bilateral. CFT <3 seconds. Normal hair growth on digits. No edema.  Skin. No lacerations or abrasions bilateral feet. Callus noted to distal hallux. Some mild surrounding erythema and edema noted. No purulence. Positive probe to bone.  Musculoskeletal: MMT 5/5 bilateral lower extremities in DF, PF, Inversion and Eversion. Deceased ROM in DF of ankle joint.  Neurological: Sensation intact to light touch.   Assessment:   1. Type 2 diabetes mellitus with diabetic polyneuropathy, with long-term current use of insulin ([place])   2. Osteomyelitis of great toe of right foot ([place])        Plan:  Patient was evaluated and treated and all questions answered. Ulcer right great toe overylying callus  -No debridement necessary -X-rays reviewed. Possible area of erosion noted to distal tip of right phalanx concerning for osteomyelitis.  MRI reviewed and reveals osteomyelitis in distal phalanx of right great toe and some early changes in the distal proximal phalanx.   -Dressed silavedene and banaid.  -Continue with dressing.  -Continue with ciprofloxacin -Off-loading with surgical shoe. -Discussed glucose control and proper protein-rich diet.   -Discussed if any worsening redness, pain, fever or chills to call or may need to report to the emergency room. Patient expressed understanding.  -Informed surgical risk consent was reviewed and read aloud to the patient.  I reviewed the films.  I have discussed my findings with the patient in great detail.  I have discussed all risks including but not limited to infection, stiffness, scarring, limp, disability, deformity, damage to blood vessels and nerves, numbness, poor healing, need for braces, arthritis, chronic pain, amputation, death.  All benefits and realistic expectations discussed in great detail.  I have made no promises as to the outcome.  I have provided realistic expectations.  I have offered the patient a 2nd opinion, which they have declined and assured me they preferred to proceed despite the risks. -Patient will be schedule for surgery 12/20 for partial right hallux amputation.      No follow-ups on file.   Lorenda Peck, DPM

## 2021-12-03 NOTE — Telephone Encounter (Signed)
DOS - 12/09/21  RIGHT AMPUTATION --- 19597  [employer] EFFECTIVE DATE - 12/21/20  SPOKE WITH SKY C. WITH [employer] AND SHE STATED THAT FOR CPT CODE 47185 NO PRIOR AUTH IS REQUIRED.   REF # [id number]

## 2021-12-05 ENCOUNTER — Telehealth: Payer: Self-pay

## 2021-12-05 ENCOUNTER — Other Ambulatory Visit: Payer: Self-pay | Admitting: Internal Medicine

## 2021-12-05 MED ORDER — DAPAGLIFLOZIN PROPANEDIOL 10 MG PO TABS: 10.0000 mg | ORAL_TABLET | Freq: Every day | ORAL | 6 refills | Status: DC

## 2021-12-05 NOTE — Telephone Encounter (Signed)
Patient AZ&ME application was not approved because he exceeds income limits. Please advise on any medication changes.

## 2021-12-05 NOTE — Telephone Encounter (Signed)
Patient has been notified and states he will get Wilder Glade for now and will let us know if he can't  afford .

## 2021-12-09 ENCOUNTER — Encounter: Payer: Self-pay | Admitting: Podiatry

## 2021-12-09 ENCOUNTER — Other Ambulatory Visit: Payer: Self-pay | Admitting: Podiatry

## 2021-12-09 DIAGNOSIS — M86671 Other chronic osteomyelitis, right ankle and foot: Secondary | ICD-10-CM | POA: Diagnosis not present

## 2021-12-09 DIAGNOSIS — M86171 Other acute osteomyelitis, right ankle and foot: Secondary | ICD-10-CM | POA: Diagnosis not present

## 2021-12-09 MED ORDER — OXYCODONE HCL 5 MG PO CAPS: 5.0000 mg | ORAL_CAPSULE | ORAL | 0 refills | Status: AC | PRN

## 2021-12-17 ENCOUNTER — Other Ambulatory Visit: Payer: Self-pay

## 2021-12-17 ENCOUNTER — Ambulatory Visit (INDEPENDENT_AMBULATORY_CARE_PROVIDER_SITE_OTHER): Payer: Medicare HMO | Admitting: Podiatry

## 2021-12-17 ENCOUNTER — Encounter: Payer: Self-pay | Admitting: Podiatry

## 2021-12-17 ENCOUNTER — Ambulatory Visit (INDEPENDENT_AMBULATORY_CARE_PROVIDER_SITE_OTHER): Payer: Medicare HMO

## 2021-12-17 DIAGNOSIS — M86171 Other acute osteomyelitis, right ankle and foot: Secondary | ICD-10-CM | POA: Diagnosis not present

## 2021-12-17 DIAGNOSIS — Z9889 Other specified postprocedural states: Secondary | ICD-10-CM

## 2021-12-17 DIAGNOSIS — M869 Osteomyelitis, unspecified: Secondary | ICD-10-CM

## 2021-12-17 MED ORDER — GABAPENTIN 100 MG PO CAPS: 100.0000 mg | ORAL_CAPSULE | Freq: Three times a day (TID) | ORAL | 3 refills | Status: DC

## 2021-12-17 NOTE — Progress Notes (Signed)
Subjective:  Patient ID: Joseph Dixon, male    DOB: [birth year],  MRN: [id number]  Chief Complaint  Patient presents with   Routine Post Op    POV #1 -pt deneis N/V/f/Ch -dressing intact -pt states," no pain from the wound but stabbing pain through the whole foot; 10/10." - Tx; sx shoe and elevation     DOS: 12/09/21 Procedure: Right partial hallux amputation   70 y.o. male returns for POV#1. Patient is doing well not having too much pain at the amputation site. Does complain of burning and tingling at night that has worsened. Has been WBAT in surgical shoe and has kept the dressing dry. Denies n/v/f/c.   Review of Systems: Negative except as noted in the HPI. Denies N/V/F/Ch.  Past Medical History:  Diagnosis Date   Cancer ([place])    basal cell both shoulders   Diabetes mellitus    insulin dependant   Hyperlipidemia     Current Outpatient Medications:    gabapentin (NEURONTIN) 100 MG capsule, Take 1 capsule (100 mg total) by mouth 3 (three) times daily., Disp: 90 capsule, Rfl: 3   ALPRAZolam (XANAX) 0.25 MG tablet, TAKE 1 TABLET BY MOUTH EVERYDAY AT BEDTIME, Disp: 90 tablet, Rfl: 0   aspirin EC 81 MG tablet, Take 1 tablet (81 mg total) by mouth daily., Disp: 90 tablet, Rfl: 3   BD PEN NEEDLE NANO 2ND GEN 32G X 4 MM MISC, USE 1 PEN NEEDLE DAILY, Disp: 100 each, Rfl: 2   blood glucose meter kit and supplies KIT, Dispense based on patient and insurance preference. ICD-10: E11.9, Disp: 1 each, Rfl: 0   collagenase (SANTYL) ointment, Apply 1 application topically daily., Disp: 15 g, Rfl: 0   dapagliflozin propanediol (FARXIGA) 10 MG TABS tablet, Take 1 tablet (10 mg total) by mouth daily before breakfast., Disp: 30 tablet, Rfl: 6   glucose blood test strip, Use as instructed to check blood sugar before meals and at bedtime (Patient taking differently: 1 each by Other route 2 (two) times daily. Use as instructed to check blood sugar before meals and at bedtime), Disp: 200 each, Rfl: 12    hydrochlorothiazide (HYDRODIURIL) 25 MG tablet, TAKE 1 TABLET BY MOUTH EVERY DAY. NEED OFFICE VISIT, Disp: 90 tablet, Rfl: 1   Insulin Glargine (BASAGLAR KWIKPEN) 100 UNIT/ML, Inject 55 Units into the skin daily., Disp: 60 mL, Rfl: 3   insulin lispro (HUMALOG KWIKPEN) 200 UNIT/ML KwikPen, Max daily 90 units, Disp: 45 mL, Rfl: 3   Lancets (ONETOUCH ULTRASOFT) lancets, Use as instructed, Disp: 200 each, Rfl: 12   losartan (COZAAR) 100 MG tablet, TAKE 1 TABLET BY MOUTH EVERY DAY, Disp: 90 tablet, Rfl: 1   metoCLOPramide (REGLAN) 5 MG tablet, TAKE 1 TABLET BY MOUTH 3 TIMES A DAY BEFORE MEALS, Disp: 270 tablet, Rfl: 1   pantoprazole (PROTONIX) 40 MG tablet, TAKE 1 TABLET BY MOUTH EVERY DAY, Disp: 90 tablet, Rfl: 3   rosuvastatin (CRESTOR) 20 MG tablet, TAKE 1 TABLET BY MOUTH EVERY DAY, Disp: 90 tablet, Rfl: 1   sildenafil (VIAGRA) 100 MG tablet, TAKE 1 TABLET 1 HOUR BEFORE INTERCOURSE, Disp: 4 tablet, Rfl: 11  Social History   Tobacco Use  Smoking Status Former  Smokeless Tobacco Never  Tobacco Comments   quit 10 years ago    Allergies  Allergen Reactions   Sulfa Antibiotics     hallucinations   Objective:  There were no vitals filed for this visit. There is no height or weight  on file to calculate BMI. Constitutional Well developed. Well nourished.  Vascular Foot warm and well perfused. Capillary refill normal to all digits.   Neurologic Normal speech. Oriented to person, place, and time. Epicritic sensation to light touch grossly present bilaterally.  Dermatologic Skin healing well without signs of infection. Skin edges well coapted without signs of infection.  Orthopedic: Tenderness to palpation noted about the surgical site.   Radiographs: Distal right hallux amputation site no osseous erosions noted.  Assessment:   1. Osteomyelitis of great toe of right foot ([place])   2. Post-operative state    Plan:  Patient was evaluated and treated and all questions answered.  S/p foot  surgery right  -Progressing as expected post-operatively. -WB Status: WBAT in surgical shoe   -Sutures: intact . -Medications: Gabapentin 300 mg nightly for nerve pain  -Foot redressed. Will follow-up in 2 weeks for suture removal.   Return in about 2 weeks (around 12/31/2021) for post op.

## 2021-12-22 ENCOUNTER — Other Ambulatory Visit: Payer: Self-pay | Admitting: Internal Medicine

## 2021-12-31 ENCOUNTER — Ambulatory Visit (INDEPENDENT_AMBULATORY_CARE_PROVIDER_SITE_OTHER): Payer: Medicare HMO | Admitting: Podiatry

## 2021-12-31 ENCOUNTER — Other Ambulatory Visit: Payer: Self-pay

## 2021-12-31 ENCOUNTER — Encounter: Payer: Self-pay | Admitting: Podiatry

## 2021-12-31 DIAGNOSIS — E1142 Type 2 diabetes mellitus with diabetic polyneuropathy: Secondary | ICD-10-CM

## 2021-12-31 DIAGNOSIS — Z9889 Other specified postprocedural states: Secondary | ICD-10-CM

## 2021-12-31 DIAGNOSIS — M869 Osteomyelitis, unspecified: Secondary | ICD-10-CM

## 2021-12-31 DIAGNOSIS — Z794 Long term (current) use of insulin: Secondary | ICD-10-CM

## 2021-12-31 NOTE — Progress Notes (Signed)
Subjective:  Patient ID: Joseph Dixon, male    DOB: 09/20/1951,  MRN: [id number]  Chief Complaint  Patient presents with   Routine Post Op    POV#2 -pt had N&V Jan 1-2nd - dressing intact tx: sx shoe -pt states," feeling fine no issues."     DOS: 12/09/21 Procedure: Right partial hallux amputation   71 y.o. male returns for POV#2 . Patient is doing well not having too much pain at the amputation site.  Has been WBAT in surgical shoe and has kept the dressing dry. Denies n/v/f/c.   Review of Systems: Negative except as noted in the HPI. Denies N/V/F/Ch.  Past Medical History:  Diagnosis Date   Cancer ([place])    basal cell both shoulders   Diabetes mellitus    insulin dependant   Hyperlipidemia     Current Outpatient Medications:    ALPRAZolam (XANAX) 0.25 MG tablet, TAKE 1 TABLET BY MOUTH EVERYDAY AT BEDTIME, Disp: 90 tablet, Rfl: 0   aspirin EC 81 MG tablet, Take 1 tablet (81 mg total) by mouth daily., Disp: 90 tablet, Rfl: 3   BD PEN NEEDLE NANO 2ND GEN 32G X 4 MM MISC, USE 1 PEN NEEDLE DAILY, Disp: 100 each, Rfl: 2   blood glucose meter kit and supplies KIT, Dispense based on patient and insurance preference. ICD-10: E11.9, Disp: 1 each, Rfl: 0   collagenase (SANTYL) ointment, Apply 1 application topically daily., Disp: 15 g, Rfl: 0   dapagliflozin propanediol (FARXIGA) 10 MG TABS tablet, Take 1 tablet (10 mg total) by mouth daily before breakfast., Disp: 30 tablet, Rfl: 6   gabapentin (NEURONTIN) 100 MG capsule, Take 1 capsule (100 mg total) by mouth 3 (three) times daily., Disp: 90 capsule, Rfl: 3   glucose blood test strip, Use as instructed to check blood sugar before meals and at bedtime (Patient taking differently: 1 each by Other route 2 (two) times daily. Use as instructed to check blood sugar before meals and at bedtime), Disp: 200 each, Rfl: 12   hydrochlorothiazide (HYDRODIURIL) 25 MG tablet, TAKE 1 TABLET BY MOUTH EVERY DAY. NEED OFFICE VISIT, Disp: 90 tablet, Rfl: 1    Insulin Glargine (BASAGLAR KWIKPEN) 100 UNIT/ML, Inject 55 Units into the skin daily., Disp: 60 mL, Rfl: 3   insulin lispro (HUMALOG KWIKPEN) 200 UNIT/ML KwikPen, Max daily 90 units, Disp: 45 mL, Rfl: 3   Lancets (ONETOUCH ULTRASOFT) lancets, Use as instructed, Disp: 200 each, Rfl: 12   losartan (COZAAR) 100 MG tablet, TAKE 1 TABLET BY MOUTH EVERY DAY, Disp: 90 tablet, Rfl: 1   metoCLOPramide (REGLAN) 5 MG tablet, TAKE 1 TABLET BY MOUTH 3 TIMES A DAY BEFORE MEALS, Disp: 270 tablet, Rfl: 1   pantoprazole (PROTONIX) 40 MG tablet, TAKE 1 TABLET BY MOUTH EVERY DAY, Disp: 90 tablet, Rfl: 3   rosuvastatin (CRESTOR) 20 MG tablet, TAKE 1 TABLET BY MOUTH EVERY DAY, Disp: 90 tablet, Rfl: 1   sildenafil (VIAGRA) 100 MG tablet, TAKE 1 TABLET 1 HOUR BEFORE INTERCOURSE, Disp: 4 tablet, Rfl: 11  Social History   Tobacco Use  Smoking Status Former  Smokeless Tobacco Never  Tobacco Comments   quit 10 years ago    Allergies  Allergen Reactions   Sulfa Antibiotics     hallucinations   Objective:  There were no vitals filed for this visit. There is no height or weight on file to calculate BMI. Constitutional Well developed. Well nourished.  Vascular Foot warm and well perfused. Capillary refill normal to  all digits.   Neurologic Normal speech. Oriented to person, place, and time. Epicritic sensation to light touch grossly present bilaterally.  Dermatologic Skin healing well without signs of infection. Skin edges well coapted without signs of infection.  Orthopedic: Tenderness to palpation noted about the surgical site.   Radiographs: Distal right hallux amputation site no osseous erosions noted.  Assessment:   1. Post-operative state   2. Type 2 diabetes mellitus with diabetic polyneuropathy, with long-term current use of insulin ([place])   3. Osteomyelitis of great toe of right foot ([place])     Plan:  Patient was evaluated and treated and all questions answered.  S/p foot surgery right   -Progressing as expected post-operatively. -WB Status: WBAT iin regular shoe   -Sutures: removed without incident.  -Foot redressed. Will follow-up in 3 weeks.   No follow-ups on file.

## 2022-01-14 ENCOUNTER — Telehealth: Payer: Self-pay

## 2022-01-14 NOTE — Telephone Encounter (Signed)
Lmtcb. Patient needs to fill out new [facility] application for Humalog to continue receiving medication after 03/13/22.

## 2022-01-21 ENCOUNTER — Ambulatory Visit (INDEPENDENT_AMBULATORY_CARE_PROVIDER_SITE_OTHER): Payer: Medicare HMO | Admitting: Podiatry

## 2022-01-21 ENCOUNTER — Other Ambulatory Visit: Payer: Self-pay

## 2022-01-21 ENCOUNTER — Encounter: Payer: Self-pay | Admitting: Podiatry

## 2022-01-21 DIAGNOSIS — Z9889 Other specified postprocedural states: Secondary | ICD-10-CM

## 2022-01-21 NOTE — Progress Notes (Signed)
Subjective:  Patient ID: Joseph Dixon, male    DOB: Feb 22, 1951,  MRN: [id number]  Chief Complaint  Patient presents with   Routine Post Op     POV #3 DOS 12/09/21 RIGHT PARTIAL HALLUX AMPUTATION   "Doing fin, no pain, feeling very well"    DOS: 12/09/21 Procedure: Right partial hallux amputation   71 y.o. male returns for POV#3. Patient is doing well not having too much pain at the amputation site. Has been in regular shoe with no issues.    Review of Systems: Negative except as noted in the HPI. Denies N/V/F/Ch.  Past Medical History:  Diagnosis Date   Cancer ([place])    basal cell both shoulders   Diabetes mellitus    insulin dependant   Hyperlipidemia     Current Outpatient Medications:    ALPRAZolam (XANAX) 0.25 MG tablet, TAKE 1 TABLET BY MOUTH EVERYDAY AT BEDTIME, Disp: 90 tablet, Rfl: 0   aspirin EC 81 MG tablet, Take 1 tablet (81 mg total) by mouth daily., Disp: 90 tablet, Rfl: 3   BD PEN NEEDLE NANO 2ND GEN 32G X 4 MM MISC, USE 1 PEN NEEDLE DAILY, Disp: 100 each, Rfl: 2   blood glucose meter kit and supplies KIT, Dispense based on patient and insurance preference. ICD-10: E11.9, Disp: 1 each, Rfl: 0   collagenase (SANTYL) ointment, Apply 1 application topically daily., Disp: 15 g, Rfl: 0   dapagliflozin propanediol (FARXIGA) 10 MG TABS tablet, Take 1 tablet (10 mg total) by mouth daily before breakfast., Disp: 30 tablet, Rfl: 6   gabapentin (NEURONTIN) 100 MG capsule, Take 1 capsule (100 mg total) by mouth 3 (three) times daily., Disp: 90 capsule, Rfl: 3   glucose blood test strip, Use as instructed to check blood sugar before meals and at bedtime (Patient taking differently: 1 each by Other route 2 (two) times daily. Use as instructed to check blood sugar before meals and at bedtime), Disp: 200 each, Rfl: 12   hydrochlorothiazide (HYDRODIURIL) 25 MG tablet, TAKE 1 TABLET BY MOUTH EVERY DAY. NEED OFFICE VISIT, Disp: 90 tablet, Rfl: 1   Insulin Glargine (BASAGLAR KWIKPEN)  100 UNIT/ML, Inject 55 Units into the skin daily., Disp: 60 mL, Rfl: 3   insulin lispro (HUMALOG KWIKPEN) 200 UNIT/ML KwikPen, Max daily 90 units, Disp: 45 mL, Rfl: 3   Lancets (ONETOUCH ULTRASOFT) lancets, Use as instructed, Disp: 200 each, Rfl: 12   losartan (COZAAR) 100 MG tablet, TAKE 1 TABLET BY MOUTH EVERY DAY, Disp: 90 tablet, Rfl: 1   metoCLOPramide (REGLAN) 5 MG tablet, TAKE 1 TABLET BY MOUTH 3 TIMES A DAY BEFORE MEALS, Disp: 270 tablet, Rfl: 1   pantoprazole (PROTONIX) 40 MG tablet, TAKE 1 TABLET BY MOUTH EVERY DAY, Disp: 90 tablet, Rfl: 3   rosuvastatin (CRESTOR) 20 MG tablet, TAKE 1 TABLET BY MOUTH EVERY DAY, Disp: 90 tablet, Rfl: 1   sildenafil (VIAGRA) 100 MG tablet, TAKE 1 TABLET 1 HOUR BEFORE INTERCOURSE, Disp: 4 tablet, Rfl: 11  Social History   Tobacco Use  Smoking Status Former  Smokeless Tobacco Never  Tobacco Comments   quit 10 years ago    Allergies  Allergen Reactions   Sulfa Antibiotics     hallucinations   Objective:  There were no vitals filed for this visit. There is no height or weight on file to calculate BMI. Constitutional Well developed. Well nourished.  Vascular Foot warm and well perfused. Capillary refill normal to all digits.   Neurologic Normal  speech. Oriented to person, place, and time. Epicritic sensation to light touch grossly present bilaterally.  Dermatologic Skin healing well without signs of infection. Skin edges well coapted without signs of infection.  Orthopedic: Tenderness to palpation noted about the surgical site.   Radiographs: Distal right hallux amputation site no osseous erosions noted.  Assessment:   1. Post-operative state     Plan:  Patient was evaluated and treated and all questions answered.  S/p foot surgery right  -Progressing as expected post-operatively. -WB Status: WBAT iin regular shoe   Will follow-up 6 months for diabetic foot check.    Return in about 6 months (around 07/21/2022) for diabetic foot  check.

## 2022-02-05 ENCOUNTER — Other Ambulatory Visit: Payer: Self-pay | Admitting: Internal Medicine

## 2022-03-20 NOTE — Telephone Encounter (Signed)
Patient dropped off patient assistance paperwork.  This was placed in the providers in box at the front desk.  Company is - [employer] ? ?

## 2022-03-25 ENCOUNTER — Telehealth: Payer: Self-pay

## 2022-03-25 NOTE — Telephone Encounter (Signed)
[place] application was received and insurance card was faxed per [facility] request.  ?

## 2022-03-26 ENCOUNTER — Telehealth: Payer: Self-pay

## 2022-03-26 NOTE — Telephone Encounter (Signed)
Patient advise that [facility] application has been approved  ?

## 2022-04-23 ENCOUNTER — Encounter: Payer: Self-pay | Admitting: Internal Medicine

## 2022-04-23 ENCOUNTER — Other Ambulatory Visit: Payer: Self-pay | Admitting: Internal Medicine

## 2022-04-23 ENCOUNTER — Ambulatory Visit: Payer: Medicare HMO | Admitting: Internal Medicine

## 2022-04-23 VITALS — BP 124/70 | HR 87 | Ht 69.0 in | Wt 165.6 lb

## 2022-04-23 DIAGNOSIS — R739 Hyperglycemia, unspecified: Secondary | ICD-10-CM

## 2022-04-23 DIAGNOSIS — Z794 Long term (current) use of insulin: Secondary | ICD-10-CM | POA: Diagnosis not present

## 2022-04-23 DIAGNOSIS — E1142 Type 2 diabetes mellitus with diabetic polyneuropathy: Secondary | ICD-10-CM | POA: Diagnosis not present

## 2022-04-23 DIAGNOSIS — E1165 Type 2 diabetes mellitus with hyperglycemia: Secondary | ICD-10-CM | POA: Diagnosis not present

## 2022-04-23 LAB — POCT GLUCOSE (DEVICE FOR HOME USE): Glucose Fasting, POC: 253 mg/dL — AB (ref 70–99)

## 2022-04-23 LAB — POCT GLYCOSYLATED HEMOGLOBIN (HGB A1C): Hemoglobin A1C: 7.3 % — AB (ref 4.0–5.6)

## 2022-04-23 MED ORDER — ONETOUCH DELICA LANCETS 33G MISC: 1.0000 | Freq: Four times a day (QID) | 3 refills | Status: DC

## 2022-04-23 MED ORDER — ONETOUCH VERIO VI STRP: 1.0000 | ORAL_STRIP | Freq: Four times a day (QID) | 3 refills | Status: DC

## 2022-04-23 NOTE — Patient Instructions (Signed)
-   Continue Basaglar 55 units  ?- Increase Humalog 30 units with each meal  ?- Continue Farxiga 10 mg, 1 tablet daily  ? ?HOW TO TREAT LOW BLOOD SUGARS (Blood sugar LESS THAN 70 MG/DL) ?Please follow the RULE OF 15 for the treatment of hypoglycemia treatment (when your (blood sugars are less than 70 mg/dL)  ? ?STEP 1: Take 15 grams of carbohydrates when your blood sugar is low, which includes:  ?3-4 GLUCOSE TABS  OR ?3-4 OZ OF JUICE OR REGULAR SODA OR ?ONE TUBE OF GLUCOSE GEL   ? ?STEP 2: RECHECK blood sugar in 15 MINUTES ?STEP 3: If your blood sugar is still low at the 15 minute recheck --> then, go back to STEP 1 and treat AGAIN with another 15 grams of carbohydrates. ? ? ? ? ? ? ? ?

## 2022-04-23 NOTE — Progress Notes (Signed)
?  ? ?Name: LONELL STAMOS  ?Age/ Sex: 71 y.o., male   ?MRN/ DOB: [id number], 1951/04/14    ? ?PCP: Elby Showers, MD   ?Reason for Endocrinology Evaluation: Type 2 Diabetes Mellitus  ?Initial Endocrine Consultative Visit: 11/29/2018  ? ? ?PATIENT IDENTIFIER: Mr. COY ROCHFORD is a 71 y.o. male with a past medical history of DM, Oartic insufficiency and hyperlipidemia . The patient has followed with Endocrinology clinic since 11/29/2018 for consultative assistance with management of his diabetes. ? ?DIABETIC HISTORY:  ?Mr. Chicoine was diagnosed with T2DM ~ 2009, he does not recall any anti-glycemic agents he has been on, he has been on insulin for many years.  His hemoglobin A1c has ranged from 8.0% in 2019, peaking at 12.4% in 2016. ? ? ?Pt with chronic intermittent Hx of diarrhea. That at times he attributes to Hx  ETOH intake.  ? ?[place] started 05/2021 ? ?SUBJECTIVE:  ? ?During the last visit (10/22/2021): A1c 7.2 %. We adjusted MDI regimen.  Increased Farxiga  ? ? ? ?Today (04/23/2022): Mr. Mccadden is here for a 4 month follow up on his diabetes management.  He checks his blood sugars 3 times daily. The patient has had hypoglycemic episodes but he believes this was inaccurate as he checked twice after that with BG readings in 300's x2 ? ? ?Since his last visit here , he was diagnosed with OM of right great toe . He is S/P proximal amputation 11/2021. He continues to  follow up with [facility]  ? ?Denies  nausea , vomiting or diarrhea  ? ? ? ? ?HOME DIABETES REGIMEN:  ?Basaglar 55 units daily  ?Humalog 25 units with breakfast and Lunch and 25-30 units with Supper ?Farxiga 10 mg daily  ?  ? ? ? ? ?GLUCOSE LOG: ?Did not bring  ? ? ?DIABETIC COMPLICATIONS: ?Microvascular complications:  ?Neuropathy  ?Denies: retinopathy, nephropathy  ?Last eye exam: Completed 2022 ?  ?Macrovascular complications:  ?Denies: CAD, PVD, CVA ?  ? ? ?HISTORY:  ?Past Medical History:  ?Past Medical History:  ?Diagnosis Date  ? Cancer  [facility])   ? basal cell both shoulders  ? Diabetes mellitus   ? insulin dependant  ? Hyperlipidemia   ? ?Past Surgical History:  ?Past Surgical History:  ?Procedure Laterality Date  ? repair of rt knee laceration    ? ?Social History:  reports that he has quit smoking. He has never used smokeless tobacco. He reports that he does not drink alcohol and does not use drugs. ?Family History:  ?Family History  ?Problem Relation Age of Onset  ? Heart disease Father   ? Diabetes Father   ? Mental illness Father   ? Alzheimer's disease Father   ? COPD Brother   ? Diabetes Mother   ? Alzheimer's disease Mother   ? Colon cancer Neg Hx   ? ? ? ?HOME MEDICATIONS: ?Allergies as of 04/23/2022   ? ?   Reactions  ? Sulfa Antibiotics   ? hallucinations  ? ?  ? ?  ?Medication List  ?  ? ?  ? Accurate as of Apr 23, 2022 11:05 AM. If you have any questions, ask your nurse or doctor.  ?  ?  ? ?  ? ?ALPRAZolam 0.25 MG tablet ?Commonly known as: Duanne Moron ?TAKE 1 TABLET BY MOUTH EVERYDAY AT BEDTIME ?  ?aspirin EC 81 MG tablet ?Take 1 tablet (81 mg total) by mouth daily. ?  ?Basaglar KwikPen 100 UNIT/ML ?Inject  55 Units into the skin daily. ?  ?BD Pen Needle Nano 2nd Gen 32G X 4 MM Misc ?Generic drug: Insulin Pen Needle ?USE 1 PEN NEEDLE DAILY ?  ?blood glucose meter kit and supplies Kit ?Dispense based on patient and insurance preference. ICD-10: E11.9 ?  ?dapagliflozin propanediol 10 MG Tabs tablet ?Commonly known as: [place] ?Take 1 tablet (10 mg total) by mouth daily before breakfast. ?  ?gabapentin 100 MG capsule ?Commonly known as: NEURONTIN ?Take 1 capsule (100 mg total) by mouth 3 (three) times daily. ?  ?glucose blood test strip ?Use as instructed to check blood sugar before meals and at bedtime ?What changed:  ?how much to take ?how to take this ?when to take this ?  ?HumaLOG KwikPen 200 UNIT/ML KwikPen ?Generic drug: insulin lispro ?Max daily 90 units ?  ?hydrochlorothiazide 25 MG tablet ?Commonly known as: HYDRODIURIL ?TAKE 1 TABLET BY  MOUTH EVERY DAY. NEED OFFICE VISIT ?  ?losartan 100 MG tablet ?Commonly known as: COZAAR ?TAKE 1 TABLET BY MOUTH EVERY DAY ?  ?metoCLOPramide 5 MG tablet ?Commonly known as: REGLAN ?TAKE 1 TABLET BY MOUTH 3 TIMES A DAY BEFORE MEALS ?  ?onetouch ultrasoft lancets ?Use as instructed ?  ?pantoprazole 40 MG tablet ?Commonly known as: PROTONIX ?TAKE 1 TABLET BY MOUTH EVERY DAY ?  ?rosuvastatin 20 MG tablet ?Commonly known as: CRESTOR ?TAKE 1 TABLET BY MOUTH EVERY DAY ?  ?Santyl 250 UNIT/GM ointment ?Generic drug: collagenase ?Apply 1 application topically daily. ?  ?sildenafil 100 MG tablet ?Commonly known as: Viagra ?TAKE 1 TABLET 1 HOUR BEFORE INTERCOURSE ?  ? ?  ? ? ?PHYSICAL EXAM: ?VS: BP 124/70 (BP Location: Left Arm, Patient Position: Sitting, Cuff Size: Small)   Pulse 87   Ht 5' 9"  (1.753 m)   Wt 165 lb 9.6 oz (75.1 kg)   SpO2 99%   BMI 24.45 kg/m?   ? ?EXAM: ?General: Pt appears well and is in NAD  ?Lungs: Clear with good BS bilat with no rales, rhonchi, or wheezes  ?Heart: Auscultation: RRR with normal S1 and S2 ?  ?Extremities:  ?BL LE: no pretibial edema  ?  ?Mental Status: Judgment, insight: intact ?Orientation: oriented to time, place, and person ?Mood and affect: no depression, anxiety, or agitation  ? ? ? DM Foot Exam  04/23/2022 ? ?The skin of the feet is  without sores or ulcerations ,priximal amputations of  right great toe  ?The pedal pulses are decreased bilaterally  ?The sensation is decreased to a screening 5.07, 10 gram monofilament bilaterally ? ? ? ?DATA REVIEWED: ? ?Lab Results  ?Component Value Date  ? HGBA1C 7.3 (A) 04/23/2022  ? HGBA1C 7.2 (A) 10/22/2021  ? HGBA1C 7.6 (H) 07/29/2021  ? ?Lab Results  ?Component Value Date  ? MICROALBUR 0.2 07/29/2021  ? [place] 81 07/29/2021  ? CREATININE 0.90 07/29/2021  ? ?Lab Results  ?Component Value Date  ? MICRALBCREAT 4 07/29/2021  ? ? ? ?Lab Results  ?Component Value Date  ? CHOL 156 07/29/2021  ? HDL 47 07/29/2021  ? [place] 81 07/29/2021  ? TRIG  185 (H) 07/29/2021  ? CHOLHDL 3.3 07/29/2021  ?    ?Results for KAVEN, CUMBIE" (MRN [id number]) as of 10/22/2021 10:01 ? Ref. Range 07/29/2021 09:26  ?Sodium Latest Ref Range: 135 - 146 mmol/L 137  ?Potassium Latest Ref Range: 3.5 - 5.3 mmol/L 3.5  ?Chloride Latest Ref Range: 98 - 110 mmol/L 98  ?CO2 Latest Ref Range: 20 - 32  mmol/L 30  ?Glucose Latest Ref Range: 65 - 99 mg/dL 140 (H)  ?Mean Plasma Glucose Latest Units: mg/dL 171  ?BUN Latest Ref Range: 7 - 25 mg/dL 19  ?Creatinine Latest Ref Range: 0.70 - 1.35 mg/dL 0.90  ?Calcium Latest Ref Range: 8.6 - 10.3 mg/dL 9.5  ?BUN/Creatinine Ratio Latest Ref Range: 6 - 22 (calc) NOT APPLICABLE  ?eGFR Latest Ref Range: > OR = 60 mL/min/1.28m 93  ?AG Ratio Latest Ref Range: 1.0 - 2.5 (calc) 1.8  ?AST Latest Ref Range: 10 - 35 U/L 16  ?ALT Latest Ref Range: 9 - 46 U/L 20  ?Total Protein Latest Ref Range: 6.1 - 8.1 g/dL 6.6  ?Total Bilirubin Latest Ref Range: 0.2 - 1.2 mg/dL 0.4  ? ?In Office BG 253 mg/dL  ? ? ? ?ASSESSMENT / PLAN / RECOMMENDATIONS:  ? ?1) Type 2 Diabetes Mellitus, Sub-Optially controlled, With neuropathic complications - Most recent A1c of 7.3 %. Goal A1c < 7.0 %.  ? ? ?- A1c stable  ?- His fasting BG's this morning was 165 mg/dL, he took 25 units of Humalog and his postprandial Bg was 251 mg/dL, will increase prandial dose based on that  ? ?-We had discussed CGM technology in the past but he is not keen on it ?-Has chronic history of GI issues, and he would like to avoid any medications that we will cause GI side effects.  Not a candidate for GLP-1 agonist nor metformin ?-He is tolerating FIranwithout side effects, will increase as below, the cost was $47 a month, he was provided with patient assistance papers but did not qualify ? ? ?MEDICATIONS: ? ? ?Continue Basaglar 55 units daily  ?Increase Humalog 30 units 3 times daily before every meal ?Continue Farxiga 10 mg, 1 tablet daily ? ? ?EDUCATION / INSTRUCTIONS: ?BG monitoring instructions:  Patient is instructed to check his blood sugars 4 times a day, before meals and bedtime ?[facility] if: BG persistently < 70  ?I reviewed the Rule of 15 for the treatment of hypoglycemia in d

## 2022-06-10 ENCOUNTER — Other Ambulatory Visit: Payer: Self-pay | Admitting: Internal Medicine

## 2022-07-09 IMAGING — MR MR FOOT*R* W/O CM
4 of 5 series · 18 of 40 positions shown · non-contrast
Comparison: X-ray 11/05/2021

CLINICAL DATA: Foot swelling, diabetic, osteomyelitis suspected,
xray done. Right great toe nonhealing wound

EXAM:
MRI OF THE RIGHT FOREFOOT WITHOUT CONTRAST
TECHNIQUE: Multiplanar, multisequence MR imaging of the right forefoot was
performed. No intravenous contrast was administered.

[Series 3: T1 · coronal · 3.0mm · 0.19mm/px · 3 of 44 slices shown (1 of 2)]
[im 5/44]
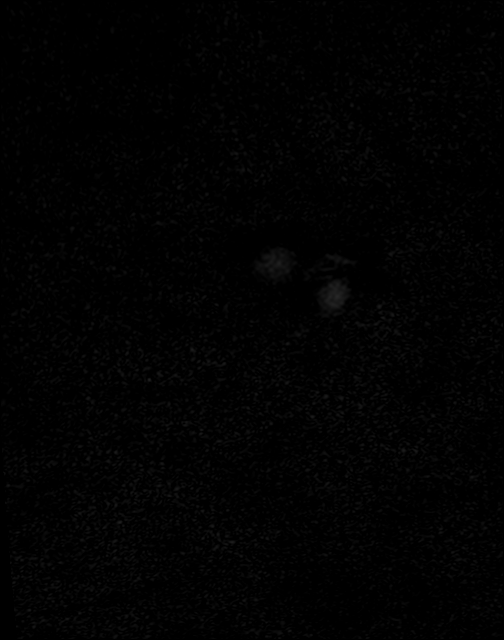
[im 22/44]
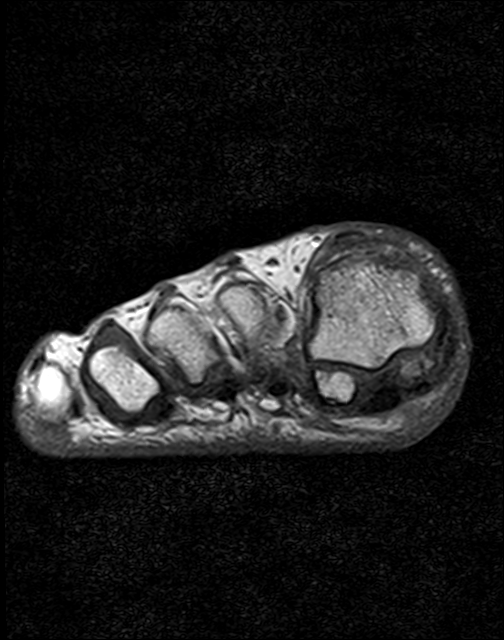
[im 39/44]
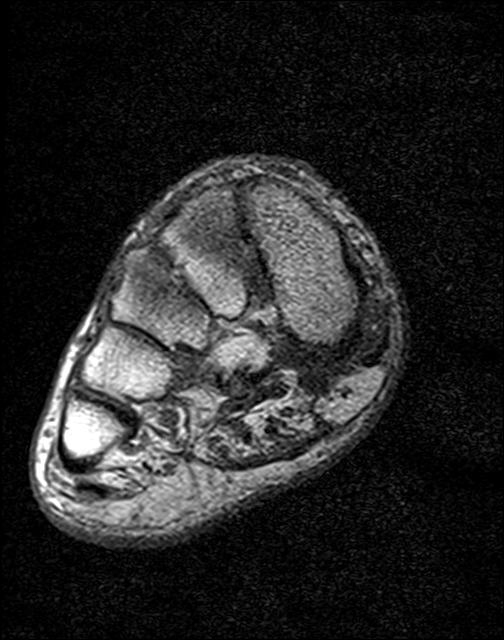

[Series 4: T2 fat-sat · coronal · 3.0mm · 0.19mm/px · 9 of 44 slices shown (1 of 2)]
[im 1/44]
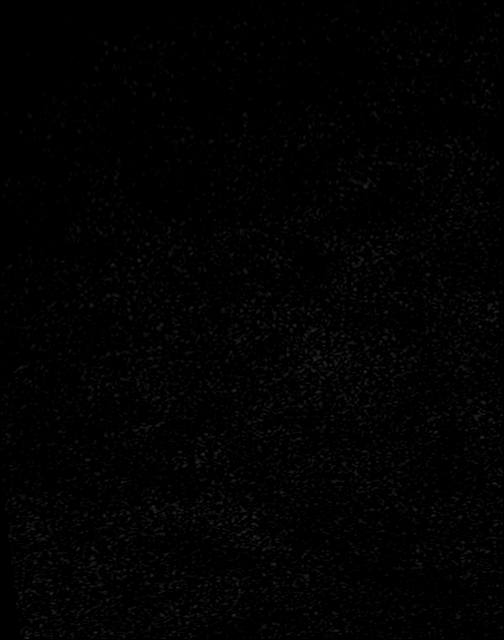
[im 5/44]
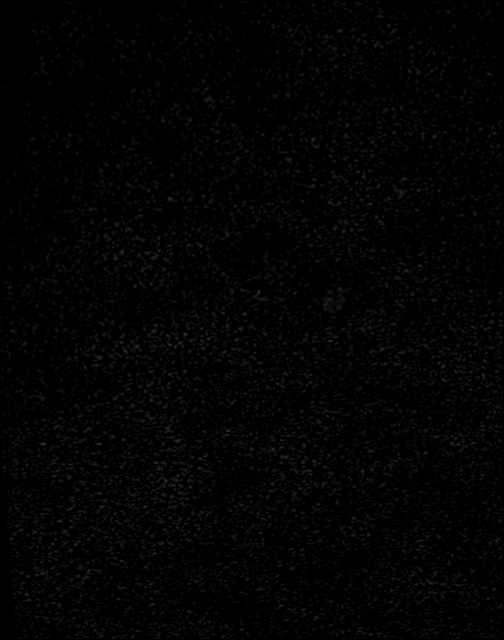
[im 9/44]
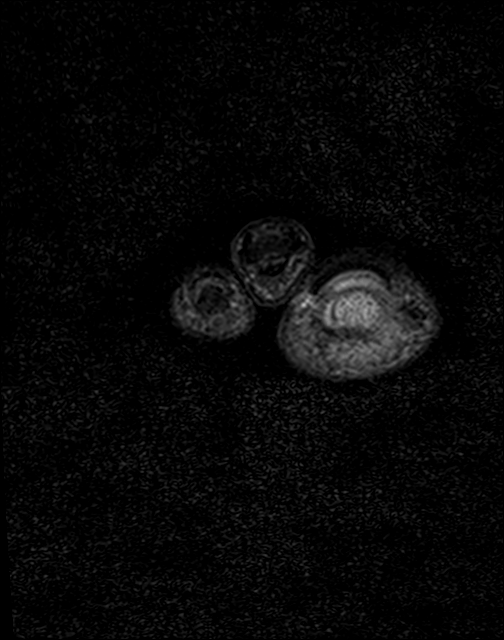
[im 13/44]
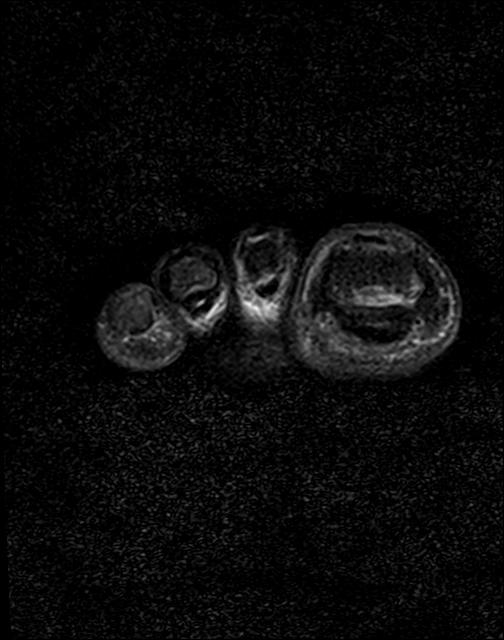
[im 18/44]
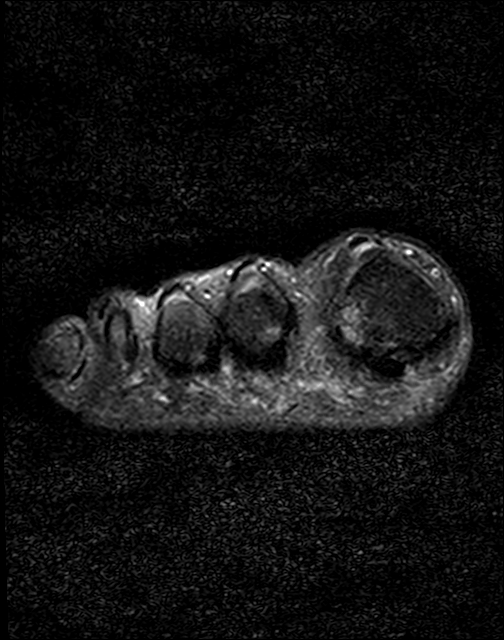
[im 22/44]
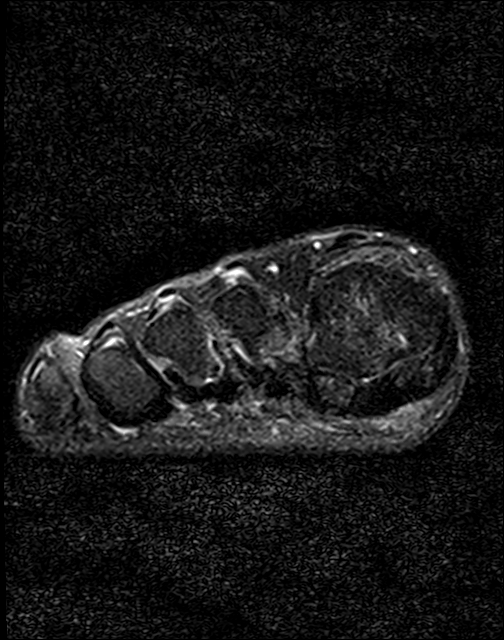
[im 26/44]
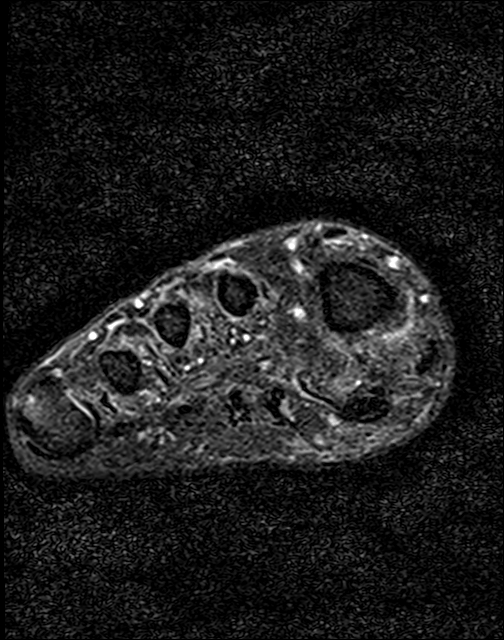
[im 31/44]
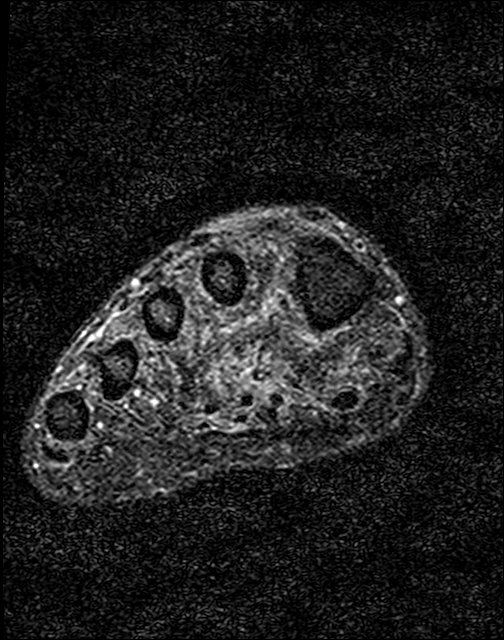
[im 39/44]
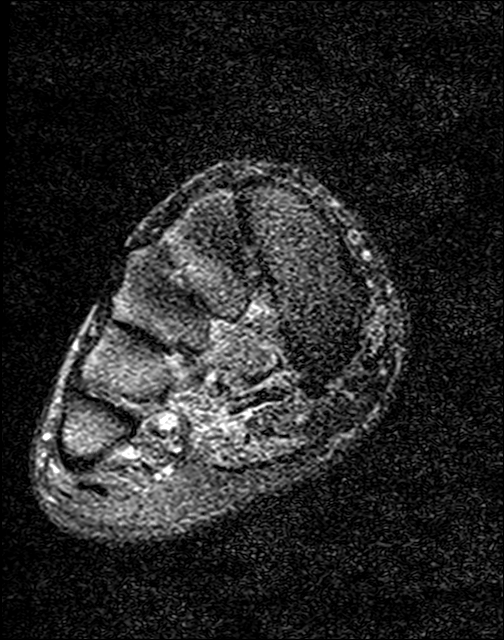

[Series 5: T2 fat-sat · axial · 3.0mm · 0.35mm/px · z∈[-113,-43]mm · 3 of 20 slices shown (2 of 2)]
[im 1/20]
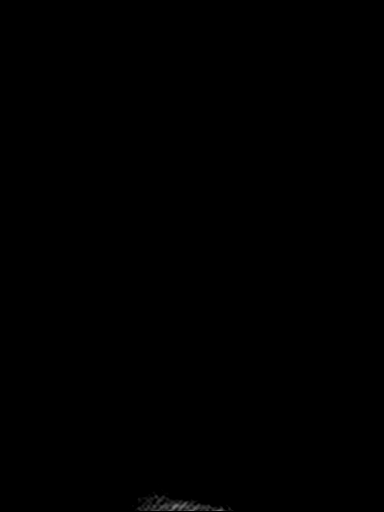
[im 10/20]
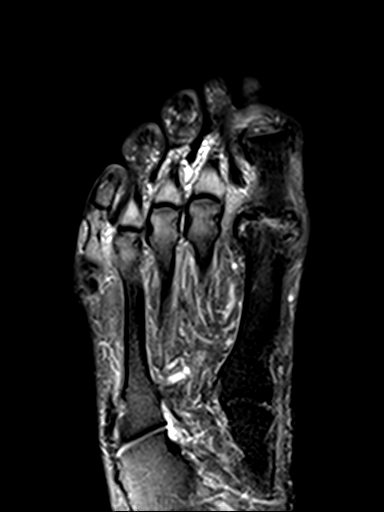
[im 20/20]
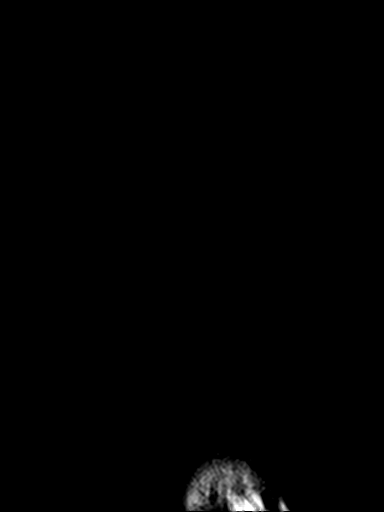

[Series 6: T1 · axial · 3.0mm · 0.35mm/px · z∈[-102,-39]mm · 3 of 22 slices shown (2 of 2)]
[im 5/22]
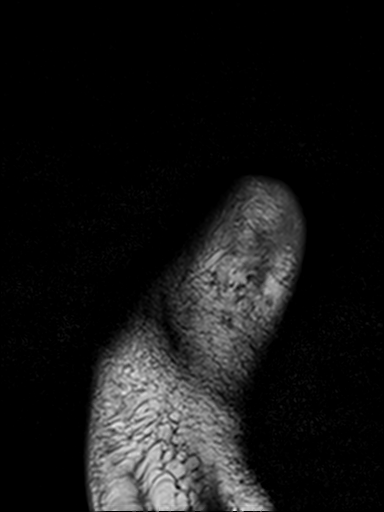
[im 13/22]
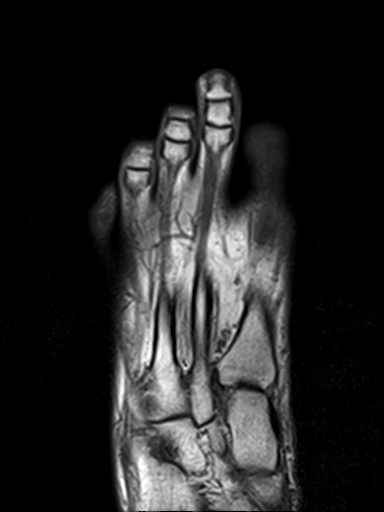
[im 22/22]
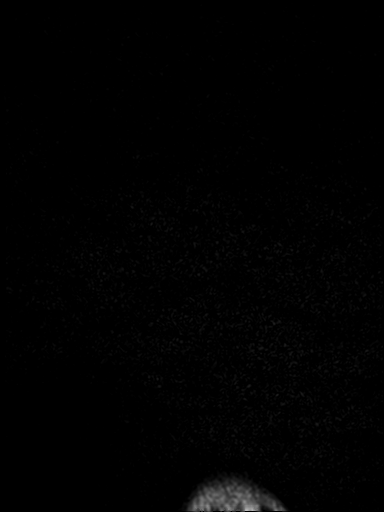

[18 of 40 positions shown; findings below may reference images not displayed]

FINDINGS: Bones/Joint/Cartilage

Extensive bone marrow edema with confluent low T1 marrow signal
throughout the distal phalanx of the right great toe compatible with
acute osteomyelitis. Minimal subcortical marrow edema along the
plantar surface of the great toe proximal phalanx distally, which
may reflect reactive osteitis or early acute osteomyelitis (series
7, image 21). Remaining osseous structures are intact. Moderate
osteoarthritis of the first MTP joint with chondral thinning and
subchondral cystic changes along both sides of the joint. No
fracture or dislocation.

Ligaments

Intact Lisfranc ligament. Collateral ligaments of the forefoot
appear intact.

Muscles and Tendons

Chronic denervation changes of the intrinsic foot musculature.
Flexor and extensor tendons intact. No significant tenosynovial
fluid collection.

Soft tissues

Soft tissue irregularity overlying the tip of the great toe distal
phalanx compatible with nonhealing wound or ulceration. Mild soft
tissue edema. No organized fluid collection.
IMPRESSION: 1. Acute osteomyelitis of the distal phalanx of the right great toe.
2. Minimal subcortical marrow edema along the plantar surface of the
great toe proximal phalanx distally, which may reflect reactive
osteitis or early acute osteomyelitis.
3. Moderate osteoarthritis of the first MTP joint.

These results will be called to the ordering clinician or
representative by the Radiologist Assistant, and communication
documented in the PACS or [REDACTED].

## 2022-07-22 ENCOUNTER — Ambulatory Visit (INDEPENDENT_AMBULATORY_CARE_PROVIDER_SITE_OTHER): Payer: Medicare HMO | Admitting: Podiatry

## 2022-07-22 DIAGNOSIS — Z91199 Patient's noncompliance with other medical treatment and regimen due to unspecified reason: Secondary | ICD-10-CM

## 2022-07-22 NOTE — Progress Notes (Signed)
No show

## 2022-07-29 ENCOUNTER — Encounter: Payer: Self-pay | Admitting: Gastroenterology

## 2022-08-18 ENCOUNTER — Other Ambulatory Visit: Payer: Self-pay | Admitting: Internal Medicine

## 2022-09-01 ENCOUNTER — Ambulatory Visit: Payer: Medicare HMO | Attending: Cardiovascular Disease | Admitting: Cardiovascular Disease

## 2022-09-06 ENCOUNTER — Other Ambulatory Visit: Payer: Self-pay | Admitting: Internal Medicine

## 2022-09-08 DIAGNOSIS — Z85828 Personal history of other malignant neoplasm of skin: Secondary | ICD-10-CM | POA: Diagnosis not present

## 2022-09-08 DIAGNOSIS — L821 Other seborrheic keratosis: Secondary | ICD-10-CM | POA: Diagnosis not present

## 2022-09-08 DIAGNOSIS — L570 Actinic keratosis: Secondary | ICD-10-CM | POA: Diagnosis not present

## 2022-09-08 DIAGNOSIS — C44329 Squamous cell carcinoma of skin of other parts of face: Secondary | ICD-10-CM | POA: Diagnosis not present

## 2022-09-17 ENCOUNTER — Other Ambulatory Visit: Payer: Self-pay | Admitting: Internal Medicine

## 2022-09-25 ENCOUNTER — Telehealth: Payer: Self-pay | Admitting: Internal Medicine

## 2022-09-25 DIAGNOSIS — H35433 Paving stone degeneration of retina, bilateral: Secondary | ICD-10-CM | POA: Diagnosis not present

## 2022-09-25 DIAGNOSIS — H40013 Open angle with borderline findings, low risk, bilateral: Secondary | ICD-10-CM | POA: Diagnosis not present

## 2022-09-25 DIAGNOSIS — H53121 Transient visual loss, right eye: Secondary | ICD-10-CM | POA: Diagnosis not present

## 2022-09-25 DIAGNOSIS — I951 Orthostatic hypotension: Secondary | ICD-10-CM | POA: Diagnosis not present

## 2022-09-25 DIAGNOSIS — H25813 Combined forms of age-related cataract, bilateral: Secondary | ICD-10-CM | POA: Diagnosis not present

## 2022-09-25 DIAGNOSIS — H35033 Hypertensive retinopathy, bilateral: Secondary | ICD-10-CM | POA: Diagnosis not present

## 2022-09-25 DIAGNOSIS — H527 Unspecified disorder of refraction: Secondary | ICD-10-CM | POA: Diagnosis not present

## 2022-09-25 DIAGNOSIS — E119 Type 2 diabetes mellitus without complications: Secondary | ICD-10-CM | POA: Diagnosis not present

## 2022-09-25 DIAGNOSIS — Z794 Long term (current) use of insulin: Secondary | ICD-10-CM | POA: Diagnosis not present

## 2022-09-25 LAB — HM DIABETES EYE EXAM

## 2022-09-25 NOTE — Telephone Encounter (Signed)
Ivin Booty from [place] [phone] phone [phone] fax  Ivin Booty called to say that Allene Pyo wanted Joseph Dixon to come and see Dr Renold Genta and have Carotid Study and and Echo, that he has had [facility]. I have ask them to fax over the office notes so that you can see what is going on. I let them know you were out of office until Tuesday and they said it could wait till them, because I ask if patient needed to be sent to Urgent Care or Emergency room and they said no it could wait until next week.  I called Joseph Dixon and ask him, he said that he had a spell like he had several years ago where his right eye quit working while he was watching TV, he could not see anything, then it was blurry.  IT lasted for 3-4 minutes, he said that the eye doctor said it cold have been TIA in his eye, he also said that the eye doctor heard a heart murmur.  Still waiting on fax.

## 2022-09-25 NOTE — Telephone Encounter (Signed)
Scheduled 10/05/2022 '@10'$ :30

## 2022-09-29 ENCOUNTER — Encounter: Payer: Self-pay | Admitting: Internal Medicine

## 2022-10-05 ENCOUNTER — Ambulatory Visit (INDEPENDENT_AMBULATORY_CARE_PROVIDER_SITE_OTHER): Payer: Medicare HMO | Admitting: Internal Medicine

## 2022-10-05 ENCOUNTER — Encounter: Payer: Self-pay | Admitting: Internal Medicine

## 2022-10-05 VITALS — BP 120/60 | HR 64 | Temp 98.1°F | Ht 69.0 in | Wt 160.1 lb

## 2022-10-05 DIAGNOSIS — E785 Hyperlipidemia, unspecified: Secondary | ICD-10-CM | POA: Diagnosis not present

## 2022-10-05 DIAGNOSIS — I499 Cardiac arrhythmia, unspecified: Secondary | ICD-10-CM | POA: Diagnosis not present

## 2022-10-05 DIAGNOSIS — E1169 Type 2 diabetes mellitus with other specified complication: Secondary | ICD-10-CM

## 2022-10-05 DIAGNOSIS — I491 Atrial premature depolarization: Secondary | ICD-10-CM

## 2022-10-05 DIAGNOSIS — I739 Peripheral vascular disease, unspecified: Secondary | ICD-10-CM

## 2022-10-05 DIAGNOSIS — G453 Amaurosis fugax: Secondary | ICD-10-CM

## 2022-10-05 DIAGNOSIS — R011 Cardiac murmur, unspecified: Secondary | ICD-10-CM

## 2022-10-05 DIAGNOSIS — I351 Nonrheumatic aortic (valve) insufficiency: Secondary | ICD-10-CM

## 2022-10-05 DIAGNOSIS — Z794 Long term (current) use of insulin: Secondary | ICD-10-CM | POA: Diagnosis not present

## 2022-10-05 DIAGNOSIS — E1165 Type 2 diabetes mellitus with hyperglycemia: Secondary | ICD-10-CM

## 2022-10-05 DIAGNOSIS — I10 Essential (primary) hypertension: Secondary | ICD-10-CM | POA: Diagnosis not present

## 2022-10-05 DIAGNOSIS — Z125 Encounter for screening for malignant neoplasm of prostate: Secondary | ICD-10-CM

## 2022-10-05 NOTE — Patient Instructions (Addendum)
Flu vaccine today.  We will see that he gets an urgent appointment with his Cardiologist and also will be referred to [facility] with what sounds like amaurosis fugax.  Eight 2D echocardiogram was ordered to evaluate murmur.

## 2022-10-05 NOTE — Progress Notes (Signed)
Subjective:    Patient ID: Joseph Dixon, male    DOB: Oct 01, 1951, 71 y.o.   MRN: [id number]  HPI 71 year old Male seen for urgent matter. He went to Ophthalmologist recently at [facility]. He told eye physician that when pouring coffee, he  had seen white spots in front of right eye then a grayish vision disturbance for a short time- maybe 2-3 minutes.  He was watching TV at the time.  Unfortunately he missed recent appt with Cardiologist.  This will need to be rescheduled.  Had Echocardiogram in 2016 she will mild to moderate mitral regurgitation.  He has a history of type 2 diabetes mellitus and is followed by Dr. Kelton Pillar at [facility]  History of right great toe ulcer seen at [facility] involving necrosis of the bone and requiring debridement.  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  We last saw patient in August 2022 for Medicare annual wellness visit.  He does have a history of peripheral artery disease and has seen Dr. Fletcher Anon in Cardiology.  He unfortunately had an appointment with him recently and missed it.  Patient has a history of type 2 diabetes mellitus, hypertension, hyperlipidemia and peripheral artery disease.  He had an ulceration of left great toe in 2019 which was slow to heal.  Was found to have near normal ABIs at that time in his leg.  Duplex study showed occluded anterior tibial artery on the right and occluded anterior tibial and peroneal arteries on the left.  The ulcer healed without need for revascularization.  History of cardiac murmur noted in 2016.  He had a 2D echocardiogram that showed mild to moderate aortic regurgitation.  Mitral valve had calcified annulus.  Right ventricle mildly dilated.  Grade 1 diastolic dysfunction.  Social history: He does not smoke.  Currently retired.  He is a widower.  Drinks a few beers a month.  Past medical history: He is allergic to sulfa.  It causes an adverse reaction.  Intolerant of  metformin.  Fractured nose 1996.  Headache requiring hospitalization 1966.  Viral meningitis 1970.  Basal cell carcinoma of the left shoulder 2003.  1 point he had some intermittent nausea and vomiting and I placed him on Reglan and that seemed to help.  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  Family history: Mother died of complications of hip fracture and had history of diabetes.  Father died at age 53 with complications of Alzheimer's disease, history of pacemaker and diabetes.  1 brother with history of COPD.  1 sister in good health.  Son and daughter in good health.  Labs drawn today include CBC with differential, c-Met, PSA and hemoglobin A1c.  In May at Endocrinology his hemoglobin A1c was 7.3%        Review of Systems walks dogs for exercise. No chest pain.     Objective:   Physical Exam Vital signs reviewed.  Blood pressure 120/60 pulse 64 irregular temperature 98.1 degrees pulse oximetry 98% weight 160 pounds 1.9 ounces BMI 23.65  Skin: Warm and dry.  No cervical adenopathy, thyromegaly or carotid bruits.  Chest clear.  Irregular rhythm. 1/6 to 2/6 systolic ejection murmur.  No lower extremity pitting edema.  Brief neurological exam is intact currently without gross focal deficits. EKG shows sinus rhythm with PACs.  Initially on arrival his pulse was significantly irregular and I thought he might be in atrial fibs.    Assessment & Plan:  Amaurosis fugax.  Perhaps has intermittent atrial fibrillation.  Type 2 diabetes mellitus-seen by endocrinology and fairly well controlled by patient.  He is on Farxiga and insulin  GE reflux treated with Protonix  Possible gastroparesis treated with Reglan before meals  History of anxiety treated with Xanax  History of peripheral neuropathy treated with Neurontin by podiatrist  Hyperlipidemia treated with rosuvastatin 20 mg daily  PACs-possible A-fib  History of mild to moderate aortic insufficiency in 2016 on  echo  Hypertension treated with losartan and HCTZ  Hyperlipidemia treated with Crestor  History of claudication bilaterally-has seen Dr. Jenna Luo  Plan: PSA, CBC c-Met hemoglobin A1c drawn today.  An order was placed for 2D echocardiogram.  He needs to be seen by his cardiologist, Dr. Jenna Luo  He also needs evaluation by Neurologist which likely will include CT/MRI of the brain.  Flu vaccine given today.

## 2022-10-06 LAB — CBC WITH DIFFERENTIAL/PLATELET
Absolute Monocytes: 518 cells/uL (ref 200–950)
Basophils Absolute: 50 cells/uL (ref 0–200)
Basophils Relative: 0.7 %
Eosinophils Absolute: 178 cells/uL (ref 15–500)
Eosinophils Relative: 2.5 %
HCT: 44.3 % (ref 38.5–50.0)
Hemoglobin: 15.2 g/dL (ref 13.2–17.1)
Lymphs Abs: 1456 cells/uL (ref 850–3900)
MCH: 30 pg (ref 27.0–33.0)
MCHC: 34.3 g/dL (ref 32.0–36.0)
MCV: 87.5 fL (ref 80.0–100.0)
MPV: 13 fL — ABNORMAL HIGH (ref 7.5–12.5)
Monocytes Relative: 7.3 %
Neutro Abs: 4899 cells/uL (ref 1500–7800)
Neutrophils Relative %: 69 %
Platelets: 188 10*3/uL (ref 140–400)
RBC: 5.06 10*6/uL (ref 4.20–5.80)
RDW: 13.3 % (ref 11.0–15.0)
Total Lymphocyte: 20.5 %
WBC: 7.1 10*3/uL (ref 3.8–10.8)

## 2022-10-06 LAB — COMPLETE METABOLIC PANEL WITH GFR
AG Ratio: 1.7 (calc) (ref 1.0–2.5)
ALT: 19 U/L (ref 9–46)
AST: 18 U/L (ref 10–35)
Albumin: 4.2 g/dL (ref 3.6–5.1)
Alkaline phosphatase (APISO): 66 U/L (ref 35–144)
BUN: 10 mg/dL (ref 7–25)
CO2: 26 mmol/L (ref 20–32)
Calcium: 9.5 mg/dL (ref 8.6–10.3)
Chloride: 103 mmol/L (ref 98–110)
Creat: 0.75 mg/dL (ref 0.70–1.35)
Globulin: 2.5 g/dL (calc) (ref 1.9–3.7)
Glucose, Bld: 156 mg/dL — ABNORMAL HIGH (ref 65–139)
Potassium: 4.5 mmol/L (ref 3.5–5.3)
Sodium: 140 mmol/L (ref 135–146)
Total Bilirubin: 0.4 mg/dL (ref 0.2–1.2)
Total Protein: 6.7 g/dL (ref 6.1–8.1)
eGFR: 98 mL/min/{1.73_m2} (ref >=60)

## 2022-10-06 LAB — HEMOGLOBIN A1C
Hgb A1c MFr Bld: 7.2 % of total Hgb — ABNORMAL HIGH (ref <5.7)
Mean Plasma Glucose: 160 mg/dL
eAG (mmol/L): 8.9 mmol/L

## 2022-10-06 LAB — PSA: PSA: 0.36 ng/mL (ref <=4.00)

## 2022-10-09 ENCOUNTER — Encounter: Payer: Self-pay | Admitting: Internal Medicine

## 2022-10-14 DIAGNOSIS — C44329 Squamous cell carcinoma of skin of other parts of face: Secondary | ICD-10-CM | POA: Diagnosis not present

## 2022-10-20 ENCOUNTER — Telehealth: Payer: Self-pay

## 2022-10-20 NOTE — Telephone Encounter (Signed)
Patient will fill out a new [place] application when he comes in for his appointment on 10/27/22

## 2022-10-26 NOTE — Progress Notes (Unsigned)
Name: Joseph Dixon  Age/ Sex: 71 y.o., male   MRN/ DOB: [id number], 05-06-1951     PCP: Elby Showers, MD   Reason for Endocrinology Evaluation: Type 2 Diabetes Mellitus  Initial Endocrine Consultative Visit: 11/29/2018    PATIENT IDENTIFIER: Joseph Dixon is a 70 y.o. male with a past medical history of DM, Oartic insufficiency and hyperlipidemia . The patient has followed with Endocrinology clinic since 11/29/2018 for consultative assistance with management of his diabetes.  DIABETIC HISTORY:  [name] was diagnosed with T2DM ~ 2009, he does not recall any anti-glycemic agents he has been on, he has been on insulin for many years.  His hemoglobin A1c has ranged from 8.0% in 2019, peaking at 12.4% in 2016.   Pt with chronic intermittent Hx of diarrhea. That at times he attributes to Hx  ETOH intake.   Farxiga started 05/2021  SUBJECTIVE:   During the last visit (04/23/2022): A1c 7.3 %    Today (10/27/2022): Mr. Ptacek is here for a 4 month follow up on his diabetes management.  He checks his blood sugars 3 times daily. The patient has had hypoglycemic episodes but he believes this was inaccurate as he checked twice after that with BG readings in 300's x2  Denies nausea, vomiting or diarrhea except  one evening last week associated with headache . Has been having more frequent headaches over the past 2 months, was recently seen by ophthalmology . Had loss of  transient right eye sight last month.   Has pending cardiology  appointment and neurology     Since his last visit here , he was diagnosed with OM of right great toe . He is S/P proximal amputation 11/2021.      HOME DIABETES REGIMEN:  Basaglar 55 units daily  Humalog 25- 30 units TIDQAC Farxiga 10 mg daily        GLUCOSE LOG: 66-374 mg/dL    DIABETIC COMPLICATIONS: Microvascular complications:  Neuropathy , retinopathy Denies: retinopathy, nephropathy  Last eye exam: Completed 09/25/2022    Macrovascular complications:  Denies: CAD, PVD, CVA     HISTORY:  Past Medical History:  Past Medical History:  Diagnosis Date   Cancer ([place])    basal cell both shoulders   Diabetes mellitus    insulin dependant   Hyperlipidemia    Past Surgical History:  Past Surgical History:  Procedure Laterality Date   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   Proximal right great toe   repair of rt knee laceration     Social History:  reports that he has quit smoking. He has never used smokeless tobacco. He reports that he does not drink alcohol and does not use drugs. Family History:  Family History  Problem Relation Age of Onset   Heart disease Father    Diabetes Father    Mental illness Father    Alzheimer's disease Father    COPD Brother    Diabetes Mother    Alzheimer's disease Mother    Colon cancer Neg Hx      HOME MEDICATIONS: Allergies as of 10/27/2022       Reactions   Sulfa Antibiotics    hallucinations        Medication List        Accurate as of October 27, 2022  7:14 AM. If you have any questions, ask your nurse or doctor.          ALPRAZolam 0.25 MG tablet Commonly  known as: XANAX TAKE 1 TABLET BY MOUTH EVERYDAY AT BEDTIME   aspirin EC 81 MG tablet Take 1 tablet (81 mg total) by mouth daily.   Basaglar KwikPen 100 UNIT/ML Inject 55 Units into the skin daily.   BD Pen Needle Nano 2nd Gen 32G X 4 MM Misc Generic drug: Insulin Pen Needle USE 1 PEN NEEDLE DAILY   blood glucose meter kit and supplies Kit Dispense based on patient and insurance preference. ICD-10: E11.9   dapagliflozin propanediol 10 MG Tabs tablet Commonly known as: Farxiga Take 1 tablet (10 mg total) by mouth daily before breakfast.   gabapentin 100 MG capsule Commonly known as: NEURONTIN Take 1 capsule (100 mg total) by mouth 3 (three) times daily.   HumaLOG KwikPen 200 UNIT/ML KwikPen Generic drug: insulin lispro Max daily 90 units   hydrochlorothiazide 25 MG  tablet Commonly known as: HYDRODIURIL TAKE 1 TABLET BY MOUTH EVERY DAY. NEED OFFICE VISIT   losartan 100 MG tablet Commonly known as: COZAAR TAKE 1 TABLET BY MOUTH EVERY DAY   metoCLOPramide 5 MG tablet Commonly known as: REGLAN TAKE 1 TABLET BY MOUTH 3 TIMES A DAY BEFORE MEALS   OneTouch Delica Lancets 88C Misc 1 Device by Does not apply route in the morning, at noon, in the evening, and at bedtime.   OneTouch Verio test strip Generic drug: glucose blood Check sugar 4 times daily   pantoprazole 40 MG tablet Commonly known as: PROTONIX TAKE 1 TABLET BY MOUTH EVERY DAY   rosuvastatin 20 MG tablet Commonly known as: CRESTOR TAKE 1 TABLET BY MOUTH EVERY DAY   Santyl 250 UNIT/GM ointment Generic drug: collagenase Apply 1 application topically daily.   sildenafil 100 MG tablet Commonly known as: Viagra TAKE 1 TABLET 1 HOUR BEFORE INTERCOURSE        PHYSICAL EXAM: VS: There were no vitals taken for this visit.   EXAM: General: Pt appears well and is in NAD  Lungs: Clear with good BS bilat with no rales, rhonchi, or wheezes  Heart: Auscultation: RRR with normal S1 and S2   Extremities:  BL LE: no pretibial edema    Mental Status: Judgment, insight: intact Orientation: oriented to time, place, and person Mood and affect: no depression, anxiety, or agitation     DM Foot Exam  04/23/2022  The skin of the feet is  without sores or ulcerations ,priximal amputations of  right great toe  The pedal pulses are decreased bilaterally  The sensation is decreased to a screening 5.07, 10 gram monofilament bilaterally    DATA REVIEWED:  Lab Results  Component Value Date   HGBA1C 7.2 (H) 10/05/2022   HGBA1C 7.3 (A) 04/23/2022   HGBA1C 7.2 (A) 10/22/2021    Latest Reference Range & Units 10/05/22 11:03  Sodium 135 - 146 mmol/L 140  Potassium 3.5 - 5.3 mmol/L 4.5  Chloride 98 - 110 mmol/L 103  CO2 20 - 32 mmol/L 26  Glucose 65 - 139 mg/dL 156 (H)  Mean Plasma Glucose  mg/dL 160  BUN 7 - 25 mg/dL 10  Creatinine 0.70 - 1.35 mg/dL 0.75  Calcium 8.6 - 10.3 mg/dL 9.5  BUN/Creatinine Ratio 6 - 22 (calc) SEE NOTE:  eGFR > OR = 60 mL/min/1.29m 98  AG Ratio 1.0 - 2.5 (calc) 1.7  AST 10 - 35 U/L 18  ALT 9 - 46 U/L 19  Total Protein 6.1 - 8.1 g/dL 6.7  Total Bilirubin 0.2 - 1.2 mg/dL 0.4    ASSESSMENT /  PLAN / RECOMMENDATIONS:   1) Type 2 Diabetes Mellitus, Sub-Optially controlled, With neuropathic and retinopathic complications - Most recent A1c of 7.2 %. Goal A1c < 7.0 %.    - A1c stable  -Not keen on CGM technology  - His BG's fluctuate during the day due to insulin-carb mismatch - No changes  - He was given pt assistance forms for lilly and farxiga  -Has chronic history of GI issues, and he would like to avoid any medications that we will cause GI side effects.  Not a candidate for GLP-1 agonist nor metformin  MEDICATIONS:   Continue Basaglar 55 units daily  Continue  Humalog 25- 30 units 3 times daily before every meal Continue Farxiga 10 mg, 1 tablet daily   EDUCATION / INSTRUCTIONS: BG monitoring instructions: Patient is instructed to check his blood sugars 4 times a day, before meals and bedtime Call [facility] if: BG persistently < 70  I reviewed the Rule of 15 for the treatment of hypoglycemia in detail with the patient. Literature supplied.  2) Diabetic complications:  Eye: Does not have known diabetic retinopathy.  Neuro/ Feet: Does have known diabetic peripheral neuropathy. Renal: Patient does not have known baseline CKD. He is on an ACEI/ARB at present.           F/U 6 months   Signed electronically by: Mack Guise, MD  [facility]  [address] [place], [place] [address] Phone: [phone] FAX: [phone]   CC: Elby Showers, MD [address] [address] Phone: [phone]  Fax: [phone]  Return to  Endocrinology clinic as below: Future Appointments  Date Time Provider [place]  10/27/2022 10:50 AM , Melanie Crazier, MD [facility] None  11/03/2022 10:20 AM Wellington Hampshire, MD [facility] None  11/10/2022  9:20 AM [facility] MJB  11/16/2022 11:00 AM Baxley, Cresenciano Lick, MD [facility] MJB  11/19/2022 10:30 AM Sater, Nanine Means, MD [facility] None

## 2022-10-27 ENCOUNTER — Ambulatory Visit (INDEPENDENT_AMBULATORY_CARE_PROVIDER_SITE_OTHER): Payer: Medicare HMO | Admitting: Internal Medicine

## 2022-10-27 ENCOUNTER — Telehealth: Payer: Self-pay | Admitting: Internal Medicine

## 2022-10-27 ENCOUNTER — Encounter: Payer: Self-pay | Admitting: Internal Medicine

## 2022-10-27 VITALS — BP 130/80 | HR 74 | Ht 69.0 in | Wt 161.0 lb

## 2022-10-27 DIAGNOSIS — Z23 Encounter for immunization: Secondary | ICD-10-CM

## 2022-10-27 DIAGNOSIS — Z794 Long term (current) use of insulin: Secondary | ICD-10-CM

## 2022-10-27 DIAGNOSIS — E1142 Type 2 diabetes mellitus with diabetic polyneuropathy: Secondary | ICD-10-CM

## 2022-10-27 MED ORDER — ONETOUCH VERIO VI STRP: 1.0000 | ORAL_STRIP | Freq: Four times a day (QID) | 3 refills | Status: DC

## 2022-10-27 NOTE — Patient Instructions (Signed)
-   Continue Basaglar 55 units  - Continue Humalog 25- 30 units with each meal  - Continue Farxiga 10 mg, 1 tablet daily   HOW TO TREAT LOW BLOOD SUGARS (Blood sugar LESS THAN 70 MG/DL) Please follow the RULE OF 15 for the treatment of hypoglycemia treatment (when your (blood sugars are less than 70 mg/dL)   STEP 1: Take 15 grams of carbohydrates when your blood sugar is low, which includes:  3-4 GLUCOSE TABS  OR 3-4 OZ OF JUICE OR REGULAR SODA OR ONE TUBE OF GLUCOSE GEL    STEP 2: RECHECK blood sugar in 15 MINUTES STEP 3: If your blood sugar is still low at the 15 minute recheck --> then, go back to STEP 1 and treat AGAIN with another 15 grams of carbohydrates.

## 2022-10-27 NOTE — Telephone Encounter (Signed)
Patient completed his portion of the [employer] for free [employer] form and the [employer] Patient Assistance Program Application form.  Patient also gave financial information for the completion of the forms.  Forms are in Dr. Quin Hoop bin in front office.

## 2022-10-28 NOTE — Telephone Encounter (Signed)
Patient assistance application has been faxed to [employer] and [facility]

## 2022-10-29 DIAGNOSIS — H527 Unspecified disorder of refraction: Secondary | ICD-10-CM | POA: Diagnosis not present

## 2022-10-29 DIAGNOSIS — Z794 Long term (current) use of insulin: Secondary | ICD-10-CM | POA: Diagnosis not present

## 2022-10-29 DIAGNOSIS — H25813 Combined forms of age-related cataract, bilateral: Secondary | ICD-10-CM | POA: Diagnosis not present

## 2022-10-29 DIAGNOSIS — H53121 Transient visual loss, right eye: Secondary | ICD-10-CM | POA: Diagnosis not present

## 2022-10-30 ENCOUNTER — Telehealth: Payer: Self-pay

## 2022-10-30 NOTE — Telephone Encounter (Signed)
Left detailed vm that [facility] application has been approved for the 2024 calendar year.

## 2022-11-03 ENCOUNTER — Ambulatory Visit: Payer: Medicare HMO | Attending: Cardiovascular Disease | Admitting: Cardiovascular Disease

## 2022-11-03 ENCOUNTER — Encounter: Payer: Self-pay | Admitting: Cardiovascular Disease

## 2022-11-03 VITALS — BP 142/58 | HR 66 | Ht 67.0 in | Wt 156.6 lb

## 2022-11-03 DIAGNOSIS — I10 Essential (primary) hypertension: Secondary | ICD-10-CM | POA: Diagnosis not present

## 2022-11-03 DIAGNOSIS — I739 Peripheral vascular disease, unspecified: Secondary | ICD-10-CM

## 2022-11-03 DIAGNOSIS — E785 Hyperlipidemia, unspecified: Secondary | ICD-10-CM

## 2022-11-03 NOTE — Progress Notes (Signed)
Cardiology Office Note   Date:  11/03/2022   ID:  Joseph Dixon, DOB 03-06-1951, MRN [id number]  PCP:  Elby Showers, MD  Cardiologist:   Kathlyn Sacramento, MD   No chief complaint on file.      History of Present Illness: Joseph Dixon is a 71 y.o. male who is here today  for a follow-up visit regarding peripheral arterial disease.  The patient has known history of diabetes mellitus, hypertension and hyperlipidemia.   He is not a smoker and has no family history of premature coronary artery disease. The patient was seen in 2019 for a slow healing  small ulceration on the left big toe. He underwent noninvasive vascular evaluation which showed near normal ABI.  Duplex showed mainly tibial disease with an occluded anterior tibial artery on the right and an occluded anterior tibial and peroneal arteries on the left.  The ulceration healed without need for revascularization. 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  He has been doing well with no recurrent ulcerations or wounds.  No significant lower extremity claudication.  No chest pain or shortness of breath.   Past Medical History:  Diagnosis Date   Cancer ([place])    basal cell both shoulders   Diabetes mellitus    insulin dependant   Hyperlipidemia     Past Surgical History:  Procedure Laterality Date   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   Proximal right great toe   repair of rt knee laceration       Current Outpatient Medications  Medication Sig Dispense Refill   ALPRAZolam (XANAX) 0.25 MG tablet TAKE 1 TABLET BY MOUTH EVERYDAY AT BEDTIME 90 tablet 0   aspirin EC 81 MG tablet Take 1 tablet (81 mg total) by mouth daily. 90 tablet 3   BD PEN NEEDLE NANO 2ND GEN 32G X 4 MM MISC USE 1 PEN NEEDLE DAILY 100 each 2   blood glucose meter kit and supplies KIT Dispense based on patient and insurance preference. ICD-10: E11.9 1 each 0   dapagliflozin  propanediol (FARXIGA) 10 MG TABS tablet Take 1 tablet (10 mg total) by mouth daily before breakfast. 30 tablet 6   gabapentin (NEURONTIN) 100 MG capsule Take 1 capsule (100 mg total) by mouth 3 (three) times daily. 90 capsule 3   glucose blood (ONETOUCH VERIO) test strip Check sugar 4 times daily 400 strip 3   glucose blood (ONETOUCH VERIO) test strip 1 each by Other route in the morning, at noon, in the evening, and at bedtime. Use as instructed 400 each 3   hydrochlorothiazide (HYDRODIURIL) 25 MG tablet TAKE 1 TABLET BY MOUTH EVERY DAY. NEED OFFICE VISIT 90 tablet 1   Insulin Glargine (BASAGLAR KWIKPEN) 100 UNIT/ML Inject 55 Units into the skin daily. 60 mL 3   insulin lispro (HUMALOG KWIKPEN) 200 UNIT/ML KwikPen Max daily 90 units (Patient taking differently: 30 Units with breakfast, with lunch, and with evening meal. Max daily 90 units) 45 mL 3   losartan (COZAAR) 100 MG tablet TAKE 1 TABLET BY MOUTH EVERY DAY 90 tablet 1   metoCLOPramide (REGLAN) 5 MG tablet TAKE 1 TABLET BY MOUTH 3 TIMES A DAY BEFORE MEALS 270 tablet 1   OneTouch Delica Lancets 30Q MISC 1 Device by Does not apply route in the morning, at noon, in the evening, and at bedtime. 400 each 3   pantoprazole (PROTONIX) 40 MG tablet TAKE  1 TABLET BY MOUTH EVERY DAY 90 tablet 3   rosuvastatin (CRESTOR) 20 MG tablet TAKE 1 TABLET BY MOUTH EVERY DAY 90 tablet 1   sildenafil (VIAGRA) 100 MG tablet TAKE 1 TABLET 1 HOUR BEFORE INTERCOURSE 4 tablet 11   collagenase (SANTYL) ointment Apply 1 application topically daily. (Patient not taking: Reported on 11/03/2022) 15 g 0   No current facility-administered medications for this visit.    Allergies:   Sulfa antibiotics    Social History:  The patient  reports that he has quit smoking. He has never used smokeless tobacco. He reports that he does not drink alcohol and does not use drugs.   Family History:  The patient's family history includes Alzheimer's disease in his father and mother;  COPD in his brother; Diabetes in his father and mother; Heart disease in his father; Mental illness in his father.    ROS:  Please see the history of present illness.   Otherwise, review of systems are positive for none.   All other systems are reviewed and negative.    PHYSICAL EXAM: VS:  BP (!) 142/58 (BP Location: Left Arm, Patient Position: Sitting, Cuff Size: Normal)   Pulse 66   Ht _0  (1.702 m)   Wt 156 lb 9.6 oz (71 kg)   SpO2 99%   BMI 24.53 kg/m  , BMI Body mass index is 24.53 kg/m. GEN: Well nourished, well developed, in no acute distress  HEENT: normal  Neck: no JVD, carotid bruits, or masses Cardiac: RRR; no murmurs, rubs, or gallops,no edema  Respiratory:  clear to auscultation bilaterally, normal work of breathing GI: soft, nontender, nondistended, + BS MS: no deformity or atrophy  Skin: warm and dry, no rash Neuro:  Strength and sensation are intact Psych: euthymic mood, full affect Vascular: Femoral pulses normal bilaterally.  Posterior tibial is +1 bilaterally.  Dorsalis pedis is not palpable.   EKG:  EKG is not  ordered today.     Recent Labs: 10/05/2022: ALT 19; BUN 10; Creat 0.75; Hemoglobin 15.2; Platelets 188; Potassium 4.5; Sodium 140    Lipid Panel    Component Value Date/Time   CHOL 156 07/29/2021 0926   TRIG 185 (H) 07/29/2021 0926   HDL 47 07/29/2021 0926   CHOLHDL 3.3 07/29/2021 0926   VLDL 49 (H) 03/25/2017 1056   LDLCALC 81 07/29/2021 0926      Wt Readings from Last 3 Encounters:  11/03/22 156 lb 9.6 oz (71 kg)  10/27/22 161 lb (73 kg)  10/05/22 160 lb 1.9 oz (72.6 kg)           No data to display            ASSESSMENT AND PLAN:  1.  Peripheral arterial disease: The patient has known tibial disease but currently has no claudication or lower extremity ulceration.  He did have right big toe surgery last year but that has healed completely.  Continue low-dose aspirin.    2.  Essential hypertension: Blood pressure is  mildly elevated.  He reports that his blood pressure is usually well controlled and thus I made no changes.  3.  Hyperlipidemia: I reviewed most recent lipid profile done in August which showed an LDL of 81.  Continue treatment with rosuvastatin.  4.  Diabetes mellitus: Gradual improvement with most recent hemoglobin A1c of 7.2.     Disposition:   FU with me in 12 months  Signed,  Kathlyn Sacramento, MD  11/03/2022 10:41 AM  [facility]

## 2022-11-03 NOTE — Patient Instructions (Signed)
Medication Instructions:  No changes *If you need a refill on your cardiac medications before your next appointment, please call your pharmacy*   Lab Work: None ordered If you have labs (blood work) drawn today and your tests are completely normal, you will receive your results only by: MyChart Message (if you have MyChart) OR A paper copy in the mail If you have any lab test that is abnormal or we need to change your treatment, we will call you to review the results.   Testing/Procedures: None ordered   Follow-Up: At [facility], you and your health needs are our priority.  As part of our continuing mission to provide you with exceptional heart care, we have created designated Provider Care Teams.  These Care Teams include your primary Cardiologist (physician) and Advanced Practice Providers (APPs -  Physician Assistants and Nurse Practitioners) who all work together to provide you with the care you need, when you need it.  We recommend signing up for the patient portal called "MyChart".  Sign up information is provided on this After Visit Summary.  MyChart is used to connect with patients for Virtual Visits (Telemedicine).  Patients are able to view lab/test results, encounter notes, upcoming appointments, etc.  Non-urgent messages can be sent to your provider as well.   To learn more about what you can do with MyChart, go to [email or web address].    Your next appointment:   12 month(s)  The format for your next appointment:   In Person  Provider:   Dr. Arida  Important Information About Sugar       

## 2022-11-10 ENCOUNTER — Other Ambulatory Visit: Payer: Medicare HMO | Admitting: Internal Medicine

## 2022-11-10 ENCOUNTER — Other Ambulatory Visit: Payer: Medicare HMO

## 2022-11-10 DIAGNOSIS — E785 Hyperlipidemia, unspecified: Secondary | ICD-10-CM | POA: Diagnosis not present

## 2022-11-10 DIAGNOSIS — E1142 Type 2 diabetes mellitus with diabetic polyneuropathy: Secondary | ICD-10-CM | POA: Diagnosis not present

## 2022-11-10 DIAGNOSIS — Z794 Long term (current) use of insulin: Secondary | ICD-10-CM | POA: Diagnosis not present

## 2022-11-10 DIAGNOSIS — E1165 Type 2 diabetes mellitus with hyperglycemia: Secondary | ICD-10-CM | POA: Diagnosis not present

## 2022-11-11 LAB — MICROALBUMIN / CREATININE URINE RATIO
Creatinine, Urine: 114 mg/dL (ref 20–320)
Microalb Creat Ratio: 57 mcg/mg creat — ABNORMAL HIGH (ref <30)
Microalb, Ur: 6.5 mg/dL

## 2022-11-11 LAB — LIPID PANEL
Cholesterol: 163 mg/dL (ref <200)
HDL: 55 mg/dL (ref >=40)
LDL Cholesterol (Calc): 85 mg/dL (calc)
Non-HDL Cholesterol (Calc): 108 mg/dL (calc) (ref <130)
Total CHOL/HDL Ratio: 3 (calc) (ref <5.0)
Triglycerides: 134 mg/dL (ref <150)

## 2022-11-16 ENCOUNTER — Ambulatory Visit (INDEPENDENT_AMBULATORY_CARE_PROVIDER_SITE_OTHER): Payer: Medicare HMO | Admitting: Internal Medicine

## 2022-11-16 ENCOUNTER — Encounter: Payer: Self-pay | Admitting: Internal Medicine

## 2022-11-16 VITALS — BP 122/68 | HR 64 | Temp 98.6°F | Ht 68.0 in | Wt 157.8 lb

## 2022-11-16 DIAGNOSIS — I491 Atrial premature depolarization: Secondary | ICD-10-CM | POA: Diagnosis not present

## 2022-11-16 DIAGNOSIS — E1165 Type 2 diabetes mellitus with hyperglycemia: Secondary | ICD-10-CM | POA: Diagnosis not present

## 2022-11-16 DIAGNOSIS — E1169 Type 2 diabetes mellitus with other specified complication: Secondary | ICD-10-CM | POA: Diagnosis not present

## 2022-11-16 DIAGNOSIS — R011 Cardiac murmur, unspecified: Secondary | ICD-10-CM

## 2022-11-16 DIAGNOSIS — E785 Hyperlipidemia, unspecified: Secondary | ICD-10-CM | POA: Diagnosis not present

## 2022-11-16 DIAGNOSIS — Z0000 Encounter for general adult medical examination without abnormal findings: Secondary | ICD-10-CM | POA: Diagnosis not present

## 2022-11-16 DIAGNOSIS — E1143 Type 2 diabetes mellitus with diabetic autonomic (poly)neuropathy: Secondary | ICD-10-CM

## 2022-11-16 DIAGNOSIS — Z794 Long term (current) use of insulin: Secondary | ICD-10-CM

## 2022-11-16 DIAGNOSIS — I351 Nonrheumatic aortic (valve) insufficiency: Secondary | ICD-10-CM | POA: Diagnosis not present

## 2022-11-16 DIAGNOSIS — G453 Amaurosis fugax: Secondary | ICD-10-CM

## 2022-11-16 DIAGNOSIS — I10 Essential (primary) hypertension: Secondary | ICD-10-CM

## 2022-11-16 DIAGNOSIS — I499 Cardiac arrhythmia, unspecified: Secondary | ICD-10-CM | POA: Diagnosis not present

## 2022-11-16 DIAGNOSIS — K3184 Gastroparesis: Secondary | ICD-10-CM | POA: Diagnosis not present

## 2022-11-16 LAB — POCT URINALYSIS DIPSTICK
Bilirubin, UA: NEGATIVE
Blood, UA: NEGATIVE
Glucose, UA: POSITIVE — AB
Ketones, UA: NEGATIVE
Leukocytes, UA: NEGATIVE
Nitrite, UA: NEGATIVE
Protein, UA: NEGATIVE
Spec Grav, UA: 1.01 (ref 1.010–1.025)
Urobilinogen, UA: 0.2 E.U./dL
pH, UA: 8.5 — AB (ref 5.0–8.0)

## 2022-11-16 NOTE — Progress Notes (Unsigned)
Annual Wellness Visit     Patient: Joseph Dixon, Male    DOB: 12-30-1950, 71 y.o.   MRN: [id number] Visit Date: 11/16/2022   Subjective    Joseph Dixon is a 71 y.o. male who presents today for his Annual Wellness Visit.  He is also here for health maintenance exam.  Longstanding patient in this practice.  Hx Type 2 diabetes and saw Dr. Kelton Dixon, Endocrinologist recently.  Hemoglobin A1c in October was 7.2% which is a good reading for him.  We saw him in mid October regarding an episode of amaurosis fugax no more episodes of amaurosis fugax.Marland Kitchen  He had been seen at [facility] eye care and told by physician that while pouring coffee, he had seen white spots in his right eye and then a greatest vision disturbance for short time maybe 2 or 3 minutes.  At that time he was watching TV.  He had a echocardiogram in 2016 showing mild to moderate mitral regurgitation.  He has had one episode only and has appt with Dr. Felecia Dixon this week. We ordered an Echocardiogram but it has not been done yet. Scheduled for December 7th at [facility] but [place] be at the beach that [place]. Need to get it done this week if possible. Saw Dr. Jenna Dixon for follow up on peripheral vascular disease and this was stable.  He has a history of peripheral artery disease.  He has a history of hypertension, hyperlipidemia.  He had ulceration of left great toe in 2019 which was slow to heal.  History of cardiac murmur noted in 2016.  2D echocardiogram showed mild to moderate aortic regurgitation.  Mitral valve had calcified annulus.  Right ventricle was mildly dilated he had grade 1 diastolic dysfunction.  Social history: He is a widower.  Does not smoke.  Currently retired and drinks a few beers a month.  He is allergic to sulfa-it causes an adverse reaction.  Intolerant to metformin.  Fractured nose 1996, headache requiring hospitalization 1966, viral meningitis 1970.  Basal cell carcinoma of the left shoulder 2003.  At one point he  had some intermittent nausea and vomiting.  I placed him on Reglan and that seemed to help.  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   Family history: Mother died of complications of hip fracture and had history of diabetes.  Father died at age 42 with complications of Alzheimer's disease, history of pacemaker and diabetes.  1 brother with history of COPD.  1 sister in good health.  Son and daughter in good health.  He saw Dr. Jenna Dixon recently regarding peripheral artery disease but did not mention to him episode of amaurosis fugax in October.    Review of Systems no more TIA symptoms, no chest pain or SOB. Sometimes is aware of arrythmia. Will have Dr. Jenna Dixon check this out.  He was also noted to have irregular rhythm in October and an EKG showed only occasional PACs.  Objective    Vitals: BP 122/68, pulse 58 ,T 98.6 degrees, pulse ox 98%, weight 157 lbs., BMI 23.99  Skin: Warm and dry.  No cervical adenopathy.  No carotid bruits.  Chest clear.  TMs clear.  Cardiac exam: Regular rate and rhythm mostly with occasional extrasystole.  No murmur appreciated.  Abdomen is soft nondistended without hepatosplenomegaly masses or tenderness.  Prostate is normal without nodules.  No pitting edema of the lower extremities.  Affect thought and judgment appear to be normal and there  are  no gross focal deficits on brief neurological exam today    Most recent fall risk assessment:    10/05/2022   10:22 AM  [place] in the past year? 0  Number falls in past yr: 0  Injury with Fall? 0  Risk for fall due to : No Fall Risks  Follow up Falls evaluation completed    Most recent depression screenings:    10/05/2022   10:28 AM 07/31/2021   11:09 AM  PHQ 2/9 Scores  PHQ - 2 Score 0 0   Most recent cognitive screening:     No data to display             Assessment & Plan   Episode of amaurosis fugax in October and has appointment to see neurology  Referral back to  cardiology to evaluate for possible arrhythmia with history of amaurosis fugax  Type 2 diabetes mellitus followed by Dr. Kelton Dixon and hemoglobin A1c 7.2%  History of diabetic foot ulcer November 2022.  Seen by podiatrist and debridement performed.  History of diabetic neuropathy  History of diabetic ulcer left great toe 2019 which was slow to heal.  However at that time he had near normal ABIs in left leg.  Hypertension-stable  Hyperlipidemia-stable  Plan: He has appointment to see Dr. Nancy Dixon later this week.  He is being referred back to cardiology to evaluate for possible arrhythmia with history of recent visual disturbance.  Continue follow-up with Dr. Rosezella Dixon.  Encouraged him to keep diabetic control tight.  Continue meds for hypertension and hyperlipidemia.    Annual wellness visit done today including the all of the following: Reviewed patient's Family Medical History Reviewed and updated list of patient's medical providers Assessment of cognitive impairment was done Assessed patient's functional ability Established a written schedule for health screening [place] Completed and Reviewed  Discussed health benefits of physical activity, and encouraged him to engage in regular exercise appropriate for his age and condition.         {I, Joseph Showers, MD, have reviewed all documentation for this visit. The documentation on 11/16/22 for the exam, diagnosis, procedures, and orders are all accurate and complete.  Joseph Dixon, CMA

## 2022-11-16 NOTE — Patient Instructions (Signed)
An Echo of heart was ordered but has not been done. We will call about this

## 2022-11-18 ENCOUNTER — Ambulatory Visit (HOSPITAL_COMMUNITY)
Admission: RE | Admit: 2022-11-18 | Discharge: 2022-11-18 | Disposition: A | Discharge status: Home / Self Care | Payer: Medicare HMO | Source: Ambulatory Visit | Attending: Internal Medicine | Admitting: Internal Medicine

## 2022-11-18 DIAGNOSIS — R011 Cardiac murmur, unspecified: Secondary | ICD-10-CM

## 2022-11-18 DIAGNOSIS — I080 Rheumatic disorders of both mitral and aortic valves: Secondary | ICD-10-CM | POA: Insufficient documentation

## 2022-11-18 LAB — ECHOCARDIOGRAM COMPLETE
AR max vel: 1.83 cm2
AV Area VTI: 1.95 cm2
AV Area mean vel: 1.64 cm2
AV Mean grad: 11.8 mmHg
AV Peak grad: 18.8 mmHg
Ao pk vel: 2.17 m/s
Area-P 1/2: 3.42 cm2
Calc EF: 58.3 %
MV M vel: 3.86 m/s
MV Peak grad: 59.6 mmHg
P 1/2 time: 754 msec
S' Lateral: 2.9 cm
Single Plane A2C EF: 57.8 %
Single Plane A4C EF: 59.7 %

## 2022-11-18 NOTE — Progress Notes (Signed)
  Echocardiogram 2D Echocardiogram has been performed.  Joseph Dixon 11/18/2022, 8:54 AM

## 2022-11-18 NOTE — Progress Notes (Unsigned)
[facility]  PATIENT: Joseph Dixon DOB: 04/11/1951  REFERRING DOCTOR OR PCP:  Tedra Senegal, MD. SOURCE: Patient, notes from primary care, cardiology.  Brain report, lab reports.    _________________________________   HISTORICAL  CHIEF COMPLAINT:  Chief Complaint  Patient presents with   New Patient (Initial Visit)    Pt in 2 Pt here for Amaurosis fugax consult Pt states blurry and double vision Pt states having  increased headaches Pt states headache pain in  both eyes Pt states increased pain in right eye  Pt states symptoms started 2 months ago     HISTORY OF PRESENT ILLNESS:  I had the pleasure of seeing your patient, Joseph Dixon, at [facility] for neurologic consultation regarding his visual disturbance.  He is a 71 yo man with Type 2 IDDM, PAD, HTN, hyperlipidemia who has had visual disturbances over the last 2 months..    In September, he had an episode where the right eye suddenly lost vision with a graying of vision while watching TV that lasted 3 to 4 minutes.  Over the last month or so, he has had more episodes of visual disturbance.   When he wakes up, he notes a cloudy spot in his vision with hazy fog.  This improves as the morning goes on.    While driving, he notes vision worsens become blurry and doubled.    He pulls and closes his eyes over when that happens and about 5-10 minutes later he feels it has resolved.    This sometimes also occurs while watching TV but is better within a couple minutes.    Sometimes blood sugar has ben low when one of these episodes occur.  He reports in elementary school he had migranes with severe headaches associated  with nausea and vomiting.     The migraines improed as a teenager and he went through most of his life without many HA.    More recently, He gets headaches with pain behind both eyes, right > left now.  He saw ophthalmology after the episode in September.  He was told he may have had  a 'ministroke'.   He reports he was noted to have an irregular heart beat  He had an EKG 10/05/2022 showing NSR with a PAC.  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  The aortic valve is calcified and there was mild aortic valve regurgitation and mild aortic valve stenosis.   REVIEW OF SYSTEMS: Constitutional: No fevers, chills, sweats, or change in appetite Eyes: No visual changes, double vision, eye pain Ear, nose and throat: No hearing loss, ear pain, nasal congestion, sore throat Cardiovascular: No chest pain, palpitations Respiratory:  No shortness of breath at rest or with exertion.   No wheezes GastrointestinaI: No nausea, vomiting, diarrhea, abdominal pain, fecal incontinence Genitourinary:  No dysuria, urinary retention or frequency.  No nocturia. Musculoskeletal:  No neck pain, back pain Integumentary: No rash, pruritus, skin lesions Neurological: as above Psychiatric: No depression at this time.  No anxiety Endocrine: No palpitations, diaphoresis, change in appetite, change in weigh or increased thirst Hematologic/Lymphatic:  No anemia, purpura, petechiae. Allergic/Immunologic: No itchy/runny eyes, nasal congestion, recent allergic reactions, rashes  ALLERGIES: Allergies  Allergen Reactions   Sulfa  Antibiotics     hallucinations    HOME MEDICATIONS:  Current Outpatient Medications:    ALPRAZolam (XANAX) 0.25 MG tablet, TAKE 1 TABLET BY MOUTH EVERYDAY AT BEDTIME (Patient taking differently: as needed.), Disp: 90 tablet, Rfl: 0   aspirin EC 81 MG tablet, Take 1 tablet (81 mg total) by mouth daily., Disp: 90 tablet, Rfl: 3   BD PEN NEEDLE NANO 2ND GEN 32G X 4 MM MISC, USE 1 PEN NEEDLE DAILY,  Disp: 100 each, Rfl: 2   blood glucose meter kit and supplies KIT, Dispense based on patient and insurance preference. ICD-10: E11.9, Disp: 1 each, Rfl: 0   dapagliflozin propanediol (FARXIGA) 10 MG TABS tablet, Take 1 tablet (10 mg total) by mouth daily before breakfast., Disp: 30 tablet, Rfl: 6   glucose blood (ONETOUCH VERIO) test strip, Check sugar 4 times daily, Disp: 400 strip, Rfl: 3   glucose blood (ONETOUCH VERIO) test strip, 1 each by Other route in the morning, at noon, in the evening, and at bedtime. Use as instructed, Disp: 400 each, Rfl: 3   hydrochlorothiazide (HYDRODIURIL) 25 MG tablet, TAKE 1 TABLET BY MOUTH EVERY DAY. NEED OFFICE VISIT, Disp: 90 tablet, Rfl: 1   Insulin Glargine (BASAGLAR KWIKPEN) 100 UNIT/ML, Inject 55 Units into the skin daily., Disp: 60 mL, Rfl: 3   insulin lispro (HUMALOG KWIKPEN) 200 UNIT/ML KwikPen, Max daily 90 units (Patient taking differently: 30 Units with breakfast, with lunch, and with evening meal. Max daily 90 units), Disp: 45 mL, Rfl: 3   losartan (COZAAR) 100 MG tablet, TAKE 1 TABLET BY MOUTH EVERY DAY, Disp: 90 tablet, Rfl: 1   OneTouch Delica Lancets 40H MISC, 1 Device by Does not apply route in the morning, at noon, in the evening, and at bedtime., Disp: 400 each, Rfl: 3   pantoprazole (PROTONIX) 40 MG tablet, TAKE 1 TABLET BY MOUTH EVERY DAY, Disp: 90 tablet, Rfl: 3   rosuvastatin (CRESTOR) 20 MG tablet, TAKE 1 TABLET BY MOUTH EVERY DAY, Disp: 90 tablet, Rfl: 1   collagenase (SANTYL) ointment, Apply 1 application topically daily., Disp: 15 g, Rfl: 0   gabapentin (NEURONTIN) 100 MG capsule, Take 1 capsule (100 mg total) by mouth 3 (three) times daily., Disp: 90 capsule, Rfl: 3   metoCLOPramide (REGLAN) 5 MG tablet, TAKE 1 TABLET BY MOUTH 3 TIMES A DAY BEFORE MEALS, Disp: 270 tablet, Rfl: 1   sildenafil (VIAGRA) 100 MG tablet, TAKE 1 TABLET 1 HOUR BEFORE INTERCOURSE, Disp: 4 tablet, Rfl: 11  PAST MEDICAL HISTORY: Past Medical History:  Diagnosis  Date   Cancer ([place])    basal cell both shoulders   Diabetes mellitus    insulin dependant   Hyperlipidemia     PAST SURGICAL HISTORY: Past Surgical History:  Procedure Laterality Date   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   Proximal right great toe   repair of rt knee laceration      FAMILY HISTORY: Family History  Problem Relation Age of Onset   Heart disease Father    Diabetes Father    Mental illness Father    Alzheimer's disease Father    COPD Brother    Diabetes Mother    Alzheimer's disease Mother    Colon cancer Neg Hx     SOCIAL HISTORY: Social History   Socioeconomic History   Marital status: Married    Spouse name: Not on file   Number of children: Not on file   Years of education: Not on file  Highest education level: Not on file  Occupational History   Not on file  Tobacco Use   Smoking status: Former   Smokeless tobacco: Never   Tobacco comments:    quit 10 years ago  Substance and Sexual Activity   Alcohol use: Yes    Comment: beer 1 monthly   Drug use: No   Sexual activity: Not on file  Other Topics Concern   Not on file  Social History Narrative   Not on file   Social Determinants of Health   Financial Resource Strain: Not on file  Food Insecurity: Not on file  Transportation Needs: Not on file  Physical Activity: Not on file  Stress: Not on file  Social Connections: Not on file  Intimate Partner Violence: Not on file       PHYSICAL EXAM  Vitals:   11/19/22 1041  BP: (!) 150/65  Pulse: 76  Weight: 160 lb 8 oz (72.8 kg)  Height: _0  (1.727 m)    Body mass index is 24.4 kg/m.   General: The patient is well-developed and well-nourished and in no acute distress  HEENT:  Head is [place]/AT.  Sclera are anicteric.  Funduscopic exam shows normal optic discs and retinal vessels.  Neck: Pulses were normal.  There is a bruit on the left..  The neck is nontender.  Good range of motion.  Cardiovascular: The heart has a  regular rate and rhythm with a normal S1 and S2.  There is a systolic murmur  Skin: Extremities are without rash or  edema.  Musculoskeletal:  Back is nontender  Neurologic Exam  Mental status: The patient is alert and oriented x 3 at the time of the examination. The patient has apparent normal recent and remote memory, with an apparently normal attention span and concentration ability.   Speech is normal.  Cranial nerves: Extraocular movements are full. Pupils are equal, round, and reactive to light and accomodation.  Visual fields are full.  Facial symmetry is present. There is good facial sensation to soft touch bilaterally.Facial strength is normal.  Trapezius and sternocleidomastoid strength is normal. No dysarthria is noted.  The tongue is midline, and the patient has symmetric elevation of the soft palate. No obvious hearing deficits are noted.  Motor:  Muscle bulk is normal.   Tone is normal. Strength is  5 / 5 in all 4 extremities.   Sensory: Sensory testing is intact to pinprick, soft touch and vibration sensation in The arms and proximal legs.  He had reduced vibration sensation at the toes.  Coordination: Cerebellar testing reveals good finger-nose-finger and heel-to-shin bilaterally.  Gait and station: Station is normal.   Gait is normal. Tandem gait is mildly wide. Romberg is negative.   Reflexes: Deep tendon reflexes are symmetric and normal in arms and 1 at knees, trace ankles.   Plantar responses are flexor.    DIAGNOSTIC DATA (LABS, IMAGING, TESTING) - I reviewed patient records, labs, notes, testing and imaging myself where available.  Lab Results  Component Value Date   WBC 7.1 10/05/2022   HGB 15.2 10/05/2022   HCT 44.3 10/05/2022   MCV 87.5 10/05/2022   PLT 188 10/05/2022      Component Value Date/Time   NA 140 10/05/2022 1103   K 4.5 10/05/2022 1103   CL 103 10/05/2022 1103   CO2 26 10/05/2022 1103   GLUCOSE 156 (H) 10/05/2022 1103   BUN 10 10/05/2022  1103   CREATININE 0.75 10/05/2022 1103  CALCIUM 9.5 10/05/2022 1103   PROT 6.7 10/05/2022 1103   ALBUMIN 4.2 03/25/2017 1056   AST 18 10/05/2022 1103   ALT 19 10/05/2022 1103   ALKPHOS 62 03/25/2017 1056   BILITOT 0.4 10/05/2022 1103   GFRNONAA 79 07/25/2020 0924   GFRAA 92 07/25/2020 0924   Lab Results  Component Value Date   CHOL 163 11/10/2022   HDL 55 11/10/2022   LDLCALC 85 11/10/2022   TRIG 134 11/10/2022   CHOLHDL 3.0 11/10/2022   Lab Results  Component Value Date   HGBA1C 7.2 (H) 10/05/2022   Lab Results  Component Value Date   GEXBMWUX32 440 04/30/2016   No results found for: "TSH"     ASSESSMENT AND PLAN  TIA (transient ischemic attack) - Plan: VAS US CAROTID, MR BRAIN WO CONTRAST, MR ANGIO HEAD WO CONTRAST  Visual disturbance - Plan: MR BRAIN WO CONTRAST  Diabetic polyneuropathy associated with type 2 diabetes mellitus (HCC)  PAD (peripheral artery disease) ([place])    In summary, Joseph Dixon is a 71 year old man who had a visual event on the right about 2 months ago most consistent with a transient ischemic attack.  Since then he has also had other episodes of visual disturbance some with milder loss of vision and some with diplopia.  These are  more difficult to characterize.   His examination showed a bruit on the left.  He does have some numbness in the feet consistent with mild diabetic polyneuropathy.   We will check a carotid Doppler study.  Additionally I will check an MRI of the brain and MR angiogram of the head to determine if he has had previous vascular events or significant stenosis.  He is on aspirin 81 mg and is advised to continue this.  If critical stenosis is noted, he may need referral for a procedure.  He will return to see me as needed for new or worsening neurologic symptoms.  Thank you for asking me to see Joseph Dixon.  Please let me know if I can be of further assistance with him or other patients in the future.      A.  Felecia Shelling, MD, [facility] 10/17/2535, 64:40 AM Certified in Neurology, Clinical Neurophysiology, Sleep Medicine and Neuroimaging  [facility] [address] [place], [place] [address] [phone]

## 2022-11-19 ENCOUNTER — Ambulatory Visit: Payer: Medicare HMO | Admitting: Neurology

## 2022-11-19 ENCOUNTER — Encounter: Payer: Self-pay | Admitting: Neurology

## 2022-11-19 ENCOUNTER — Telehealth: Payer: Self-pay | Admitting: Neurology

## 2022-11-19 VITALS — BP 150/65 | HR 76 | Ht 68.0 in | Wt 160.5 lb

## 2022-11-19 DIAGNOSIS — H539 Unspecified visual disturbance: Secondary | ICD-10-CM | POA: Diagnosis not present

## 2022-11-19 DIAGNOSIS — G459 Transient cerebral ischemic attack, unspecified: Secondary | ICD-10-CM

## 2022-11-19 DIAGNOSIS — E1142 Type 2 diabetes mellitus with diabetic polyneuropathy: Secondary | ICD-10-CM | POA: Diagnosis not present

## 2022-11-19 DIAGNOSIS — I739 Peripheral vascular disease, unspecified: Secondary | ICD-10-CM | POA: Diagnosis not present

## 2022-11-19 NOTE — Telephone Encounter (Signed)
[employer] medicare sent to [facility] they obtain auth [id number] 

## 2022-11-24 ENCOUNTER — Ambulatory Visit: Payer: Medicare HMO | Attending: Cardiovascular Disease | Admitting: Cardiovascular Disease

## 2022-11-24 ENCOUNTER — Ambulatory Visit: Payer: Medicare HMO | Attending: Cardiovascular Disease

## 2022-11-24 ENCOUNTER — Other Ambulatory Visit: Payer: Self-pay | Admitting: Cardiovascular Disease

## 2022-11-24 ENCOUNTER — Encounter: Payer: Self-pay | Admitting: Cardiovascular Disease

## 2022-11-24 VITALS — BP 130/60 | HR 74 | Ht 68.0 in | Wt 156.6 lb

## 2022-11-24 DIAGNOSIS — I10 Essential (primary) hypertension: Secondary | ICD-10-CM

## 2022-11-24 DIAGNOSIS — I499 Cardiac arrhythmia, unspecified: Secondary | ICD-10-CM

## 2022-11-24 DIAGNOSIS — I480 Paroxysmal atrial fibrillation: Secondary | ICD-10-CM

## 2022-11-24 DIAGNOSIS — I739 Peripheral vascular disease, unspecified: Secondary | ICD-10-CM

## 2022-11-24 DIAGNOSIS — E785 Hyperlipidemia, unspecified: Secondary | ICD-10-CM | POA: Diagnosis not present

## 2022-11-24 DIAGNOSIS — G459 Transient cerebral ischemic attack, unspecified: Secondary | ICD-10-CM

## 2022-11-24 DIAGNOSIS — R0989 Other specified symptoms and signs involving the circulatory and respiratory systems: Secondary | ICD-10-CM

## 2022-11-24 DIAGNOSIS — I491 Atrial premature depolarization: Secondary | ICD-10-CM

## 2022-11-24 NOTE — Patient Instructions (Signed)
Medication Instructions:  No changes *If you need a refill on your cardiac medications before your next appointment, please call your pharmacy*   Lab Work: None ordered If you have labs (blood work) drawn today and your tests are completely normal, you will receive your results only by: [place] (if you have MyChart) OR A paper copy in the mail If you have any lab test that is abnormal or we need to change your treatment, we will call you to review the results.   Testing/Procedures: None ordered   Follow-Up: At [facility], you and your health needs are our priority.  As part of our continuing mission to provide you with exceptional heart care, we have created designated Provider Care Teams.  These Care Teams include your primary Cardiologist (physician) and Advanced Practice Providers (APPs -  Physician Assistants and Nurse Practitioners) who all work together to provide you with the care you need, when you need it.  We recommend signing up for the patient portal called "MyChart".  Sign up information is provided on this After Visit Summary.  MyChart is used to connect with patients for Virtual Visits (Telemedicine).  Patients are able to view lab/test results, encounter notes, upcoming appointments, etc.  Non-urgent messages can be sent to your provider as well.   To learn more about what you can do with MyChart, go to [email or web address].    Your next appointment:   3 month(s)  The format for your next appointment:   In Person  Provider:   Dr. Fletcher Anon Other Instructions [place] Monitor Instructions  Your physician has requested you wear a ZIO patch monitor for 14 days.  This is a single patch monitor. [employer] supplies one patch monitor per enrollment. Additional stickers are not available. Please do not apply patch if you will be having a Nuclear Stress Test,  Echocardiogram, Cardiac CT, MRI, or Chest Xray during the period you would be wearing the   monitor. The patch cannot be worn during these tests. You cannot remove and re-apply the  ZIO XT patch monitor.  Your ZIO patch monitor will be mailed 3 day USPS to your address on file. It may take 3-5 days  to receive your monitor after you have been enrolled.  Once you have received your monitor, please review the enclosed instructions. Your monitor  has already been registered assigning a specific monitor serial # to you.  Billing and Patient Assistance Program Information  We have supplied [employer] with any of your insurance information on file for billing purposes. [employer] offers a sliding scale Patient Assistance Program for patients that do not have  insurance, or whose insurance does not completely cover the cost of the ZIO monitor.  You must apply for the Patient Assistance Program to qualify for this discounted rate.  To apply, please call [employer] at [phone], select option 4, select option 2, ask to apply for  Patient Assistance Program. Theodore Demark will ask your household income, and how many people  are in your household. They will quote your out-of-pocket cost based on that information.  [employer] will also be able to set up a 35-month interest-free payment plan if needed.  Applying the monitor   Shave hair from upper left chest.  Hold abrader disc by orange tab. Rub abrader in 40 strokes over the upper left chest as  indicated in your monitor instructions.  Clean area with 4 enclosed alcohol pads. Let dry.  Apply patch as indicated in monitor instructions. Patch  will be placed under collarbone on left  side of chest with arrow pointing upward.  Rub patch adhesive wings for 2 minutes. Remove white label marked "1". Remove the white  label marked "2". Rub patch adhesive wings for 2 additional minutes.  While looking in a mirror, press and release button in center of patch. A small green light will  flash 3-4 times. This will be your only indicator that the monitor has been  turned on.  Do not shower for the first 24 hours. You may shower after the first 24 hours.  Press the button if you feel a symptom. You will hear a small click. Record Date, Time and  Symptom in the Patient Logbook.  When you are ready to remove the patch, follow instructions on the last 2 pages of Patient  Logbook. Stick patch monitor onto the last page of Patient Logbook.  Place Patient Logbook in the blue and white box. Use locking tab on box and tape box closed  securely. The blue and white box has prepaid postage on it. Please place it in the mailbox as  soon as possible. Your physician should have your test results approximately 7 days after the  monitor has been mailed back to [facility].  Call [place] at [phone] if you have questions regarding  your ZIO XT patch monitor. Call them immediately if you see an orange light blinking on your  monitor.  If your monitor falls off in less than 4 days, contact our Monitor department at [phone].  If your monitor becomes loose or falls off after 4 days call [employer] at [phone] for  suggestions on securing your monitor

## 2022-11-24 NOTE — Progress Notes (Unsigned)
Enrolled for [employer] to mail a ZIO XT long term holter monitor to the patients address on file.  

## 2022-11-24 NOTE — Progress Notes (Signed)
Cardiology Office Note   Date:  11/24/2022   ID:  Joseph Dixon, DOB 27-Apr-1951, MRN [id number]  PCP:  Elby Showers, MD  Cardiologist:   Kathlyn Sacramento, MD   No chief complaint on file.      History of Present Illness: Joseph Dixon is a 71 y.o. male who is here today  for a follow-up visit regarding peripheral arterial disease.  The patient has known history of diabetes mellitus, hypertension and hyperlipidemia.   He is not a smoker and has no family history of premature coronary artery disease. The patient was seen in 2019 for a slow healing small ulceration on the left big toe. He underwent noninvasive vascular evaluation which showed near normal ABI.  Duplex showed mainly tibial disease with an occluded anterior tibial artery on the right and an occluded anterior tibial and peroneal arteries on the left.  The ulceration healed without need for revascularization. 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    He was seen recently by neurology for suspected TIA.  In September, he had sudden loss of vision in the right eye that lasted for about 3 to 4 minutes.  This was followed by episodes of visual disturbance.  While driving, he notes blurry and double visions on occasions.  He was evaluated by ophthalmology and was told that he likely had a mini stroke.  He saw neurology recently and he is scheduled to have MRI of the brain as well as carotid Doppler.  He was noted to have irregular heartbeat and there has been a concern about possible paroxysmal atrial fibrillation.  He denies chest pain or shortness of breath and he remains active with no exertional symptoms. He had an echocardiogram done last week which showed normal LV systolic function with mild aortic stenosis.  Past Medical History:  Diagnosis Date   Cancer ([place])    basal cell both shoulders   Diabetes mellitus    insulin dependant   Hyperlipidemia      Past Surgical History:  Procedure Laterality Date   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   Proximal right great toe   repair of rt knee laceration       Current Outpatient Medications  Medication Sig Dispense Refill   ALPRAZolam (XANAX) 0.25 MG tablet TAKE 1 TABLET BY MOUTH EVERYDAY AT BEDTIME (Patient taking differently: as needed.) 90 tablet 0   aspirin EC 81 MG tablet Take 1 tablet (81 mg total) by mouth daily. 90 tablet 3   BD PEN NEEDLE NANO 2ND GEN 32G X 4 MM MISC USE 1 PEN NEEDLE DAILY 100 each 2   blood glucose meter kit and supplies KIT Dispense based on patient and insurance preference. ICD-10: E11.9 1 each 0   dapagliflozin propanediol (FARXIGA) 10 MG TABS tablet Take 1 tablet (10 mg total) by mouth daily before breakfast. 30 tablet 6   gabapentin (NEURONTIN) 100 MG capsule Take 1 capsule (100 mg total) by mouth 3 (three) times daily. 90 capsule 3   glucose blood (ONETOUCH VERIO) test strip Check sugar 4 times daily 400 strip 3   glucose blood (ONETOUCH VERIO) test strip 1 each by Other route in the morning, at noon, in the evening, and at bedtime. Use as instructed 400 each 3   hydrochlorothiazide (HYDRODIURIL) 25 MG tablet TAKE 1 TABLET BY MOUTH EVERY DAY. NEED OFFICE VISIT 90 tablet 1   Insulin Glargine [facility])  100 UNIT/ML Inject 55 Units into the skin daily. 60 mL 3   insulin lispro (HUMALOG KWIKPEN) 200 UNIT/ML KwikPen Max daily 90 units (Patient taking differently: 30 Units with breakfast, with lunch, and with evening meal. Max daily 90 units) 45 mL 3   losartan (COZAAR) 100 MG tablet TAKE 1 TABLET BY MOUTH EVERY DAY 90 tablet 1   metoCLOPramide (REGLAN) 5 MG tablet TAKE 1 TABLET BY MOUTH 3 TIMES A DAY BEFORE MEALS 270 tablet 1   OneTouch Delica Lancets 40C MISC 1 Device by Does not apply route in the morning, at noon, in the evening, and at bedtime. 400 each 3   pantoprazole (PROTONIX) 40 MG tablet TAKE 1 TABLET BY MOUTH EVERY DAY 90 tablet 3    rosuvastatin (CRESTOR) 20 MG tablet TAKE 1 TABLET BY MOUTH EVERY DAY 90 tablet 1   sildenafil (VIAGRA) 100 MG tablet TAKE 1 TABLET 1 HOUR BEFORE INTERCOURSE 4 tablet 11   No current facility-administered medications for this visit.    Allergies:   Sulfa antibiotics    Social History:  The patient  reports that he has quit smoking. He has never used smokeless tobacco. He reports current alcohol use. He reports that he does not use drugs.   Family History:  The patient's family history includes Alzheimer's disease in his father and mother; COPD in his brother; Diabetes in his father and mother; Heart disease in his father; Mental illness in his father.    ROS:  Please see the history of present illness.   Otherwise, review of systems are positive for none.   All other systems are reviewed and negative.    PHYSICAL EXAM: VS:  BP 130/60   Pulse 74   Ht _0  (1.727 m)   Wt 156 lb 9.6 oz (71 kg)   BMI 23.81 kg/m  , BMI Body mass index is 23.81 kg/m. GEN: Well nourished, well developed, in no acute distress  HEENT: normal  Neck: no JVD,  or masses.  Bilateral carotid bruits louder on the left side Cardiac: RRR; no rubs, or gallops,no edema .  2 out of 6 systolic murmur in the aortic area which is early peaking. Respiratory:  clear to auscultation bilaterally, normal work of breathing GI: soft, nontender, nondistended, + BS MS: no deformity or atrophy  Skin: warm and dry, no rash Neuro:  Strength and sensation are intact Psych: euthymic mood, full affect Vascular: Femoral pulses normal bilaterally.  Posterior tibial is +1 bilaterally.  Dorsalis pedis is not palpable.   EKG:  EKG is  ordered today.  EKG showed normal sinus rhythm with nonspecific T wave changes.  Isolated Q-wave in lead III does not represented inferior MI.   Recent Labs: 10/05/2022: ALT 19; BUN 10; Creat 0.75; Hemoglobin 15.2; Platelets 188; Potassium 4.5; Sodium 140    Lipid Panel    Component Value  Date/Time   CHOL 163 11/10/2022 0929   TRIG 134 11/10/2022 0929   HDL 55 11/10/2022 0929   CHOLHDL 3.0 11/10/2022 0929   VLDL 49 (H) 03/25/2017 1056   LDLCALC 85 11/10/2022 0929      Wt Readings from Last 3 Encounters:  11/24/22 156 lb 9.6 oz (71 kg)  11/19/22 160 lb 8 oz (72.8 kg)  11/16/22 157 lb 12.8 oz (71.6 kg)           No data to display            ASSESSMENT AND PLAN:  1.  Peripheral arterial  disease: The patient has known tibial disease but currently has no claudication or lower extremity ulceration.  He did have right big toe surgery last year but that has healed completely.  Continue low-dose aspirin.    2.  Recent amaurosis fugax: Given bilateral carotid bruits: Suspect underlying carotid disease as the culprit.  He is already scheduled for carotid Doppler later this week. We also have to exclude underlying paroxysmal atrial fibrillation.  I requested 2 weeks ZIO monitor.  Continue aspirin 81 mg once daily.  3.  Aortic stenosis: This was mild on most recent echocardiogram and can be followed with surveillance every 2 to 3 years.  4.  Essential hypertension: Blood pressure is controlled.  5.  Hyperlipidemia: Currently on rosuvastatin 20 mg once daily.  Most recent lipid profile showed an LDL of 85.  Consider adding ezetimibe to get his LDL below 70.  4.  Diabetes mellitus: Gradual improvement with most recent hemoglobin A1c of 7.2.     Disposition:   FU with me in 3 months  Signed,  Kathlyn Sacramento, MD  11/24/2022 10:15 AM    [place]

## 2022-11-27 ENCOUNTER — Ambulatory Visit (HOSPITAL_COMMUNITY)
Admission: RE | Admit: 2022-11-27 | Discharge: 2022-11-27 | Disposition: A | Discharge status: Home / Self Care | Payer: Medicare HMO | Source: Ambulatory Visit | Attending: Neurology | Admitting: Neurology

## 2022-11-27 DIAGNOSIS — G459 Transient cerebral ischemic attack, unspecified: Secondary | ICD-10-CM | POA: Diagnosis not present

## 2022-12-01 DIAGNOSIS — I491 Atrial premature depolarization: Secondary | ICD-10-CM

## 2022-12-01 DIAGNOSIS — G459 Transient cerebral ischemic attack, unspecified: Secondary | ICD-10-CM

## 2022-12-01 DIAGNOSIS — I499 Cardiac arrhythmia, unspecified: Secondary | ICD-10-CM

## 2022-12-01 DIAGNOSIS — I480 Paroxysmal atrial fibrillation: Secondary | ICD-10-CM | POA: Diagnosis not present

## 2022-12-03 ENCOUNTER — Telehealth: Payer: Self-pay | Admitting: *Deleted

## 2022-12-03 NOTE — Telephone Encounter (Signed)
-----   Message from Britt Bottom, MD sent at 12/02/2022  8:55 PM EST ----- Please let him know that there was some narrowing of the arteries going to the brain but not enough to recommend any procedure or surgery.  He should continue to take the aspirin.

## 2022-12-03 NOTE — Telephone Encounter (Signed)
LVM for pt about results per Dr. Garth Bigness note. Advised him to call back if he has any further questions.

## 2022-12-05 ENCOUNTER — Ambulatory Visit
Admission: RE | Admit: 2022-12-05 | Discharge: 2022-12-05 | Disposition: A | Discharge status: Home / Self Care | Payer: Medicare HMO | Source: Ambulatory Visit | Attending: Neurology | Admitting: Neurology

## 2022-12-05 DIAGNOSIS — G459 Transient cerebral ischemic attack, unspecified: Secondary | ICD-10-CM

## 2022-12-05 DIAGNOSIS — H539 Unspecified visual disturbance: Secondary | ICD-10-CM

## 2022-12-08 DIAGNOSIS — L821 Other seborrheic keratosis: Secondary | ICD-10-CM | POA: Diagnosis not present

## 2022-12-08 DIAGNOSIS — L570 Actinic keratosis: Secondary | ICD-10-CM | POA: Diagnosis not present

## 2022-12-08 DIAGNOSIS — Z85828 Personal history of other malignant neoplasm of skin: Secondary | ICD-10-CM | POA: Diagnosis not present

## 2022-12-18 DIAGNOSIS — I480 Paroxysmal atrial fibrillation: Secondary | ICD-10-CM | POA: Diagnosis not present

## 2022-12-18 DIAGNOSIS — G459 Transient cerebral ischemic attack, unspecified: Secondary | ICD-10-CM | POA: Diagnosis not present

## 2022-12-25 ENCOUNTER — Telehealth: Payer: Self-pay | Admitting: *Deleted

## 2022-12-25 ENCOUNTER — Encounter: Payer: Self-pay | Admitting: *Deleted

## 2022-12-25 DIAGNOSIS — I4729 Other ventricular tachycardia: Secondary | ICD-10-CM

## 2022-12-25 DIAGNOSIS — I471 Supraventricular tachycardia, unspecified: Secondary | ICD-10-CM

## 2022-12-25 NOTE — Telephone Encounter (Signed)
-----   Message from Wellington Hampshire, MD sent at 12/18/2022  3:26 PM EST ----- Inform patient that outpatient monitor did not show any atrial fibrillation.  However, he did have short runs of supraventricular tachycardia which is not a serious arrhythmia as well as frequent episodes of ventricular tachycardia which can occasionally indicate the presence of ischemic heart disease. Recommend evaluation with a Lexiscan Myoview to evaluate his ventricular tachycardia.  Follow-up with me as planned.  Cc:Dr. Renold Genta

## 2022-12-25 NOTE — Telephone Encounter (Signed)
Patient made aware of results and verbalized understanding. Letter of instructions have been mailed to the patient.

## 2022-12-31 ENCOUNTER — Telehealth: Payer: Self-pay | Admitting: Cardiovascular Disease

## 2022-12-31 NOTE — Telephone Encounter (Signed)
Patient stated he would like to confirm his MYOCARDIAL PERFUSION test scheduled for 1/15 has been approved by his insurance.

## 2022-12-31 NOTE — Telephone Encounter (Signed)
Spoke to patient stated he was calling to see if his insurance will cover Leane Call that is scheduled Mon 01/04/23.Advised he will need to call his insurance to see if they will cover.

## 2023-01-01 ENCOUNTER — Telehealth (HOSPITAL_COMMUNITY): Payer: Self-pay | Admitting: *Deleted

## 2023-01-01 NOTE — Telephone Encounter (Signed)
Left message on voicemail per DPR in reference to upcoming appointment scheduled on 01/04/2023 at 8:00 with detailed instructions given per Myocardial Perfusion Study Information Sheet for the test. LM to arrive 15 minutes early, and that it is imperative to arrive on time for appointment to keep from having the test rescheduled. If you need to cancel or reschedule your appointment, please call the office within 24 hours of your appointment. Failure to do so may result in a cancellation of your appointment, and a $50 no show fee. Phone number given for call back for any questions.

## 2023-01-04 ENCOUNTER — Ambulatory Visit (HOSPITAL_COMMUNITY): Payer: Medicare HMO | Attending: Cardiovascular Disease

## 2023-01-04 DIAGNOSIS — I471 Supraventricular tachycardia, unspecified: Secondary | ICD-10-CM | POA: Diagnosis not present

## 2023-01-04 DIAGNOSIS — R9439 Abnormal result of other cardiovascular function study: Secondary | ICD-10-CM

## 2023-01-04 LAB — MYOCARDIAL PERFUSION IMAGING
LV dias vol: 112 mL (ref 62–150)
LV sys vol: 60 mL
Nuc Stress EF: 46 %
Peak HR: 73 {beats}/min
Rest HR: 62 {beats}/min
Rest Nuclear Isotope Dose: 10.7 mCi
SDS: 6
SRS: 7
SSS: 13
ST Depression (mm): 0 mm
Stress Nuclear Isotope Dose: 32.9 mCi
TID: 1

## 2023-01-04 MED ORDER — TECHNETIUM TC 99M TETROFOSMIN IV KIT
32.9000 | PACK | Freq: Once | INTRAVENOUS | Status: AC | PRN
  Administered 2023-01-04: 32.9 via INTRAVENOUS

## 2023-01-04 MED ORDER — TECHNETIUM TC 99M TETROFOSMIN IV KIT
10.7000 | PACK | Freq: Once | INTRAVENOUS | Status: AC | PRN
  Administered 2023-01-04: 10.7 via INTRAVENOUS

## 2023-01-04 MED ORDER — REGADENOSON 0.4 MG/5ML IV SOLN
0.4000 mg | Freq: Once | INTRAVENOUS | Status: AC
  Administered 2023-01-04: 0.4 mg via INTRAVENOUS

## 2023-01-07 ENCOUNTER — Telehealth: Payer: Self-pay | Admitting: Cardiovascular Disease

## 2023-01-07 NOTE — Telephone Encounter (Signed)
  Pt returning to call get stress test result. Transfer call to RN [employer]

## 2023-01-11 ENCOUNTER — Ambulatory Visit: Payer: Medicare HMO | Admitting: Cardiology

## 2023-01-15 ENCOUNTER — Encounter: Payer: Self-pay | Admitting: Nurse Practitioner

## 2023-01-15 ENCOUNTER — Ambulatory Visit: Payer: Medicare HMO | Attending: Cardiology | Admitting: Nurse Practitioner

## 2023-01-15 VITALS — BP 128/64 | HR 92 | Ht 68.0 in | Wt 157.2 lb

## 2023-01-15 DIAGNOSIS — I351 Nonrheumatic aortic (valve) insufficiency: Secondary | ICD-10-CM

## 2023-01-15 DIAGNOSIS — E785 Hyperlipidemia, unspecified: Secondary | ICD-10-CM

## 2023-01-15 DIAGNOSIS — I10 Essential (primary) hypertension: Secondary | ICD-10-CM

## 2023-01-15 DIAGNOSIS — E1165 Type 2 diabetes mellitus with hyperglycemia: Secondary | ICD-10-CM

## 2023-01-15 DIAGNOSIS — I350 Nonrheumatic aortic (valve) stenosis: Secondary | ICD-10-CM | POA: Diagnosis not present

## 2023-01-15 DIAGNOSIS — Z794 Long term (current) use of insulin: Secondary | ICD-10-CM | POA: Diagnosis not present

## 2023-01-15 DIAGNOSIS — I6523 Occlusion and stenosis of bilateral carotid arteries: Secondary | ICD-10-CM

## 2023-01-15 DIAGNOSIS — R9439 Abnormal result of other cardiovascular function study: Secondary | ICD-10-CM

## 2023-01-15 DIAGNOSIS — I4729 Other ventricular tachycardia: Secondary | ICD-10-CM | POA: Diagnosis not present

## 2023-01-15 DIAGNOSIS — I471 Supraventricular tachycardia, unspecified: Secondary | ICD-10-CM | POA: Diagnosis not present

## 2023-01-15 DIAGNOSIS — I739 Peripheral vascular disease, unspecified: Secondary | ICD-10-CM

## 2023-01-15 DIAGNOSIS — G459 Transient cerebral ischemic attack, unspecified: Secondary | ICD-10-CM

## 2023-01-15 NOTE — Patient Instructions (Signed)
Medication Instructions:  Your physician recommends that you continue on your current medications as directed. Please refer to the Current Medication list given to you today.   *If you need a refill on your cardiac medications before your next appointment, please call your pharmacy*   Lab Work: NONE ordered at this time of appointment   If you have labs (blood work) drawn today and your tests are completely normal, you will receive your results only by: [place] (if you have MyChart) OR A paper copy in the mail If you have any lab test that is abnormal or we need to change your treatment, we will call you to review the results.   Testing/Procedures: NONE ordered at this time of appointment     Follow-Up: At [facility], you and your health needs are our priority.  As part of our continuing mission to provide you with exceptional heart care, we have created designated Provider Care Teams.  These Care Teams include your primary Cardiologist (physician) and Advanced Practice Providers (APPs -  Physician Assistants and Nurse Practitioners) who all work together to provide you with the care you need, when you need it.  We recommend signing up for the patient portal called "MyChart".  Sign up information is provided on this After Visit Summary.  MyChart is used to connect with patients for Virtual Visits (Telemedicine).  Patients are able to view lab/test results, encounter notes, upcoming appointments, etc.  Non-urgent messages can be sent to your provider as well.   To learn more about what you can do with MyChart, go to [email or web address].    Your next appointment:   Keep follow up   Provider:   Kathlyn Sacramento, MD     Other Instructions

## 2023-01-15 NOTE — Progress Notes (Unsigned)
Office Visit    Patient Name: Joseph Dixon Date of Encounter: 01/15/2023 Primary Care Provider:  Elby Showers, MD Primary Cardiologist:  Kathlyn Sacramento, MD  Chief Complaint    72 year old male with a history of abnormal stress test, PAD, NSVT, mild aortic stenosis, hypertension, hyperlipidemia, amaurosis fugax, carotid artery stenosis, and type 2 diabetes who presents for follow-up related to abnormal stress testing.  Past Medical History    Past Medical History:  Diagnosis Date   Cancer ([place])    basal cell both shoulders   Diabetes mellitus    insulin dependant   Hyperlipidemia    Past Surgical History:  Procedure Laterality Date   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   Proximal right great toe   repair of rt knee laceration      Allergies  Allergies  Allergen Reactions   Sulfa Antibiotics     hallucinations     Labs/Other Studies Reviewed    The following studies were reviewed today: Echo 11/18/2022: IMPRESSIONS    1. Left ventricular ejection fraction, by estimation, is 60 to 65%. The  left ventricle has normal function. The left ventricle has no regional  wall motion abnormalities. Left ventricular diastolic parameters are  consistent with Grade II diastolic  dysfunction (pseudonormalization). The average left ventricular global  longitudinal strain is -19.4 %. The global longitudinal strain is normal.   2. Right ventricular systolic function is normal. The right ventricular  size is normal. There is normal pulmonary artery systolic pressure.   3. The mitral valve is normal in structure. Mild mitral valve  regurgitation. No evidence of mitral stenosis.   4. The aortic valve is calcified. Aortic valve regurgitation is mild.  Mild aortic valve stenosis.   5. The inferior vena cava is normal in size with greater than 50%  respiratory variability, suggesting right atrial pressure of 3 mmHg.   Comparison(s): No prior Echocardiogram.   Carotid  Duplex 11/27/2022: Summary:  Right Carotid: Velocities in the right ICA are consistent with a 1-39%  stenosis. Non-hemodynamically significant plaque <50% noted in the  CCA.   Left Carotid: Velocities in the left ICA are consistent with a 1-39%  stenosis. Non-hemodynamically significant plaque <50% noted in the  CCA. The ECA appears >50% stenosed.   Vertebrals:  Left vertebral artery demonstrates antegrade flow. Right  Vertebral artery demonstrates antegrade high resistant flow.  Subclavians: Normal flow hemodynamics were seen in bilateral subclavian arteries.   *See table(s) above for measurements and observations.    Zio 12/18/2022: Patch Wear Time:  13 days and 21 hours (2023-12-12T11:28:02-499 to 2023-12-26T08:55:39-0500)   Patient had a min HR of 40 bpm, max HR of 214 bpm, and avg HR of 75 bpm. Predominant underlying rhythm was Sinus Rhythm.  148 Ventricular Tachycardia runs occurred, the run with the fastest interval lasting 4 beats with a max rate of 214 bpm, the longest  lasting 5 beats with an avg rate of 173 bpm.  1247 Supraventricular Tachycardia runs occurred, the run with the fastest interval lasting 12.8 secs with a max rate of 203 bpm (avg 129 bpm); the run with the fastest interval was also the longest.  Supraventricular Tachycardia was detected within +/- 45 seconds of symptomatic patient event(s).  Frequent PACs with a burden of 10% and occasional PVCs with a burden of 3.1%. No evidence of atrial fibrillation.   Leane Call 01/04/2023:   Findings are consistent with infarction. The study is intermediate risk.   No ST deviation was  noted.   Left ventricular function is abnormal. Global function is mildly reduced. Nuclear stress EF: 46 %. The left ventricular ejection fraction is mildly decreased (45-54%). End diastolic cavity size is normal.   Moderate size and intensity fixed inferior defect, consistent with prior inferior infarct    Recent Labs: 10/05/2022:  ALT 19; BUN 10; Creat 0.75; Hemoglobin 15.2; Platelets 188; Potassium 4.5; Sodium 140  Recent Lipid Panel    Component Value Date/Time   CHOL 163 11/10/2022 0929   TRIG 134 11/10/2022 0929   HDL 55 11/10/2022 0929   CHOLHDL 3.0 11/10/2022 0929   VLDL 49 (H) 03/25/2017 1056   [place] 85 11/10/2022 0929    History of Present Illness   72 year old male with the above past medical history including abnormal stress test, PAD, NSVT, mild aortic stenosis, hypertension, hyperlipidemia, amaurosis fugax, carotid artery stenosis, and type 2 diabetes.  He was initially evaluated in 2019 by Dr. Cindee Lame for a slow healing small ulceration of the left big toe.  He underwent noninvasive vascular evaluation which showed near normal ABIs.  Duplex showed mainly tibial disease with an occluded anterior tibial artery on the right and an occluded anterior tibial and peroneal arteries on the left.  The ulceration heal without need for revascularization.  He had right big toe osteomyelitis in 2022 which resulted in amputation of the right big toe with subsequent healing.  He was evaluated by neurology in 08/2022 for suspected TIA/amaurosis fugax.  Echocardiogram showed normal LV systolic function, mild aortic stenosis. 14-day ZIO monitor revealed PSVT, NSVT, frequent PACs and PVCs.  Follow-up ETT was abnormal and showed evidence of moderate size and intensity fixed inferior defect, consistent with prior inferior infarct.  Follow-up cardiac catheterization was recommended.  He presents today for follow-up. Since his last visit been stable from a cardiac standpoint.  He denies symptoms concerning for angina, denies dyspnea, palpitations, dizziness, presyncope, syncope, claudication, edema, PND, orthopnea, or weight gain. Overall, he reports feeling well.  Home Medications    Current Outpatient Medications  Medication Sig Dispense Refill   ALPRAZolam (XANAX) 0.25 MG tablet TAKE 1 TABLET BY MOUTH EVERYDAY AT BEDTIME  (Patient taking differently: as needed.) 90 tablet 0   aspirin EC 81 MG tablet Take 1 tablet (81 mg total) by mouth daily. 90 tablet 3   BD PEN NEEDLE NANO 2ND GEN 32G X 4 MM MISC USE 1 PEN NEEDLE DAILY 100 each 2   blood glucose meter kit and supplies KIT Dispense based on patient and insurance preference. ICD-10: E11.9 1 each 0   dapagliflozin propanediol (FARXIGA) 10 MG TABS tablet Take 1 tablet (10 mg total) by mouth daily before breakfast. 30 tablet 6   gabapentin (NEURONTIN) 100 MG capsule Take 1 capsule (100 mg total) by mouth 3 (three) times daily. 90 capsule 3   glucose blood (ONETOUCH VERIO) test strip Check sugar 4 times daily 400 strip 3   glucose blood (ONETOUCH VERIO) test strip 1 each by Other route in the morning, at noon, in the evening, and at bedtime. Use as instructed 400 each 3   hydrochlorothiazide (HYDRODIURIL) 25 MG tablet TAKE 1 TABLET BY MOUTH EVERY DAY. NEED OFFICE VISIT 90 tablet 1   Insulin Glargine (BASAGLAR KWIKPEN) 100 UNIT/ML Inject 55 Units into the skin daily. 60 mL 3   insulin lispro (HUMALOG KWIKPEN) 200 UNIT/ML KwikPen Max daily 90 units (Patient taking differently: 30 Units with breakfast, with lunch, and with evening meal. Max daily 90 units) 45 mL 3  losartan (COZAAR) 100 MG tablet TAKE 1 TABLET BY MOUTH EVERY DAY 90 tablet 1   metoCLOPramide (REGLAN) 5 MG tablet TAKE 1 TABLET BY MOUTH 3 TIMES A DAY BEFORE MEALS 270 tablet 1   OneTouch Delica Lancets 83T MISC 1 Device by Does not apply route in the morning, at noon, in the evening, and at bedtime. 400 each 3   pantoprazole (PROTONIX) 40 MG tablet TAKE 1 TABLET BY MOUTH EVERY DAY 90 tablet 3   rosuvastatin (CRESTOR) 20 MG tablet TAKE 1 TABLET BY MOUTH EVERY DAY 90 tablet 1   sildenafil (VIAGRA) 100 MG tablet TAKE 1 TABLET 1 HOUR BEFORE INTERCOURSE 4 tablet 11   No current facility-administered medications for this visit.     Review of Systems   He denies chest pain, palpitations, dyspnea, pnd,  orthopnea, n, v, dizziness, syncope, edema, weight gain, or early satiety. All other systems reviewed and are otherwise negative except as noted above.   Physical Exam    VS:  BP 128/64   Pulse 92   Ht '5\' 8"'$  (1.727 m)   Wt 157 lb 3.2 oz (71.3 kg)   SpO2 98%   BMI 23.90 kg/m  GEN: Well nourished, well developed, in no acute distress. HEENT: normal. Neck: Supple, no JVD, carotid bruits, or masses. Cardiac: RRR, no murmurs, rubs, or gallops. No clubbing, cyanosis, edema.  Radials/DP/PT 2+ and equal bilaterally.  Respiratory:  Respirations regular and unlabored, clear to auscultation bilaterally. GI: Soft, nontender, nondistended, BS + x 4. MS: no deformity or atrophy. Skin: warm and dry, no rash. Neuro:  Strength and sensation are intact. Psych: Normal affect.  Accessory Clinical Findings    ECG personally reviewed by me today -sinus rhythm, 92 bpm, sinus arrhythmia- no acute changes.   Lab Results  Component Value Date   WBC 7.1 10/05/2022   HGB 15.2 10/05/2022   HCT 44.3 10/05/2022   MCV 87.5 10/05/2022   PLT 188 10/05/2022   Lab Results  Component Value Date   CREATININE 0.75 10/05/2022   BUN 10 10/05/2022   NA 140 10/05/2022   K 4.5 10/05/2022   CL 103 10/05/2022   CO2 26 10/05/2022   Lab Results  Component Value Date   ALT 19 10/05/2022   AST 18 10/05/2022   ALKPHOS 62 03/25/2017   BILITOT 0.4 10/05/2022   Lab Results  Component Value Date   CHOL 163 11/10/2022   HDL 55 11/10/2022   LDLCALC 85 11/10/2022   TRIG 134 11/10/2022   CHOLHDL 3.0 11/10/2022    Lab Results  Component Value Date   HGBA1C 7.2 (H) 10/05/2022    Assessment & Plan    1. Abnormal stress test/NSVT: 14-day ZIO in the setting of TIA showed NSVT. Follow-up stress test showed evidence of moderate size and intensity fixed inferior defect, consistent with prior inferior infarct.  Cardiac catheterization was recommended.  Discussed procedure in detail with patient today,  however, pt  declines cath at this time.  He would like more time to think about it before making a final decision.  Stable with no anginal symptoms, denies palpitations, dizziness, presyncope, syncope.  Discussed ED precautions.  He declined addition of metoprolol today.  Continue aspirin, Crestor.  2. PSVT: Noted on most recent cardiac monitor.  He denies palpitations.  Declines beta-blocker therapy.  3. PAD: Known tibial disease, no symptoms of claudication or lower extremity ulceration.  Continue aspirin, Crestor.  4. Mild aortic stenosis/aortic insufficiency: Mild on most recent echo in  10/2022.  He is asymptomatic. Can be followed with routine repeat echo every 2 to 3 years per Dr. Fletcher Anon.  5. Carotid artery stenosis: Carotid ultrasound in 11/2022 revealed 1 to 39% B ICA stenosis.  Continue aspirin, Crestor.  6. Hypertension: BP well controlled. Continue current antihypertensive regimen.   7. Hyperlipidemia: LDL was 85 in 10/2022. Continue Crestor.   8. H/o TIA/amaurosis fugax: 14-day ZIO showed no evidence of atrial fibrillation.  He is following with neurology.  No recurrence.  Continue aspirin, Crestor.  9. Type 2 diabetes: A1c was 7.2 in 09/2022.  Monitored and managed per PCP.  10. Disposition: Follow-up as scheduled with Dr. Fletcher Anon in 02/2023.      Lenna Sciara, NP 01/16/2023, 3:03 PM

## 2023-01-16 ENCOUNTER — Encounter: Payer: Self-pay | Admitting: Nurse Practitioner

## 2023-01-20 ENCOUNTER — Ambulatory Visit
Admission: RE | Admit: 2023-01-20 | Discharge: 2023-01-20 | Disposition: A | Discharge status: Home / Self Care | Payer: Medicare HMO | Source: Ambulatory Visit | Attending: Neurology | Admitting: Neurology

## 2023-01-20 DIAGNOSIS — G459 Transient cerebral ischemic attack, unspecified: Secondary | ICD-10-CM

## 2023-01-21 ENCOUNTER — Telehealth: Payer: Self-pay

## 2023-01-21 NOTE — Telephone Encounter (Signed)
Called pt and told him his results. Pt verbalized understanding.

## 2023-01-29 ENCOUNTER — Ambulatory Visit: Payer: Self-pay

## 2023-01-29 NOTE — Patient Outreach (Signed)
  Care Coordination   01/29/2023 Name: Joseph Dixon MRN: [id number] DOB: 02/08/1951   Care Coordination Outreach Attempts:  An unsuccessful telephone outreach was attempted today to offer the patient information about available care coordination services as a benefit of their health plan.   Follow Up Plan:  Additional outreach attempts will be made to offer the patient care coordination information and services.   Encounter Outcome:  No Answer   Care Coordination Interventions:  No, not indicated    Daneen Schick, BSW, CDP Social Worker, Certified Dementia Practitioner [facility]  Care Coordination [phone]

## 2023-02-17 ENCOUNTER — Ambulatory Visit: Payer: Self-pay

## 2023-02-17 NOTE — Patient Instructions (Signed)
Visit Information  Thank you for taking time to visit with me today. Please don't hesitate to contact me if I can be of assistance to you.   Following are the goals we discussed today:   Goals Addressed             This Visit's Progress    COMPLETED: Care Coordination Activities       Care Coordination Interventions: SDoH screening performed - no acute resource challenges identified at this time Determined the patient does not have concerns with medication costs at this time Performed chart review to note patients health plan has a $75 quarterly over the counter benefit Provided verbal and written education on how to access over the counter benefit Education provided on the role of the care coordination team - patient declines follow up at this time Instructed the patient to contact his primary care provider as needed         If you are experiencing a Mental Health or [place] or need someone to talk to, please call 911  The patient verbalized understanding of instructions, educational materials, and care plan provided today and DECLINED offer to receive copy of patient instructions, educational materials, and care plan.   No further follow up required: Please contact your primary care provider as needed.  Daneen Schick, BSW, CDP Social Worker, Certified Dementia Practitioner [facility]  Care Coordination [phone]

## 2023-02-17 NOTE — Patient Outreach (Signed)
  Care Coordination   Initial Visit Note   02/17/2023 Name: Joseph Dixon MRN: [id number] DOB: August 26, 1951  Joseph Dixon is a 72 y.o. year old male who sees Baxley, Cresenciano Lick, MD for primary care. I spoke with  Joseph Dixon by phone today.  What matters to the patients health and wellness today?  No concerns, doing well at this time.    Goals Addressed             This Visit's Progress    COMPLETED: Care Coordination Activities       Care Coordination Interventions: SDoH screening performed - no acute resource challenges identified at this time Determined the patient does not have concerns with medication costs at this time Performed chart review to note patients health plan has a $75 quarterly over the counter benefit Provided verbal and written education on how to access over the counter benefit Education provided on the role of the care coordination team - patient declines follow up at this time Instructed the patient to contact his primary care provider as needed         SDOH assessments and interventions completed:  Yes  SDOH Interventions Today    Flowsheet Row Most Recent Value  SDOH Interventions   Food Insecurity Interventions Intervention Not Indicated  Housing Interventions Intervention Not Indicated  Transportation Interventions Intervention Not Indicated  Utilities Interventions Intervention Not Indicated  Financial Strain Interventions Intervention Not Indicated        Care Coordination Interventions:  Yes, provided   Interventions Today    Flowsheet Row Most Recent Value  Chronic Disease   Chronic disease during today's visit Diabetes  General Interventions   General Interventions Discussed/Reviewed General Interventions Discussed  Education Interventions   Education Provided Provided Printed Education, Provided Education  Provided Verbal Education On Insurance Plans        Follow up plan: No further intervention required.   Encounter  Outcome:  Pt. Visit Completed   Daneen Schick, BSW, CDP Social Worker, Certified Dementia Practitioner [facility]  Care Coordination [phone]

## 2023-02-19 ENCOUNTER — Other Ambulatory Visit: Payer: Self-pay | Admitting: Internal Medicine

## 2023-02-22 ENCOUNTER — Ambulatory Visit: Payer: Medicare HMO | Admitting: Podiatry

## 2023-02-22 ENCOUNTER — Encounter: Payer: Self-pay | Admitting: Podiatry

## 2023-02-22 DIAGNOSIS — Z794 Long term (current) use of insulin: Secondary | ICD-10-CM

## 2023-02-22 DIAGNOSIS — M79675 Pain in left toe(s): Secondary | ICD-10-CM | POA: Diagnosis not present

## 2023-02-22 DIAGNOSIS — M79674 Pain in right toe(s): Secondary | ICD-10-CM

## 2023-02-22 DIAGNOSIS — E1142 Type 2 diabetes mellitus with diabetic polyneuropathy: Secondary | ICD-10-CM

## 2023-02-22 DIAGNOSIS — B351 Tinea unguium: Secondary | ICD-10-CM | POA: Diagnosis not present

## 2023-02-22 NOTE — Progress Notes (Signed)
  Subjective:  Patient ID: Joseph Dixon, male    DOB: December 08, 1951,   MRN: [id number]  Chief Complaint  Patient presents with   Nail Problem    EST - LEFT FOOT GREAT TOENAIL HAS TURNED DARK / HEEL PAIN   Diabetes    72 y.o. male presents for concern of thickened elongated and painful nails that are difficult to trim. Particularly the right great is discolored.  Requesting to have them trimmed today. Relates burning and tingling in their feet. Patient is diabetic and last A1c was  Lab Results  Component Value Date   HGBA1C 7.2 (H) 10/05/2022   .   PCP:  Elby Showers, MD    . Denies any other pedal complaints. Denies n/v/f/c.   Past Medical History:  Diagnosis Date   Cancer ([place])    basal cell both shoulders   Diabetes mellitus    insulin dependant   Hyperlipidemia     Objective:  Physical Exam: Vascular: DP/PT pulses 2/4 bilateral. CFT <3 seconds. Absent hair growth on digits. Edema noted to bilateral lower extremities. Xerosis noted bilaterally.  Skin. No lacerations or abrasions bilateral feet. Nails 2-5 bilateral  are thickened discolored and elongated with subungual debris. Right hallux nail with blue discoloration from likely hemorrhage underlying nail.  Musculoskeletal: MMT 5/5 bilateral lower extremities in DF, PF, Inversion and Eversion. Deceased ROM in DF of ankle joint.  Partial amputation of right hallux.  Neurological: Sensation intact to light touch. Protective sensation diminished bilateral.    Assessment:   1. Pain due to onychomycosis of toenails of both feet   2. Type 2 diabetes mellitus with diabetic polyneuropathy, with long-term current use of insulin ([place])      Plan:  Patient was evaluated and treated and all questions answered. -Discussed and educated patient on diabetic foot care, especially with  regards to the vascular, neurological and musculoskeletal systems.  -Stressed the importance of good glycemic control and the detriment of not   controlling glucose levels in relation to the foot. -Discussed supportive shoes at all times and checking feet regularly.  -Mechanically debrided all nails 1-5 bilateral using sterile nail nipper and filed with dremel without incident  -Answered all patient questions -Patient to return  in 3 months for at risk foot care -Patient advised to call the office if any problems or questions arise in the meantime.   Lorenda Peck, DPM

## 2023-03-09 ENCOUNTER — Encounter: Payer: Self-pay | Admitting: Cardiovascular Disease

## 2023-03-09 ENCOUNTER — Ambulatory Visit: Payer: Medicare HMO | Attending: Cardiovascular Disease | Admitting: Cardiovascular Disease

## 2023-03-09 VITALS — BP 132/86 | HR 67 | Ht 68.0 in | Wt 161.0 lb

## 2023-03-09 DIAGNOSIS — I739 Peripheral vascular disease, unspecified: Secondary | ICD-10-CM | POA: Diagnosis not present

## 2023-03-09 DIAGNOSIS — E785 Hyperlipidemia, unspecified: Secondary | ICD-10-CM | POA: Diagnosis not present

## 2023-03-09 DIAGNOSIS — I4729 Other ventricular tachycardia: Secondary | ICD-10-CM | POA: Diagnosis not present

## 2023-03-09 DIAGNOSIS — I350 Nonrheumatic aortic (valve) stenosis: Secondary | ICD-10-CM | POA: Diagnosis not present

## 2023-03-09 DIAGNOSIS — I2510 Atherosclerotic heart disease of native coronary artery without angina pectoris: Secondary | ICD-10-CM | POA: Diagnosis not present

## 2023-03-09 DIAGNOSIS — I10 Essential (primary) hypertension: Secondary | ICD-10-CM

## 2023-03-09 MED ORDER — ROSUVASTATIN CALCIUM 40 MG PO TABS: 40.0000 mg | ORAL_TABLET | Freq: Every day | ORAL | 3 refills | Status: DC

## 2023-03-09 NOTE — Progress Notes (Unsigned)
Cardiology Office Note   Date:  03/10/2023   ID:  Joseph Dixon, DOB 18-Jan-1951, MRN [id number]  PCP:  Elby Showers, MD  Cardiologist:   Kathlyn Sacramento, MD   No chief complaint on file.      History of Present Illness: Joseph Dixon is a 72 y.o. male who is here today  for a follow-up visit regarding peripheral arterial disease and coronary artery disease.  The patient has known history of diabetes mellitus, hypertension and hyperlipidemia.   He is not a smoker and has no family history of premature coronary artery disease. He was seen in 2019 for a slow healing small ulceration on the left big toe. He underwent noninvasive vascular evaluation which showed near normal ABI.  Duplex showed mainly tibial disease with an occluded anterior tibial artery on the right and an occluded anterior tibial and peroneal arteries on the left.  The ulceration healed without need for revascularization. 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    He was suspected of TIA in September of last year with sudden loss of vision in the right eye which lasted for few minutes and was followed by visual disturbances and blurry vision. He was noted to have irregular heartbeat and there has been a concern about possible paroxysmal atrial fibrillation.  He had an echocardiogram done in November which showed normal LV systolic function with mild aortic stenosis. A 2 weeks ZIO monitor was performed which showed no evidence of atrial fibrillation.  However, he was noted to have short runs of SVT and frequent episodes of nonsustained ventricular tachycardia.  Due to that, he underwent a [place] which showed evidence of prior inferior infarct without significant ischemia.  EF was mildly reduced. 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 He is doing well and denies chest  pain.  He reports mild exertional dyspnea but he is able to do yard work and he walks 1 mile daily with no symptoms.  Past Medical History:  Diagnosis Date   Cancer ([place])    basal cell both shoulders   Diabetes mellitus    insulin dependant   Hyperlipidemia     Past Surgical History:  Procedure Laterality Date   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   Proximal right great toe   repair of rt knee laceration       Current Outpatient Medications  Medication Sig Dispense Refill   ALPRAZolam (XANAX) 0.25 MG tablet TAKE 1 TABLET BY MOUTH EVERYDAY AT BEDTIME (Patient taking differently: as needed.) 90 tablet 0   aspirin EC 81 MG tablet Take 1 tablet (81 mg total) by mouth daily. 90 tablet 3   BD PEN NEEDLE NANO 2ND GEN 32G X 4 MM MISC USE 1 PEN NEEDLE DAILY 100 each 2   blood glucose meter kit and supplies KIT Dispense based on patient and insurance preference. ICD-10: E11.9 1 each 0   FARXIGA 10 MG TABS tablet TAKE 1 TABLET BY MOUTH DAILY BEFORE BREAKFAST. 30 tablet 6   gabapentin (NEURONTIN) 100 MG capsule Take 1 capsule (100 mg total) by mouth 3 (three) times daily. 90 capsule 3   glucose blood (ONETOUCH VERIO) test strip Check sugar 4 times daily 400 strip 3   glucose blood (ONETOUCH VERIO) test strip 1 each by Other route in the morning, at noon, in the evening, and at bedtime. Use  as instructed 400 each 3   hydrochlorothiazide (HYDRODIURIL) 25 MG tablet TAKE 1 TABLET BY MOUTH EVERY DAY. NEED OFFICE VISIT 90 tablet 1   Insulin Glargine (BASAGLAR KWIKPEN) 100 UNIT/ML Inject 55 Units into the skin daily. 60 mL 3   insulin lispro (HUMALOG KWIKPEN) 200 UNIT/ML KwikPen Max daily 90 units (Patient taking differently: 30 Units with breakfast, with lunch, and with evening meal. Max daily 90 units) 45 mL 3   losartan (COZAAR) 100 MG tablet TAKE 1 TABLET BY MOUTH EVERY DAY 90 tablet 1   metoCLOPramide (REGLAN) 5 MG tablet TAKE 1 TABLET BY MOUTH 3 TIMES A DAY BEFORE MEALS 270 tablet 1    OneTouch Delica Lancets [id number] MISC 1 Device by Does not apply route in the morning, at noon, in the evening, and at bedtime. 400 each 3   pantoprazole (PROTONIX) 40 MG tablet TAKE 1 TABLET BY MOUTH EVERY DAY 90 tablet 3   sildenafil (VIAGRA) 100 MG tablet TAKE 1 TABLET 1 HOUR BEFORE INTERCOURSE 4 tablet 11   rosuvastatin (CRESTOR) 40 MG tablet Take 1 tablet (40 mg total) by mouth daily. 90 tablet 3   No current facility-administered medications for this visit.    Allergies:   Sulfa antibiotics    Social History:  The patient  reports that he has quit smoking. He has never used smokeless tobacco. He reports current alcohol use. He reports that he does not use drugs.   Family History:  The patient's family history includes Alzheimer's disease in his father and mother; COPD in his brother; Diabetes in his father and mother; Heart disease in his father; Mental illness in his father.    ROS:  Please see the history of present illness.   Otherwise, review of systems are positive for none.   All other systems are reviewed and negative.    PHYSICAL EXAM: VS:  BP 132/86   Pulse 67   Ht 5\' 8"  (1.727 m)   Wt 161 lb (73 kg)   SpO2 97%   BMI 24.48 kg/m  , BMI Body mass index is 24.48 kg/m. GEN: Well nourished, well developed, in no acute distress  HEENT: normal  Neck: no JVD,  or masses.  Bilateral carotid bruits louder on the left side Cardiac: RRR; no rubs, or gallops,no edema .  2 out of 6 systolic murmur in the aortic area which is early peaking. Respiratory:  clear to auscultation bilaterally, normal work of breathing GI: soft, nontender, nondistended, + BS MS: no deformity or atrophy  Skin: warm and dry, no rash Neuro:  Strength and sensation are intact Psych: euthymic mood, full affect    EKG:  EKG is  ordered today.  EKG showed normal sinus rhythm with PACs and old inferior infarct.   Recent Labs: 10/05/2022: ALT 19; BUN 10; Creat 0.75; Hemoglobin 15.2; Platelets 188;  Potassium 4.5; Sodium 140    Lipid Panel    Component Value Date/Time   CHOL 163 11/10/2022 0929   TRIG 134 11/10/2022 0929   HDL 55 11/10/2022 0929   CHOLHDL 3.0 11/10/2022 0929   VLDL 49 (H) 03/25/2017 1056   LDLCALC 85 11/10/2022 0929      Wt Readings from Last 3 Encounters:  03/09/23 161 lb (73 kg)  01/15/23 157 lb 3.2 oz (71.3 kg)  11/24/22 156 lb 9.6 oz (71 kg)           No data to display            ASSESSMENT  AND PLAN:  1.  Coronary artery disease involving native coronary arteries without angina: His EKG and myocardial perfusion imaging is consistent with prior inferior infarct without significant ischemia.  Fortunately, he has no anginal symptoms at the present time.  He has prolonged history of diabetes mellitus and likely had a silent inferior infarct at some point. I discussed with him the option of proceeding with left heart catheterization but given his minimal symptoms at the present time, I think it is reasonable to continue with medical therapy.  Continue low-dose aspirin indefinitely.  2.  Peripheral arterial disease: The patient has known tibial disease but currently has no claudication or lower extremity ulceration.  He did have right big toe surgery last year but that has healed completely.  Continue low-dose aspirin.    3.  Recent amaurosis fugax: Likely due to small posterior inferior cerebellar artery infarct as confirmed by MRI.  No significant carotid disease.  4.  Aortic stenosis: This was mild on most recent echocardiogram and can be followed with surveillance every 2 to 3 years.  5.  Essential hypertension: Blood pressure is controlled.  6.  Hyperlipidemia: Currently on rosuvastatin 20 mg once daily.  Most recent lipid profile showed an LDL of 85.  I elected to increase his rosuvastatin to 40 mg once daily to try to get his LDL below 70.  7.  Nonsustained ventricular tachycardia: Completely asymptomatic.  Likely due to prior inferior scar.   His resting heart rate is on the low side and thus I did not add a beta-blocker.    Disposition:   FU with me in 6 months  Signed,  Kathlyn Sacramento, MD  03/10/2023 12:58 PM    [place]

## 2023-03-09 NOTE — Patient Instructions (Signed)
Medication Instructions:  INCREASE the Rosuvastatin (Crestor) to 40 mg once daily *If you need a refill on your cardiac medications before your next appointment, please call your pharmacy*   Lab Work: None ordered If you have labs (blood work) drawn today and your tests are completely normal, you will receive your results only by: [place] (if you have MyChart) OR A paper copy in the mail If you have any lab test that is abnormal or we need to change your treatment, we will call you to review the results.   Testing/Procedures: None ordered   Follow-Up: At [facility], you and your health needs are our priority.  As part of our continuing mission to provide you with exceptional heart care, we have created designated Provider Care Teams.  These Care Teams include your primary Cardiologist (physician) and Advanced Practice Providers (APPs -  Physician Assistants and Nurse Practitioners) who all work together to provide you with the care you need, when you need it.  We recommend signing up for the patient portal called "MyChart".  Sign up information is provided on this After Visit Summary.  MyChart is used to connect with patients for Virtual Visits (Telemedicine).  Patients are able to view lab/test results, encounter notes, upcoming appointments, etc.  Non-urgent messages can be sent to your provider as well.   To learn more about what you can do with MyChart, go to [email or web address].    Your next appointment:   6 month(s)  Provider:   Kathlyn Sacramento, MD

## 2023-03-16 ENCOUNTER — Other Ambulatory Visit: Payer: Self-pay | Admitting: Internal Medicine

## 2023-04-20 ENCOUNTER — Other Ambulatory Visit: Payer: Medicare HMO | Admitting: Pharmacist

## 2023-04-20 NOTE — Progress Notes (Signed)
Patient appearing on report for quality metrics due to: med adherence cholesterol (MAC), med adherence diabetes (MAD), and med adherence hypertension [facility]). Next appointment with PCP is 11/22/23   Outreached patient to discuss adherence and medication management.   Current antihypertensives: losartan 100mg  daily, hctz 25mg  daily  Current diabetes medications: basaglar 55 units daily, humalog 30 units TID(AC), and farxiga 10mg  daily  Current cholesterol medication: rosuvastatin 40mg  daily  Patient denies side effects, symptoms, or concerns related to current medications.  He states his cholesterol medication was recently increased in dosage by cardiologist. His diabetes medications are managed by endocrinologist and he is on patient assistance for farxiga. He receives his blood pressure medicines by primary provider Dr. Lenord Fellers.  He reports all is going well with medications and has no concerns at present with medication adherence, routines, or keeping them organized.   Assessment/Plan: - Currently controlled - Recommend continue current medications  Lynnda Shields, PharmD, BCPS Clinical Pharmacist [facility]

## 2023-04-21 ENCOUNTER — Telehealth: Payer: Self-pay | Admitting: Cardiovascular Disease

## 2023-04-21 NOTE — Telephone Encounter (Signed)
Patient states for the past few weeks he has been more tired and fatigued. He would like to know if Dr. Kirke Corin has recommendations.

## 2023-04-21 NOTE — Telephone Encounter (Signed)
Patient states fatigue over the last few weeks.  Difficult to get up in the morning and just still feels tired after getting up.  Still takes walks with the dogs, a mile or so.  Still does.  Just feels rundown every morning, nothing horrible, just more than his last visit. He ask that anything you have planned for test or otherwise, he would like to be done soon.  Please advise as no test seen just normal follow up appt in September

## 2023-04-21 NOTE — Telephone Encounter (Signed)
Left a message for the patient to call back.  

## 2023-04-21 NOTE — Telephone Encounter (Signed)
Schedule an office visit with APP to discuss proceeding with left heart catheterization.  I discussed this with him back in March but at that time he did not want to proceed as he was not having any symptoms.

## 2023-04-22 NOTE — Telephone Encounter (Signed)
Patient has been called. Appointment made with Dr. Kirke Corin, per the patient request. Appointment made for 5/28. The patient could not come in sooner. He stated that he would like his potential cath to be on 5/29.

## 2023-04-27 ENCOUNTER — Encounter: Payer: Self-pay | Admitting: Internal Medicine

## 2023-04-27 ENCOUNTER — Ambulatory Visit: Payer: Medicare HMO | Admitting: Internal Medicine

## 2023-04-27 VITALS — BP 126/80 | HR 74 | Ht 68.0 in | Wt 160.0 lb

## 2023-04-27 DIAGNOSIS — E1142 Type 2 diabetes mellitus with diabetic polyneuropathy: Secondary | ICD-10-CM

## 2023-04-27 DIAGNOSIS — Z794 Long term (current) use of insulin: Secondary | ICD-10-CM

## 2023-04-27 DIAGNOSIS — E1165 Type 2 diabetes mellitus with hyperglycemia: Secondary | ICD-10-CM | POA: Diagnosis not present

## 2023-04-27 DIAGNOSIS — Z7984 Long term (current) use of oral hypoglycemic drugs: Secondary | ICD-10-CM

## 2023-04-27 DIAGNOSIS — E1159 Type 2 diabetes mellitus with other circulatory complications: Secondary | ICD-10-CM | POA: Diagnosis not present

## 2023-04-27 DIAGNOSIS — E119 Type 2 diabetes mellitus without complications: Secondary | ICD-10-CM | POA: Insufficient documentation

## 2023-04-27 LAB — POCT GLYCOSYLATED HEMOGLOBIN (HGB A1C): Hemoglobin A1C: 7.4 % — AB (ref 4.0–5.6)

## 2023-04-27 NOTE — Progress Notes (Signed)
Name: Joseph Dixon  Age/ Sex: 72 y.o., male   MRN/ DOB: [id number], 1951/11/04     PCP: Margaree Mackintosh, MD   Reason for Endocrinology Evaluation: Type 2 Diabetes Mellitus  Initial Endocrine Consultative Visit: 11/29/2018    PATIENT IDENTIFIER: Joseph Dixon is a 72 y.o. male with a past medical history of DM, Oartic insufficiency and hyperlipidemia . The patient has followed with Endocrinology clinic since 11/29/2018 for consultative assistance with management of his diabetes.  DIABETIC HISTORY:  Joseph Dixon was diagnosed with T2DM ~ 2009, he does not recall any anti-glycemic agents he has been on, he has been on insulin for many years.  His hemoglobin A1c has ranged from 8.0% in 2019, peaking at 12.4% in 2016.   Pt with chronic intermittent Hx of diarrhea. That at times he attributes to Hx  ETOH intake.   Farxiga started 05/2021  SUBJECTIVE:   During the last visit (10/27/2022): A1c 7.2 %    Today (04/27/2023): Joseph Dixon is here for a 4 month follow up on his diabetes management.  He checks his blood sugars 3 times daily. The patient has had hypoglycemic episodes.  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, was last seen by podiatry on 02/22/2023  Had a follow-up with neurology in 10/2022 for amaurosis fugax  Denies nausea, vomiting  Denies constipation but has chronic diarrhea   HOME DIABETES REGIMEN:  Basaglar 55 units daily  Humalog 25- 30 units TIDQAC Farxiga 10 mg daily        GLUCOSE LOG: 68-429 mg/dL    DIABETIC COMPLICATIONS: Microvascular complications:  Neuropathy , retinopathy Denies: retinopathy, nephropathy  Last eye exam: Completed 09/25/2022   Macrovascular complications:  CAD, PVD, CVA     HISTORY:  Past Medical History:  Past Medical History:  Diagnosis Date   Cancer (HCC)    basal cell both shoulders   Diabetes mellitus    insulin dependant   Hyperlipidemia    Past  Surgical History:  Past Surgical History:  Procedure Laterality Date   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   Proximal right great toe   repair of rt knee laceration     Social History:  reports that he has quit smoking. He has never used smokeless tobacco. He reports current alcohol use. He reports that he does not use drugs. Family History:  Family History  Problem Relation Age of Onset   Heart disease Father    Diabetes Father    Mental illness Father    Alzheimer's disease Father    COPD Brother    Diabetes Mother    Alzheimer's disease Mother    Colon cancer Neg Hx      HOME MEDICATIONS: Allergies as of 04/27/2023       Reactions   Sulfa Antibiotics    hallucinations        Medication List        Accurate as of Apr 27, 2023  7:04 AM. If you have any questions, ask your nurse or doctor.          ALPRAZolam 0.25 MG tablet Commonly known as: XANAX TAKE 1 TABLET BY MOUTH EVERYDAY AT BEDTIME What changed: See the new instructions.   aspirin EC 81 MG tablet Take 1 tablet (81 mg total) by mouth daily.   Basaglar KwikPen 100 UNIT/ML Inject 55 Units into the skin daily.   BD Pen Needle Nano 2nd  Gen 32G X 4 MM Misc Generic drug: Insulin Pen Needle USE 1 PEN NEEDLE DAILY   blood glucose meter kit and supplies Kit Dispense based on patient and insurance preference. ICD-10: E11.9   Farxiga 10 MG Tabs tablet Generic drug: dapagliflozin propanediol TAKE 1 TABLET BY MOUTH DAILY BEFORE BREAKFAST.   gabapentin 100 MG capsule Commonly known as: NEURONTIN Take 1 capsule (100 mg total) by mouth 3 (three) times daily.   HumaLOG KwikPen 200 UNIT/ML KwikPen Generic drug: insulin lispro Max daily 90 units What changed:  how much to take when to take this   hydrochlorothiazide 25 MG tablet Commonly known as: HYDRODIURIL TAKE 1 TABLET BY MOUTH EVERY DAY. NEED OFFICE VISIT   losartan 100 MG tablet Commonly known as: COZAAR TAKE 1 TABLET BY MOUTH EVERY DAY    metoCLOPramide 5 MG tablet Commonly known as: REGLAN TAKE 1 TABLET BY MOUTH 3 TIMES A DAY BEFORE MEALS   OneTouch Delica Lancets 33G Misc 1 Device by Does not apply route in the morning, at noon, in the evening, and at bedtime.   OneTouch Verio test strip Generic drug: glucose blood Check sugar 4 times daily   OneTouch Verio test strip Generic drug: glucose blood 1 each by Other route in the morning, at noon, in the evening, and at bedtime. Use as instructed   pantoprazole 40 MG tablet Commonly known as: PROTONIX TAKE 1 TABLET BY MOUTH EVERY DAY   rosuvastatin 40 MG tablet Commonly known as: CRESTOR Take 1 tablet (40 mg total) by mouth daily.   sildenafil 100 MG tablet Commonly known as: Viagra TAKE 1 TABLET 1 HOUR BEFORE INTERCOURSE        PHYSICAL EXAM: VS: There were no vitals taken for this visit.   EXAM: General: Pt appears well and is in NAD  Lungs: Clear with good BS bilat with no rales, rhonchi, or wheezes  Heart: Auscultation: RRR with normal S1 and S2   Extremities:  BL LE: no pretibial edema    Mental Status: Judgment, insight: intact Orientation: oriented to time, place, and person Mood and affect: no depression, anxiety, or agitation     DM Foot Exam  01/2023 per Podiatry   The skin of the feet is  without sores or ulcerations ,priximal amputations of  right great toe  The pedal pulses are decreased bilaterally  The sensation is decreased to a screening 5.07, 10 gram monofilament bilaterally    DATA REVIEWED:  Lab Results  Component Value Date   HGBA1C 7.2 (H) 10/05/2022   HGBA1C 7.3 (A) 04/23/2022   HGBA1C 7.2 (A) 10/22/2021    Latest Reference Range & Units 10/05/22 11:03  Sodium 135 - 146 mmol/L 140  Potassium 3.5 - 5.3 mmol/L 4.5  Chloride 98 - 110 mmol/L 103  CO2 20 - 32 mmol/L 26  Glucose 65 - 139 mg/dL 161 (H)  Mean Plasma Glucose mg/dL 096  BUN 7 - 25 mg/dL 10  Creatinine 0.45 - 4.09 mg/dL 8.11  Calcium 8.6 - 91.4 mg/dL 9.5   BUN/Creatinine Ratio 6 - 22 (calc) SEE NOTE:  eGFR > OR = 60 mL/min/1.33m2 98  AG Ratio 1.0 - 2.5 (calc) 1.7  AST 10 - 35 U/L 18  ALT 9 - 46 U/L 19  Total Protein 6.1 - 8.1 g/dL 6.7  Total Bilirubin 0.2 - 1.2 mg/dL 0.4    ASSESSMENT / PLAN / RECOMMENDATIONS:   1) Type 2 Diabetes Mellitus, Sub-Optially controlled, With neuropathic , retinopathic and macrovascular complications -  Most recent A1c of 7.4 %. Goal A1c < 7.0 %.    - A1c remains above goal  -  He continues to decline CGM technology  - Pt continues with glycemic excrusion 68- > 400 mg/dL. Pt attributes some to forgetting his medications at time and not remembering if he took it or not but also due to insulin- carb mismatch  - Pt on assistance for insulin and Farxiga  -Has chronic history of GI issues, and he would like to avoid any medications that we will cause GI side effects.  Not a candidate for GLP-1 agonist nor metformin  MEDICATIONS:   Continue Basaglar 55 units daily  Continue  Humalog 25- 30 units 3 times daily before every meal Continue Farxiga 10 mg, 1 tablet daily   EDUCATION / INSTRUCTIONS: BG monitoring instructions: Patient is instructed to check his blood sugars 4 times a day, before meals and bedtime Call [facility] if: BG persistently < 70  I reviewed the Rule of 15 for the treatment of hypoglycemia in detail with the patient. Literature supplied.  2) Diabetic complications:  Eye: Does not have known diabetic retinopathy.  Neuro/ Feet: Does have known diabetic peripheral neuropathy. Renal: Patient does not have known baseline CKD. He is on an ACEI/ARB at present.           F/U 6 months   Signed electronically by: Lyndle Herrlich, MD  [facility] Endocr[facility]ology  [facility] [address] [place], [place] [address] Phone: [phone] FAX: [phone]   CC: Margaree Mackintosh, MD [address] [place] [address] Phone:  [phone]  Fax: [phone]  Return to Endocrinology clinic as below: Future Appointments  Date Time Provider Department Center  04/27/2023  9:10 AM , Konrad Dolores, MD [facility] None  05/18/2023 10:20 AM Iran Ouch, MD [facility] None  09/14/2023 10:00 AM Iran Ouch, MD [facility] None  11/08/2023  9:30 AM [facility]  11/22/2023 10:00 AM Baxley, Luanna Cole, MD [facility]

## 2023-04-27 NOTE — Patient Instructions (Signed)
-   Continue Basaglar 55 units  - Continue Humalog 25- 30 units with each meal  - Continue Farxiga 10 mg, 1 tablet daily   HOW TO TREAT LOW BLOOD SUGARS (Blood sugar LESS THAN 70 MG/DL) Please follow the RULE OF 15 for the treatment of hypoglycemia treatment (when your (blood sugars are less than 70 mg/dL)   STEP 1: Take 15 grams of carbohydrates when your blood sugar is low, which includes:  3-4 GLUCOSE TABS  OR 3-4 OZ OF JUICE OR REGULAR SODA OR ONE TUBE OF GLUCOSE GEL    STEP 2: RECHECK blood sugar in 15 MINUTES STEP 3: If your blood sugar is still low at the 15 minute recheck --> then, go back to STEP 1 and treat AGAIN with another 15 grams of carbohydrates.       

## 2023-05-12 DIAGNOSIS — L821 Other seborrheic keratosis: Secondary | ICD-10-CM | POA: Diagnosis not present

## 2023-05-12 DIAGNOSIS — Z85828 Personal history of other malignant neoplasm of skin: Secondary | ICD-10-CM | POA: Diagnosis not present

## 2023-05-12 DIAGNOSIS — D692 Other nonthrombocytopenic purpura: Secondary | ICD-10-CM | POA: Diagnosis not present

## 2023-05-12 DIAGNOSIS — L570 Actinic keratosis: Secondary | ICD-10-CM | POA: Diagnosis not present

## 2023-05-12 DIAGNOSIS — C4359 Malignant melanoma of other part of trunk: Secondary | ICD-10-CM | POA: Diagnosis not present

## 2023-05-18 ENCOUNTER — Encounter: Payer: Self-pay | Admitting: Cardiovascular Disease

## 2023-05-18 ENCOUNTER — Ambulatory Visit: Payer: Medicare HMO | Attending: Cardiovascular Disease | Admitting: Cardiovascular Disease

## 2023-05-18 VITALS — BP 130/50 | HR 69 | Ht 68.0 in | Wt 160.4 lb

## 2023-05-18 DIAGNOSIS — I739 Peripheral vascular disease, unspecified: Secondary | ICD-10-CM | POA: Diagnosis not present

## 2023-05-18 DIAGNOSIS — I350 Nonrheumatic aortic (valve) stenosis: Secondary | ICD-10-CM

## 2023-05-18 DIAGNOSIS — I25118 Atherosclerotic heart disease of native coronary artery with other forms of angina pectoris: Secondary | ICD-10-CM

## 2023-05-18 DIAGNOSIS — E785 Hyperlipidemia, unspecified: Secondary | ICD-10-CM | POA: Diagnosis not present

## 2023-05-18 DIAGNOSIS — I10 Essential (primary) hypertension: Secondary | ICD-10-CM

## 2023-05-18 NOTE — H&P (View-Only) (Signed)
  Cardiology Office Note   Date:  05/18/2023   ID:  Joseph Dixon, DOB 08/28/1951, MRN [id number]  PCP:  Joseph Dixon, Joseph J, MD  Cardiologist:    , MD   No chief complaint on file.      History of Present Illness: Joseph Dixon is a 71 y.o. male who is here today  for a follow-up visit regarding peripheral arterial disease and coronary artery disease.  The patient has known history of diabetes mellitus, hypertension and hyperlipidemia.   He is not a smoker and has no family history of premature coronary artery disease. He was seen in 2019 for a slow healing small ulceration on the left big toe. He underwent noninvasive vascular evaluation which showed near normal ABI.  Duplex showed mainly tibial disease with an occluded anterior tibial artery on the right and an occluded anterior tibial and peroneal arteries on the left.  The ulceration healed without need for revascularization. 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    He was suspected of TIA in September of last year with sudden loss of vision in the right eye which lasted for few minutes and was followed by visual disturbances and blurry vision. He was noted to have irregular heartbeat and there has been a concern about possible paroxysmal atrial fibrillation.  He had an echocardiogram done in November which showed normal LV systolic function with mild aortic stenosis. A 2 weeks ZIO monitor was performed which showed no evidence of atrial fibrillation.  However, he was noted to have short runs of SVT and frequent episodes of nonsustained ventricular tachycardia.  Due to that, he underwent a Lexiscan Myoview which showed evidence of prior inferior infarct without significant ischemia.  EF was mildly reduced. 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 He was seen in March and overall was  doing well with no reported exertional dyspnea or chest pain.  Thus, medical therapy was pursued.  However, he returns now reporting increased fatigue and exertional dyspnea with activities with occasional episodes of chest discomfort.  Past Medical History:  Diagnosis Date   Cancer (HCC)    basal cell both shoulders   Diabetes mellitus    insulin dependant   Hyperlipidemia     Past Surgical History:  Procedure Laterality Date   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   Proximal right great toe   repair of rt knee laceration       Current Outpatient Medications  Medication Sig Dispense Refill   ALPRAZolam (XANAX) 0.25 MG tablet TAKE 1 TABLET BY MOUTH EVERYDAY AT BEDTIME (Patient taking differently: as needed.) 90 tablet 0   aspirin EC 81 MG tablet Take 1 tablet (81 mg total) by mouth daily. 90 tablet 3   blood glucose meter kit and supplies KIT Dispense based on patient and insurance preference. ICD-10: E11.9 1 each 0   FARXIGA 10 MG TABS tablet TAKE 1 TABLET BY MOUTH DAILY BEFORE BREAKFAST. 30 tablet 6   gabapentin (NEURONTIN) 100 MG capsule Take 1 capsule (100 mg total) by mouth 3 (three) times daily. 90 capsule 3   glucose blood (ONETOUCH VERIO) test strip Check sugar 4 times daily 400 strip 3   glucose blood (ONETOUCH VERIO) test strip 1 each by Other route in the morning, at noon, in the evening, and at bedtime. Use as instructed 400 each 3   hydrochlorothiazide (HYDRODIURIL)   25 MG tablet TAKE 1 TABLET BY MOUTH EVERY DAY. NEED OFFICE VISIT 90 tablet 1   Insulin Glargine (BASAGLAR KWIKPEN) 100 UNIT/ML Inject 55 Units into the skin daily. 60 mL 3   insulin lispro (HUMALOG KWIKPEN) 200 UNIT/ML KwikPen Max daily 90 units (Patient taking differently: 30 Units with breakfast, with lunch, and with evening meal. Max daily 90 units) 45 mL 3   Insulin Pen Needle (BD PEN NEEDLE NANO 2ND GEN) 32G X 4 MM MISC USE 1 PEN NEEDLE DAILY 100 each 11   losartan (COZAAR) 100 MG tablet TAKE 1 TABLET  BY MOUTH EVERY DAY 90 tablet 1   metoCLOPramide (REGLAN) 5 MG tablet TAKE 1 TABLET BY MOUTH 3 TIMES A DAY BEFORE MEALS 270 tablet 1   OneTouch Delica Lancets 33G MISC 1 Device by Does not apply route in the morning, at noon, in the evening, and at bedtime. 400 each 3   pantoprazole (PROTONIX) 40 MG tablet TAKE 1 TABLET BY MOUTH EVERY DAY 90 tablet 3   rosuvastatin (CRESTOR) 40 MG tablet Take 1 tablet (40 mg total) by mouth daily. 90 tablet 3   sildenafil (VIAGRA) 100 MG tablet TAKE 1 TABLET 1 HOUR BEFORE INTERCOURSE 4 tablet 11   No current facility-administered medications for this visit.    Allergies:   Sulfa antibiotics    Social History:  The patient  reports that he has quit smoking. He has never used smokeless tobacco. He reports current alcohol use. He reports that he does not use drugs.   Family History:  The patient's family history includes Alzheimer's disease in his father and mother; COPD in his brother; Diabetes in his father and mother; Heart disease in his father; Mental illness in his father.    ROS:  Please see the history of present illness.   Otherwise, review of systems are positive for none.   All other systems are reviewed and negative.    PHYSICAL EXAM: VS:  BP (!) 130/50 (BP Location: Left Arm, Patient Position: Sitting, Cuff Size: Normal)   Pulse 69   Ht 5' 8" (1.727 m)   Wt 160 lb 6.4 oz (72.8 kg)   SpO2 97%   BMI 24.39 kg/m  , BMI Body mass index is 24.39 kg/m. GEN: Well nourished, well developed, in no acute distress  HEENT: normal  Neck: no JVD,  or masses.  Bilateral carotid bruits louder on the left side Cardiac: RRR; no rubs, or gallops,no edema .  2 out of 6 systolic murmur in the aortic area which is early peaking. Respiratory:  clear to auscultation bilaterally, normal work of breathing GI: soft, nontender, nondistended, + BS MS: no deformity or atrophy  Skin: warm and dry, no rash Neuro:  Strength and sensation are intact Psych: euthymic  mood, full affect    EKG:  EKG is  ordered today.  EKG showed normal sinus rhythm with prior inferior infarct.   Recent Labs: 10/05/2022: ALT 19; BUN 10; Creat 0.75; Hemoglobin 15.2; Platelets 188; Potassium 4.5; Sodium 140    Lipid Panel    Component Value Date/Time   CHOL 163 11/10/2022 0929   TRIG 134 11/10/2022 0929   HDL 55 11/10/2022 0929   CHOLHDL 3.0 11/10/2022 0929   VLDL 49 (H) 03/25/2017 1056   LDLCALC 85 11/10/2022 0929      Wt Readings from Last 3 Encounters:  05/18/23 160 lb 6.4 oz (72.8 kg)  04/27/23 160 lb (72.6 kg)  03/09/23 161 lb (73 kg)             No data to display            ASSESSMENT AND PLAN:  1.  Coronary artery disease involving native coronary arteries with other forms of angina:  His EKG and myocardial perfusion imaging is consistent with prior inferior infarct without significant ischemia.  Initially he did not report any symptoms back in March but now he returns with increased exertional dyspnea with activities as well as intermittent episodes of chest pain.   Due to that, I recommend proceeding with left heart catheterization and possible PCI.  I discussed the procedure in details as well as risk and benefits.  Planned access is via the right radial artery. Continue aspirin 81 mg once daily.  2.  Peripheral arterial disease: The patient has known tibial disease but currently has no claudication or lower extremity ulceration.  He did have right big toe surgery last year but that has healed completely.  Continue low-dose aspirin.    3.  amaurosis fugax: Likely due to small posterior inferior cerebellar artery infarct as confirmed by MRI.  No significant carotid disease.  4.  Aortic stenosis: This was mild on most recent echocardiogram and can be followed with surveillance every 2 to 3 years.  5.  Essential hypertension: Blood pressure is controlled.  6.  Hyperlipidemia: The dose of rosuvastatin was increased to 40 mg once daily  during last visit due to an LDL of 85.  7.  Nonsustained ventricular tachycardia: Completely asymptomatic.  Likely due to prior inferior scar.  His resting heart rate is on the low side and thus I did not add a beta-blocker.    Disposition:   Proceed with left heart catheterization next week and follow-up after.  Signed,   , MD  05/18/2023 11:21 AM     [facility] 

## 2023-05-18 NOTE — Patient Instructions (Addendum)
Medication Instructions:  No changes *If you need a refill on your cardiac medications before your next appointment, please call your pharmacy*  Testing/Procedures: Your physician has requested that you have a cardiac catheterization. Cardiac catheterization is used to diagnose and/or treat various heart conditions. Doctors may recommend this procedure for a number of different reasons. The most common reason is to evaluate chest pain. Chest pain can be a symptom of coronary artery disease (CAD), and cardiac catheterization can show whether plaque is narrowing or blocking your heart's arteries. This procedure is also used to evaluate the valves, as well as measure the blood flow and oxygen levels in different parts of your heart. For further information please visit [email or web address] Please follow instruction sheet, as given.    Follow-Up: At [facility], you and your health needs are our priority.  As part of our continuing mission to provide you with exceptional heart care, we have created designated Provider Care Teams.  These Care Teams include your primary Cardiologist (physician) and Advanced Practice Providers (APPs -  Physician Assistants and Nurse Practitioners) who all work together to provide you with the care you need, when you need it.  We recommend signing up for the patient portal called "MyChart".  Sign up information is provided on this After Visit Summary.  MyChart is used to connect with patients for Virtual Visits (Telemedicine).  Patients are able to view lab/test results, encounter notes, upcoming appointments, etc.  Non-urgent messages can be sent to your provider as well.   To learn more about what you can do with MyChart, go to [email or web address].    Your next appointment:   Keep your post procedure on 6/25 at 8 am  Other Instructions  [facility] [address]  [place] [id number] [facility] [place] [address] Dept: [phone] Loc: [phone]  KEYLIN STETTNER  05/18/2023  You are scheduled for a Cardiac Catheterization on Wednesday, June 5 with Dr. Lorine Bears.  1. Please arrive at the [facility] (Main Entrance A) at [facility]: [address] [place], [place] [address] at 10:30 AM (This time is 2 hour(s) before your procedure to ensure your preparation). Free valet parking service is available. You will check in at ADMITTING. The support person will be asked to wait in the waiting room.  It is OK to have someone drop you off and come back when you are ready to be discharged.    Special note: Every effort is made to have your procedure done on time. Please understand that emergencies sometimes delay scheduled procedures.  2. Diet: Do not eat solid foods after midnight.  The patient may have clear liquids until 5am upon the day of the procedure.  3. Labs: You will need to have blood drawn on 5/28. You do not need to be fasting.  4. Medication instructions in preparation for your procedure: Hold all diabetic medication the morning of the procedure  -take half the dose of the Glargine the night before the procedure Hold the Hydrochlorothiazide the morning of the procedure  On the morning of your procedure, take your Aspirin 81 mg and any morning medicines NOT listed above.  You may use sips of water.  5. Plan to go home the same day, you will only stay overnight if medically necessary. 6. Bring a current list of your medications and current insurance cards. 7. You MUST have a responsible person to drive  you home. 8. Someone MUST be with you the first 24 hours after you arrive home or your discharge will be delayed. 9. Please wear clothes that are easy to get on and off and wear slip-on shoes.  Thank you for allowing [place] to care for you!   -- [place]

## 2023-05-18 NOTE — Progress Notes (Signed)
Cardiology Office Note   Date:  05/18/2023   ID:  [name], DOB 05-Jan-1951, MRN [id number]  PCP:  Margaree Mackintosh, MD  Cardiologist:   Lorine Bears, MD   No chief complaint on file.      History of Present Illness: Joseph Dixon is a 72 y.o. male who is here today  for a follow-up visit regarding peripheral arterial disease and coronary artery disease.  The patient has known history of diabetes mellitus, hypertension and hyperlipidemia.   He is not a smoker and has no family history of premature coronary artery disease. He was seen in 2019 for a slow healing small ulceration on the left big toe. He underwent noninvasive vascular evaluation which showed near normal ABI.  Duplex showed mainly tibial disease with an occluded anterior tibial artery on the right and an occluded anterior tibial and peroneal arteries on the left.  The ulceration healed without need for revascularization. 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    He was suspected of TIA in September of last year with sudden loss of vision in the right eye which lasted for few minutes and was followed by visual disturbances and blurry vision. He was noted to have irregular heartbeat and there has been a concern about possible paroxysmal atrial fibrillation.  He had an echocardiogram done in November which showed normal LV systolic function with mild aortic stenosis. A 2 weeks ZIO monitor was performed which showed no evidence of atrial fibrillation.  However, he was noted to have short runs of SVT and frequent episodes of nonsustained ventricular tachycardia.  Due to that, he underwent a Lexiscan Myoview which showed evidence of prior inferior infarct without significant ischemia.  EF was mildly reduced. 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 He was seen in March and overall was  doing well with no reported exertional dyspnea or chest pain.  Thus, medical therapy was pursued.  However, he returns now reporting increased fatigue and exertional dyspnea with activities with occasional episodes of chest discomfort.  Past Medical History:  Diagnosis Date   Cancer (HCC)    basal cell both shoulders   Diabetes mellitus    insulin dependant   Hyperlipidemia     Past Surgical History:  Procedure Laterality Date   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   Proximal right great toe   repair of rt knee laceration       Current Outpatient Medications  Medication Sig Dispense Refill   ALPRAZolam (XANAX) 0.25 MG tablet TAKE 1 TABLET BY MOUTH EVERYDAY AT BEDTIME (Patient taking differently: as needed.) 90 tablet 0   aspirin EC 81 MG tablet Take 1 tablet (81 mg total) by mouth daily. 90 tablet 3   blood glucose meter kit and supplies KIT Dispense based on patient and insurance preference. ICD-10: E11.9 1 each 0   FARXIGA 10 MG TABS tablet TAKE 1 TABLET BY MOUTH DAILY BEFORE BREAKFAST. 30 tablet 6   gabapentin (NEURONTIN) 100 MG capsule Take 1 capsule (100 mg total) by mouth 3 (three) times daily. 90 capsule 3   glucose blood (ONETOUCH VERIO) test strip Check sugar 4 times daily 400 strip 3   glucose blood (ONETOUCH VERIO) test strip 1 each by Other route in the morning, at noon, in the evening, and at bedtime. Use as instructed 400 each 3   hydrochlorothiazide (HYDRODIURIL)  25 MG tablet TAKE 1 TABLET BY MOUTH EVERY DAY. NEED OFFICE VISIT 90 tablet 1   Insulin Glargine (BASAGLAR KWIKPEN) 100 UNIT/ML Inject 55 Units into the skin daily. 60 mL 3   insulin lispro (HUMALOG KWIKPEN) 200 UNIT/ML KwikPen Max daily 90 units (Patient taking differently: 30 Units with breakfast, with lunch, and with evening meal. Max daily 90 units) 45 mL 3   Insulin Pen Needle (BD PEN NEEDLE NANO 2ND GEN) 32G X 4 MM MISC USE 1 PEN NEEDLE DAILY 100 each 11   losartan (COZAAR) 100 MG tablet TAKE 1 TABLET  BY MOUTH EVERY DAY 90 tablet 1   metoCLOPramide (REGLAN) 5 MG tablet TAKE 1 TABLET BY MOUTH 3 TIMES A DAY BEFORE MEALS 270 tablet 1   OneTouch Delica Lancets 33G MISC 1 Device by Does not apply route in the morning, at noon, in the evening, and at bedtime. 400 each 3   pantoprazole (PROTONIX) 40 MG tablet TAKE 1 TABLET BY MOUTH EVERY DAY 90 tablet 3   rosuvastatin (CRESTOR) 40 MG tablet Take 1 tablet (40 mg total) by mouth daily. 90 tablet 3   sildenafil (VIAGRA) 100 MG tablet TAKE 1 TABLET 1 HOUR BEFORE INTERCOURSE 4 tablet 11   No current facility-administered medications for this visit.    Allergies:   Sulfa antibiotics    Social History:  The patient  reports that he has quit smoking. He has never used smokeless tobacco. He reports current alcohol use. He reports that he does not use drugs.   Family History:  The patient's family history includes Alzheimer's disease in his father and mother; COPD in his brother; Diabetes in his father and mother; Heart disease in his father; Mental illness in his father.    ROS:  Please see the history of present illness.   Otherwise, review of systems are positive for none.   All other systems are reviewed and negative.    PHYSICAL EXAM: VS:  BP (!) 130/50 (BP Location: Left Arm, Patient Position: Sitting, Cuff Size: Normal)   Pulse 69   Ht 5\' 8"  (1.727 m)   Wt 160 lb 6.4 oz (72.8 kg)   SpO2 97%   BMI 24.39 kg/m  , BMI Body mass index is 24.39 kg/m. GEN: Well nourished, well developed, in no acute distress  HEENT: normal  Neck: no JVD,  or masses.  Bilateral carotid bruits louder on the left side Cardiac: RRR; no rubs, or gallops,no edema .  2 out of 6 systolic murmur in the aortic area which is early peaking. Respiratory:  clear to auscultation bilaterally, normal work of breathing GI: soft, nontender, nondistended, + BS MS: no deformity or atrophy  Skin: warm and dry, no rash Neuro:  Strength and sensation are intact Psych: euthymic  mood, full affect    EKG:  EKG is  ordered today.  EKG showed normal sinus rhythm with prior inferior infarct.   Recent Labs: 10/05/2022: ALT 19; BUN 10; Creat 0.75; Hemoglobin 15.2; Platelets 188; Potassium 4.5; Sodium 140    Lipid Panel    Component Value Date/Time   CHOL 163 11/10/2022 0929   TRIG 134 11/10/2022 0929   HDL 55 11/10/2022 0929   CHOLHDL 3.0 11/10/2022 0929   VLDL 49 (H) 03/25/2017 1056   LDLCALC 85 11/10/2022 0929      Wt Readings from Last 3 Encounters:  05/18/23 160 lb 6.4 oz (72.8 kg)  04/27/23 160 lb (72.6 kg)  03/09/23 161 lb (73 kg)  No data to display            ASSESSMENT AND PLAN:  1.  Coronary artery disease involving native coronary arteries with other forms of angina:  His EKG and myocardial perfusion imaging is consistent with prior inferior infarct without significant ischemia.  Initially he did not report any symptoms back in March but now he returns with increased exertional dyspnea with activities as well as intermittent episodes of chest pain.   Due to that, I recommend proceeding with left heart catheterization and possible PCI.  I discussed the procedure in details as well as risk and benefits.  Planned access is via the right radial artery. Continue aspirin 81 mg once daily.  2.  Peripheral arterial disease: The patient has known tibial disease but currently has no claudication or lower extremity ulceration.  He did have right big toe surgery last year but that has healed completely.  Continue low-dose aspirin.    3.  amaurosis fugax: Likely due to small posterior inferior cerebellar artery infarct as confirmed by MRI.  No significant carotid disease.  4.  Aortic stenosis: This was mild on most recent echocardiogram and can be followed with surveillance every 2 to 3 years.  5.  Essential hypertension: Blood pressure is controlled.  6.  Hyperlipidemia: The dose of rosuvastatin was increased to 40 mg once daily  during last visit due to an LDL of 85.  7.  Nonsustained ventricular tachycardia: Completely asymptomatic.  Likely due to prior inferior scar.  His resting heart rate is on the low side and thus I did not add a beta-blocker.    Disposition:   Proceed with left heart catheterization next week and follow-up after.  Signed,  Lorine Bears, MD  05/18/2023 11:21 AM    [facility]

## 2023-05-19 LAB — BASIC METABOLIC PANEL
BUN/Creatinine Ratio: 14 (ref 10–24)
BUN: 18 mg/dL (ref 8–27)
CO2: 26 mmol/L (ref 20–29)
Calcium: 9.5 mg/dL (ref 8.6–10.2)
Chloride: 102 mmol/L (ref 96–106)
Creatinine, Ser: 1.3 mg/dL — ABNORMAL HIGH (ref 0.76–1.27)
Glucose: 55 mg/dL — ABNORMAL LOW (ref 70–99)
Potassium: 5 mmol/L (ref 3.5–5.2)
Sodium: 141 mmol/L (ref 134–144)
eGFR: 59 mL/min/{1.73_m2} — ABNORMAL LOW (ref >59)

## 2023-05-19 LAB — CBC
Hematocrit: 47.1 % (ref 37.5–51.0)
Hemoglobin: 15.7 g/dL (ref 13.0–17.7)
MCH: 29.9 pg (ref 26.6–33.0)
MCHC: 33.3 g/dL (ref 31.5–35.7)
MCV: 90 fL (ref 79–97)
Platelets: 214 10*3/uL (ref 150–450)
RBC: 5.25 x10E6/uL (ref 4.14–5.80)
RDW: 13.7 % (ref 11.6–15.4)
WBC: 7.8 10*3/uL (ref 3.4–10.8)

## 2023-05-24 ENCOUNTER — Telehealth: Payer: Self-pay | Admitting: *Deleted

## 2023-05-24 NOTE — Telephone Encounter (Signed)
Cardiac Catheterization scheduled at [facility] for: Wednesday May 26, 2023 12:30 PM Arrival time [facility] Main Entrance A at: 10:30 AM  Nothing to eat after midnight prior to procedure, clear liquids until 5 AM day of procedure.  Medication instructions: -Hold:  Insulin-AM of procedure/1/2/usual dose HS prior to procedure  Farxiga-AM of procedure  HCTZ/Losartan-day before and day of procedure-per protocol GFR 59-pt already taken today-will hold AM of procedure -Other usual morning medications can be taken with sips of water including aspirin 81 mg.  Confirmed patient has responsible adult to drive home post procedure and be with patient first 24 hours after arriving home.  Plan to go home the same day, you will only stay overnight if medically necessary.  Left message for patient to call back to review procedure instructions

## 2023-05-25 NOTE — Telephone Encounter (Signed)
Reviewed procedure instructions, discussed increasing fluid intake today.

## 2023-05-26 ENCOUNTER — Encounter (HOSPITAL_COMMUNITY)
Admission: RE | Disposition: A | Discharge status: Home / Self Care | Payer: Self-pay | Source: Home / Self Care | Attending: Cardiovascular Disease

## 2023-05-26 ENCOUNTER — Ambulatory Visit (HOSPITAL_COMMUNITY)
Admission: RE | Admit: 2023-05-26 | Discharge: 2023-05-26 | Disposition: A | Discharge status: Home / Self Care | Payer: Medicare HMO | Attending: Cardiovascular Disease | Admitting: Cardiovascular Disease

## 2023-05-26 ENCOUNTER — Other Ambulatory Visit (HOSPITAL_COMMUNITY): Payer: Self-pay

## 2023-05-26 ENCOUNTER — Other Ambulatory Visit: Payer: Self-pay

## 2023-05-26 DIAGNOSIS — Z79899 Other long term (current) drug therapy: Secondary | ICD-10-CM | POA: Diagnosis not present

## 2023-05-26 DIAGNOSIS — I350 Nonrheumatic aortic (valve) stenosis: Secondary | ICD-10-CM | POA: Diagnosis not present

## 2023-05-26 DIAGNOSIS — Z7982 Long term (current) use of aspirin: Secondary | ICD-10-CM | POA: Diagnosis not present

## 2023-05-26 DIAGNOSIS — Z87891 Personal history of nicotine dependence: Secondary | ICD-10-CM | POA: Diagnosis not present

## 2023-05-26 DIAGNOSIS — I25118 Atherosclerotic heart disease of native coronary artery with other forms of angina pectoris: Secondary | ICD-10-CM | POA: Insufficient documentation

## 2023-05-26 DIAGNOSIS — I2584 Coronary atherosclerosis due to calcified coronary lesion: Secondary | ICD-10-CM | POA: Insufficient documentation

## 2023-05-26 DIAGNOSIS — E785 Hyperlipidemia, unspecified: Secondary | ICD-10-CM | POA: Diagnosis present

## 2023-05-26 DIAGNOSIS — Z955 Presence of coronary angioplasty implant and graft: Secondary | ICD-10-CM | POA: Insufficient documentation

## 2023-05-26 DIAGNOSIS — Z794 Long term (current) use of insulin: Secondary | ICD-10-CM | POA: Insufficient documentation

## 2023-05-26 DIAGNOSIS — R9439 Abnormal result of other cardiovascular function study: Secondary | ICD-10-CM | POA: Insufficient documentation

## 2023-05-26 DIAGNOSIS — E1151 Type 2 diabetes mellitus with diabetic peripheral angiopathy without gangrene: Secondary | ICD-10-CM | POA: Insufficient documentation

## 2023-05-26 DIAGNOSIS — I739 Peripheral vascular disease, unspecified: Secondary | ICD-10-CM | POA: Diagnosis present

## 2023-05-26 DIAGNOSIS — Z8673 Personal history of transient ischemic attack (TIA), and cerebral infarction without residual deficits: Secondary | ICD-10-CM

## 2023-05-26 DIAGNOSIS — I10 Essential (primary) hypertension: Secondary | ICD-10-CM | POA: Diagnosis not present

## 2023-05-26 DIAGNOSIS — E119 Type 2 diabetes mellitus without complications: Secondary | ICD-10-CM

## 2023-05-26 DIAGNOSIS — R079 Chest pain, unspecified: Secondary | ICD-10-CM

## 2023-05-26 HISTORY — PX: CORONARY LITHOTRIPSY: CATH118330

## 2023-05-26 HISTORY — PX: LEFT HEART CATH AND CORONARY ANGIOGRAPHY: CATH118249

## 2023-05-26 HISTORY — PX: CORONARY STENT INTERVENTION: CATH118234

## 2023-05-26 LAB — POCT ACTIVATED CLOTTING TIME
Activated Clotting Time: 281 seconds
Activated Clotting Time: 452 seconds

## 2023-05-26 LAB — GLUCOSE, CAPILLARY
Glucose-Capillary: 182 mg/dL — ABNORMAL HIGH (ref 70–99)
Glucose-Capillary: 136 mg/dL — ABNORMAL HIGH (ref 70–99)

## 2023-05-26 SURGERY — LEFT HEART CATH AND CORONARY ANGIOGRAPHY
Anesthesia: LOCAL

## 2023-05-26 MED ORDER — PANTOPRAZOLE SODIUM 40 MG PO TBEC: 40.0000 mg | DELAYED_RELEASE_TABLET | Freq: Every day | ORAL | Status: DC

## 2023-05-26 MED ORDER — ASPIRIN 81 MG PO TBEC
81.0000 mg | DELAYED_RELEASE_TABLET | Freq: Every day | ORAL | 0 refills | Status: DC
Start: 2023-05-26 — End: 2023-05-26
  Filled 2023-05-26: qty 120, 120d supply, fill #0

## 2023-05-26 MED ORDER — NAPROXEN SODIUM 220 MG PO TABS: 440.0000 mg | ORAL_TABLET | Freq: Every day | ORAL | Status: DC | PRN

## 2023-05-26 MED ORDER — LIDOCAINE HCL (PF) 1 % IJ SOLN
INTRAMUSCULAR | Status: DC | PRN
  Administered 2023-05-26: 2 mL

## 2023-05-26 MED ORDER — IOHEXOL 350 MG/ML SOLN
INTRAVENOUS | Status: DC | PRN
  Administered 2023-05-26: 140 mL

## 2023-05-26 MED ORDER — HEPARIN SODIUM (PORCINE) 1000 UNIT/ML IJ SOLN
INTRAMUSCULAR | Status: AC
  Filled 2023-05-26: qty 10

## 2023-05-26 MED ORDER — SODIUM CHLORIDE 0.9% FLUSH: 3.0000 mL | Freq: Two times a day (BID) | INTRAVENOUS | Status: DC

## 2023-05-26 MED ORDER — FENTANYL CITRATE (PF) 100 MCG/2ML IJ SOLN
INTRAMUSCULAR | Status: DC | PRN
  Administered 2023-05-26: 25 ug via INTRAVENOUS

## 2023-05-26 MED ORDER — CLOPIDOGREL BISULFATE 75 MG PO TABS
75.0000 mg | ORAL_TABLET | Freq: Every day | ORAL | 11 refills | Status: DC
  Filled 2023-05-26: qty 30, 30d supply, fill #0

## 2023-05-26 MED ORDER — SODIUM CHLORIDE 0.9 % WEIGHT BASED INFUSION: 1.0000 mL/kg/h | INTRAVENOUS | Status: DC

## 2023-05-26 MED ORDER — ASPIRIN 81 MG PO CHEW
81.0000 mg | CHEWABLE_TABLET | ORAL | Status: AC
  Administered 2023-05-26: 81 mg via ORAL
  Filled 2023-05-26: qty 1

## 2023-05-26 MED ORDER — CLOPIDOGREL BISULFATE 300 MG PO TABS
ORAL_TABLET | ORAL | Status: DC | PRN
  Administered 2023-05-26: 600 mg via ORAL

## 2023-05-26 MED ORDER — DAPAGLIFLOZIN PROPANEDIOL 10 MG PO TABS
10.0000 mg | ORAL_TABLET | Freq: Every day | ORAL | Status: DC
  Filled 2023-05-26: qty 1

## 2023-05-26 MED ORDER — SODIUM CHLORIDE 0.9 % WEIGHT BASED INFUSION
3.0000 mL/kg/h | INTRAVENOUS | Status: AC
  Administered 2023-05-26: 3 mL/kg/h via INTRAVENOUS

## 2023-05-26 MED ORDER — MIDAZOLAM HCL 2 MG/2ML IJ SOLN
INTRAMUSCULAR | Status: AC
  Filled 2023-05-26: qty 2

## 2023-05-26 MED ORDER — ASPIRIN EC 81 MG PO TBEC: 81.0000 mg | DELAYED_RELEASE_TABLET | Freq: Every day | ORAL | 3 refills | Status: AC

## 2023-05-26 MED ORDER — FLUOROURACIL 5 % EX CREA: 1.0000 | TOPICAL_CREAM | Freq: Every day | CUTANEOUS | Status: DC

## 2023-05-26 MED ORDER — CLOPIDOGREL BISULFATE 300 MG PO TABS
ORAL_TABLET | ORAL | Status: AC
  Filled 2023-05-26: qty 2

## 2023-05-26 MED ORDER — LIDOCAINE HCL (PF) 1 % IJ SOLN
INTRAMUSCULAR | Status: AC
  Filled 2023-05-26: qty 30

## 2023-05-26 MED ORDER — SODIUM CHLORIDE 0.9 % IV SOLN: 250.0000 mL | INTRAVENOUS | Status: DC | PRN

## 2023-05-26 MED ORDER — FAMOTIDINE IN NACL 20-0.9 MG/50ML-% IV SOLN
INTRAVENOUS | Status: AC
  Filled 2023-05-26: qty 50

## 2023-05-26 MED ORDER — VERAPAMIL HCL 2.5 MG/ML IV SOLN
INTRAVENOUS | Status: AC
  Filled 2023-05-26: qty 2

## 2023-05-26 MED ORDER — HEPARIN (PORCINE) IN NACL 1000-0.9 UT/500ML-% IV SOLN
INTRAVENOUS | Status: DC | PRN
  Administered 2023-05-26 (×2): 500 mL

## 2023-05-26 MED ORDER — SODIUM CHLORIDE 0.9% FLUSH: 3.0000 mL | INTRAVENOUS | Status: DC | PRN

## 2023-05-26 MED ORDER — NITROGLYCERIN 1 MG/10 ML FOR IR/CATH LAB
INTRA_ARTERIAL | Status: AC
  Filled 2023-05-26: qty 10

## 2023-05-26 MED ORDER — FAMOTIDINE IN NACL 20-0.9 MG/50ML-% IV SOLN
INTRAVENOUS | Status: DC | PRN
  Administered 2023-05-26: 20 mg via INTRAVENOUS

## 2023-05-26 MED ORDER — MIDAZOLAM HCL 2 MG/2ML IJ SOLN
INTRAMUSCULAR | Status: DC | PRN
  Administered 2023-05-26: 1 mg via INTRAVENOUS

## 2023-05-26 MED ORDER — INSULIN LISPRO 200 UNIT/ML ~~LOC~~ SOPN: 25.0000 [IU] | PEN_INJECTOR | Freq: Three times a day (TID) | SUBCUTANEOUS | Status: DC

## 2023-05-26 MED ORDER — FENTANYL CITRATE (PF) 100 MCG/2ML IJ SOLN
INTRAMUSCULAR | Status: AC
  Filled 2023-05-26: qty 2

## 2023-05-26 MED ORDER — ACETAMINOPHEN 325 MG PO TABS: 650.0000 mg | ORAL_TABLET | ORAL | Status: DC | PRN

## 2023-05-26 MED ORDER — BASAGLAR KWIKPEN 100 UNIT/ML ~~LOC~~ SOPN: 55.0000 [IU] | PEN_INJECTOR | Freq: Every day | SUBCUTANEOUS | Status: DC

## 2023-05-26 MED ORDER — NITROGLYCERIN 0.4 MG SL SUBL
0.4000 mg | SUBLINGUAL_TABLET | SUBLINGUAL | 2 refills | Status: AC | PRN
  Filled 2023-05-26: qty 25, 7d supply, fill #0

## 2023-05-26 MED ORDER — NITROGLYCERIN 1 MG/10 ML FOR IR/CATH LAB
INTRA_ARTERIAL | Status: DC | PRN
  Administered 2023-05-26 (×2): 200 ug via INTRACORONARY

## 2023-05-26 MED ORDER — INSULIN ASPART 100 UNIT/ML IJ SOLN: 25.0000 [IU] | Freq: Three times a day (TID) | INTRAMUSCULAR | Status: DC

## 2023-05-26 MED ORDER — NAPROXEN 250 MG PO TABS: 500.0000 mg | ORAL_TABLET | Freq: Every day | ORAL | Status: DC | PRN

## 2023-05-26 MED ORDER — VERAPAMIL HCL 2.5 MG/ML IV SOLN
INTRAVENOUS | Status: DC | PRN
  Administered 2023-05-26: 10 mL via INTRA_ARTERIAL

## 2023-05-26 MED ORDER — CLOPIDOGREL BISULFATE 75 MG PO TABS: 75.0000 mg | ORAL_TABLET | Freq: Every day | ORAL | Status: DC

## 2023-05-26 MED ORDER — CLOPIDOGREL BISULFATE 75 MG PO TABS
75.0000 mg | ORAL_TABLET | Freq: Every day | ORAL | 12 refills | Status: DC
  Filled 2023-05-26: qty 30, 30d supply, fill #0
  Filled 2023-05-26: qty 90, 90d supply, fill #0

## 2023-05-26 MED ORDER — ROSUVASTATIN CALCIUM 40 MG PO TABS
40.0000 mg | ORAL_TABLET | Freq: Every day | ORAL | Status: DC
  Filled 2023-05-26: qty 1

## 2023-05-26 MED ORDER — HYDROCHLOROTHIAZIDE 25 MG PO TABS
25.0000 mg | ORAL_TABLET | Freq: Every day | ORAL | Status: DC
  Filled 2023-05-26: qty 1

## 2023-05-26 MED ORDER — SODIUM CHLORIDE 0.9 % IV SOLN: INTRAVENOUS | Status: DC

## 2023-05-26 MED ORDER — LOSARTAN POTASSIUM 50 MG PO TABS: 100.0000 mg | ORAL_TABLET | Freq: Every day | ORAL | Status: DC

## 2023-05-26 MED ORDER — INSULIN GLARGINE-YFGN 100 UNIT/ML ~~LOC~~ SOLN
55.0000 [IU] | Freq: Every day | SUBCUTANEOUS | Status: DC
  Filled 2023-05-26: qty 0.55

## 2023-05-26 MED ORDER — ACETAMINOPHEN 500 MG PO TABS: 500.0000 mg | ORAL_TABLET | Freq: Four times a day (QID) | ORAL | Status: DC | PRN

## 2023-05-26 MED ORDER — ASPIRIN 81 MG PO CHEW: 81.0000 mg | CHEWABLE_TABLET | Freq: Every day | ORAL | Status: DC

## 2023-05-26 MED ORDER — HEPARIN SODIUM (PORCINE) 1000 UNIT/ML IJ SOLN
INTRAMUSCULAR | Status: DC | PRN
  Administered 2023-05-26: 3500 [IU] via INTRAVENOUS
  Administered 2023-05-26: 4000 [IU] via INTRAVENOUS

## 2023-05-26 MED ORDER — ONDANSETRON HCL 4 MG/2ML IJ SOLN: 4.0000 mg | Freq: Four times a day (QID) | INTRAMUSCULAR | Status: DC | PRN

## 2023-05-26 SURGICAL SUPPLY — 22 items
BALLN EMERGE MR 2.5X12 (BALLOONS) ×1
BALLN ~~LOC~~ EMERGE MR 3.0X30 (BALLOONS) ×1
BALLOON EMERGE MR 2.5X12 (BALLOONS) IMPLANT
BALLOON ~~LOC~~ EMERGE MR 3.0X30 (BALLOONS) IMPLANT
CATH INFINITI 5FR JK (CATHETERS) IMPLANT
CATH INFINITI JR4 5F (CATHETERS) IMPLANT
CATH LAUNCHER 6FR JR4 (CATHETERS) IMPLANT
CATH SHOCKWAVE C2 2.5X12 (CATHETERS) IMPLANT
CATH SHOCKWAVE C2 3.0X12 (CATHETERS) IMPLANT
ELECT DEFIB PAD ADLT CADENCE (PAD) IMPLANT
GLIDESHEATH SLEND SS 6F .021 (SHEATH) IMPLANT
GUIDEWIRE INQWIRE 1.5J.035X260 (WIRE) IMPLANT
INQWIRE 1.5J .035X260CM (WIRE) ×1
KIT ENCORE 26 ADVANTAGE (KITS) IMPLANT
KIT HEART LEFT (KITS) ×1 IMPLANT
PACK CARDIAC CATHETERIZATION (CUSTOM PROCEDURE TRAY) ×1 IMPLANT
SHEATH GLIDE SLENDER 4/5FR (SHEATH) IMPLANT
STENT SYNERGY XD 2.75X48 (Permanent Stent) IMPLANT
SYNERGY XD 2.75X48 (Permanent Stent) ×1 IMPLANT
TRANSDUCER W/STOPCOCK (MISCELLANEOUS) ×1 IMPLANT
TUBING CIL FLEX 10 FLL-RA (TUBING) ×1 IMPLANT
WIRE RUNTHROUGH .014X180CM (WIRE) IMPLANT

## 2023-05-26 NOTE — Progress Notes (Signed)
TR BAND REMOVAL  LOCATION:    right radial  DEFLATED PER PROTOCOL:    Yes.    TIME BAND OFF / DRESSING APPLIED:    1640 gauze dressing applied   SITE UPON ARRIVAL:    Level 0  SITE AFTER BAND REMOVAL:    Level 0  CIRCULATION SENSATION AND MOVEMENT:    Within Normal Limits   Yes.    COMMENTS:   no issues noted

## 2023-05-26 NOTE — Discharge Summary (Signed)
Discharge Summary for Same Day PCI   Patient ID: Joseph Dixon MRN: [id number]; DOB: Apr 14, 1951  Admit date: 05/26/2023 Discharge date: 05/26/2023  Primary Care Provider: Margaree Mackintosh, MD  Primary Cardiologist: Lorine Bears, MD  Primary Electrophysiologist:  None   Discharge Diagnoses    Principal Problem:   Chest pain of uncertain etiology Active Problems:   Abnormal stress test   Hyperlipidemia   PAD (peripheral artery disease) (HCC)   Diabetes mellitus (HCC)   Hypertension   History of CVA (cerebrovascular accident)    Diagnostic Studies/Procedures    Cardiac Catheterization 05/26/2023:   Suezanne Jacquet LM lesion is 20% stenosed.   Prox LAD to Mid LAD lesion is 20% stenosed.   2nd Diag lesion is 50% stenosed.   Prox Cx to Mid Cx lesion is 30% stenosed.   Dist RCA lesion is 60% stenosed.   Prox RCA to Mid RCA lesion is 70% stenosed.   Ost RCA to Prox RCA lesion is 95% stenosed.   A drug-eluting stent was successfully placed using a SYNERGY XD [id number].   Post intervention, there is a 0% residual stenosis.   Post intervention, there is a 0% residual stenosis.   The left ventricular systolic function is normal.   LV end diastolic pressure is mildly elevated.   The left ventricular ejection fraction is 55-65% by visual estimate.   1.  Heavily calcified coronary arteries with severe one-vessel coronary artery disease involving a codominant right coronary artery. 2.  Normal LV systolic function and mildly elevated left ventricular end-diastolic pressure. 3.  Successful intravascular lithotripsy and drug-eluting stent placement to the right coronary artery.   Recommendations: Dual antiplatelet therapy for at least 6 months. Aggressive treatment of risk factors.  Diagnostic Dominance: Co-dominant  Intervention      _____________   History of Present Illness     Joseph Dixon is a 72 y.o. male with a history of abnormal Myoview in 12/2022, aortic stenosis, non-sustained  VT, PAD of bilateral lower extremities, prior stoke, hypertension, hyperlipidemia, and insulin dependent diabetes who is followed by Dr. Kirke Corin. Patient underwent a Myoview in 12/2022 for further evaluation of frequent NSVT. This was abnormal and showed findings consistent with a prior infarction of inferior myocardium. He was not having any anginal symptoms at that time so no additional work-up was felt to be needed at that time. He was recently seen by Dr. Kirke Corin on 05/18/2023 at which time he reported increased fatigue and exertional dyspnea as well as occasional episodes of chest pain. Therefore, a cardiac catheterization was arranged for further evaluation.  Hospital Course     Patient presented to [facility] on 05/25/2024 for planned cardiac catheterization. LHC showed heavily calcified coronary arteries and severe one vessel CAD with 95% stenosis of the ostial to proximal RCA followed by 70% stenosis of proximal to mid RCA and 60% stenosis of distal RCA. Otherwise, only non-obstructive disease. LVEF 55-65% by visual estimate. Patient underwent successful intravascular lithotripsy and DES to RCA. He tolerated the procedure well.Plan is for for DAPT with Aspirin 81mg  daily and Plavix 75mg  daily for at least 6 months. The patient was seen by Cardiac Rehab while in short stay. There were no observed complications post cath. Right radial cath site was re-evaluated prior to discharge and found to be stable without any complications. Instructions/precautions regarding cath site care were given prior to discharge.  Joseph Dixon was seen by Dr. Kirke Corin and determined stable for discharge home. Follow up with  our office has been arranged. Medications are listed below. Pertinent changes include addition of Plavix. He was taking Aspirin 162mg  daily prior to admission so this was decreased to 81mg  daily. He was also provided a prescription of PNR sublingual Nitroglycerin.  Counseled patient on not taking  Nitroglycerin within 48 hours of any erectile dysfunction drugs. _____________  Cath/PCI Registry Performance & Quality Measures: Aspirin prescribed? - Yes ADP Receptor Inhibitor (Plavix/Clopidogrel, Brilinta/Ticagrelor or Effient/Prasugrel) prescribed (includes medically managed patients)? - Yes High Intensity Statin (Lipitor 40-80mg  or Crestor 20-40mg ) prescribed? - Yes For EF <40%, was ACEI/ARB prescribed? - Not Applicable (EF >/= 40%) For EF <40%, Aldosterone Antagonist (Spironolactone or Eplerenone) prescribed? - Not Applicable (EF >/= 40%) Cardiac Rehab Phase II ordered (Included Medically managed Patients)? - Yes  _____________   Discharge Vitals Blood pressure (!) 136/52, pulse 82, temperature 98.3 F (36.8 C), temperature source Temporal, resp. rate 16, height 5\' 8"  (1.727 m), weight 72.6 kg, SpO2 98 %.  Filed Weights   05/26/23 1137  Weight: 72.6 kg    Last Labs & Radiologic Studies    CBC No results for input(s): "WBC", "NEUTROABS", "HGB", "HCT", "MCV", "PLT" in the last 72 hours. Basic Metabolic Panel No results for input(s): "NA", "K", "CL", "CO2", "GLUCOSE", "BUN", "CREATININE", "CALCIUM", "MG", "PHOS" in the last 72 hours. Liver Function Tests No results for input(s): "AST", "ALT", "ALKPHOS", "BILITOT", "PROT", "ALBUMIN" in the last 72 hours. No results for input(s): "LIPASE", "AMYLASE" in the last 72 hours. High Sensitivity Troponin:   No results for input(s): "TROPONINIHS" in the last 720 hours.  BNP Invalid input(s): "POCBNP" D-Dimer No results for input(s): "DDIMER" in the last 72 hours. Hemoglobin A1C No results for input(s): "HGBA1C" in the last 72 hours. Fasting Lipid Panel No results for input(s): "CHOL", "HDL", "LDLCALC", "TRIG", "CHOLHDL", "LDLDIRECT" in the last 72 hours. Thyroid Function Tests No results for input(s): "TSH", "T4TOTAL", "T3FREE", "THYROIDAB" in the last 72 hours.  Invalid input(s): "FREET3" _____________  CARDIAC  CATHETERIZATION  Result Date: 05/26/2023   Ost LM lesion is 20% stenosed.   Prox LAD to Mid LAD lesion is 20% stenosed.   2nd Diag lesion is 50% stenosed.   Prox Cx to Mid Cx lesion is 30% stenosed.   Dist RCA lesion is 60% stenosed.   Prox RCA to Mid RCA lesion is 70% stenosed.   Ost RCA to Prox RCA lesion is 95% stenosed.   A drug-eluting stent was successfully placed using a SYNERGY XD [id number].   Post intervention, there is a 0% residual stenosis.   Post intervention, there is a 0% residual stenosis.   The left ventricular systolic function is normal.   LV end diastolic pressure is mildly elevated.   The left ventricular ejection fraction is 55-65% by visual estimate. 1.  Heavily calcified coronary arteries with severe one-vessel coronary artery disease involving a codominant right coronary artery. 2.  Normal LV systolic function and mildly elevated left ventricular end-diastolic pressure. 3.  Successful intravascular lithotripsy and drug-eluting stent placement to the right coronary artery. Recommendations: Dual antiplatelet therapy for at least 6 months. Aggressive treatment of risk factors.    Disposition   Patient is being discharged home today in good condition.  Follow-up Plans & Appointments     Follow-up Information     Iran Ouch, MD Follow up.   Specialty: Cardiology Why: Please keep appointment with Dr. Kirke Corin which is scheduled for 06/15/2023 at 8:00am. Contact information: [address] [place] [address] [phone]  Discharge Instructions     Amb Referral to Cardiac Rehabilitation   Complete by: As directed    Diagnosis: Coronary Stents   After initial evaluation and assessments completed: Virtual Based Care may be provided alone or in conjunction with Phase 2 Cardiac Rehab based on patient barriers.: Yes   Intensive Cardiac Rehabilitation (ICR) [facility] location only OR Traditional Cardiac Rehabilitation (TCR) *If criteria for ICR are  not met will enroll in TCR [facility] only): Yes        Discharge Medications   Allergies as of 05/26/2023       Reactions   Sulfa Antibiotics    hallucinations        Medication List     STOP taking these medications    naproxen sodium 220 MG tablet Commonly known as: ALEVE       TAKE these medications    acetaminophen 500 MG tablet Commonly known as: TYLENOL Take 500 mg by mouth every 6 (six) hours as needed for moderate pain.   aspirin EC 81 MG tablet Take 1 tablet (81 mg total) by mouth daily. What changed: how much to take   Basaglar KwikPen 100 UNIT/ML Inject 55 Units into the skin daily. What changed: when to take this   BD Pen Needle Nano 2nd Gen 32G X 4 MM Misc Generic drug: Insulin Pen Needle USE 1 PEN NEEDLE DAILY   blood glucose meter kit and supplies Kit Dispense based on patient and insurance preference. ICD-10: E11.9   clopidogrel 75 MG tablet Commonly known as: Plavix Take 1 tablet (75 mg total) by mouth daily.   Farxiga 10 MG Tabs tablet Generic drug: dapagliflozin propanediol TAKE 1 TABLET BY MOUTH DAILY BEFORE BREAKFAST.   fluorouracil 5 % cream Commonly known as: EFUDEX Apply 1 Application topically daily.   HumaLOG KwikPen 200 UNIT/ML KwikPen Generic drug: insulin lispro Max daily 90 units What changed:  how much to take how to take this when to take this additional instructions   hydrochlorothiazide 25 MG tablet Commonly known as: HYDRODIURIL TAKE 1 TABLET BY MOUTH EVERY DAY. NEED OFFICE VISIT   losartan 100 MG tablet Commonly known as: COZAAR TAKE 1 TABLET BY MOUTH EVERY DAY   nitroGLYCERIN 0.4 MG SL tablet Commonly known as: Nitrostat Place 1 tablet (0.4 mg total) under the tongue every 5 (five) minutes as needed for chest pain.   OneTouch Delica Lancets 33G Misc 1 Device by Does not apply route in the morning, at noon, in the evening, and at bedtime.   OneTouch Verio test strip Generic drug: glucose blood Check  sugar 4 times daily   OneTouch Verio test strip Generic drug: glucose blood 1 each by Other route in the morning, at noon, in the evening, and at bedtime. Use as instructed   pantoprazole 40 MG tablet Commonly known as: PROTONIX TAKE 1 TABLET BY MOUTH EVERY DAY   rosuvastatin 40 MG tablet Commonly known as: CRESTOR Take 1 tablet (40 mg total) by mouth daily.           Allergies Allergies  Allergen Reactions   Sulfa Antibiotics     hallucinations    Outstanding Labs/Studies   N/A  Duration of Discharge Encounter   Greater than 30 minutes including physician time.  Signed, Corrin Parker, PA-C 05/26/2023, 5:27 PM

## 2023-05-26 NOTE — Progress Notes (Signed)
CARDIAC REHAB PHASE I   Post stent education including site care, risk factors, restrictions, heart healthy diabetic diet, antiplatelet therapy importance, exercise guidelines, NTG use and CRP2 reviewed. All questions and concerns addressed. Will refer to [facility] for CRP2. Plan for home later today.   [id number]  Woodroe Chen, RN BSN 05/26/2023 3:11 PM

## 2023-05-26 NOTE — Interval H&P Note (Signed)
History and Physical Interval Note:  05/26/2023 12:54 PM  Joseph Dixon  has presented today for surgery, with the diagnosis of chest pain - abnormal stress test.  The various methods of treatment have been discussed with the patient and family. After consideration of risks, benefits and other options for treatment, the patient has consented to  Procedure(s): LEFT HEART CATH AND CORONARY ANGIOGRAPHY (N/A) as a surgical intervention.  The patient's history has been reviewed, patient examined, no change in status, stable for surgery.  I have reviewed the patient's chart and labs.  Questions were answered to the patient's satisfaction.     Lorine Bears

## 2023-05-26 NOTE — Discharge Instructions (Signed)

## 2023-05-27 ENCOUNTER — Other Ambulatory Visit (HOSPITAL_COMMUNITY): Payer: Self-pay

## 2023-05-27 ENCOUNTER — Encounter (HOSPITAL_COMMUNITY): Payer: Self-pay | Admitting: Cardiovascular Disease

## 2023-06-03 DIAGNOSIS — Z85820 Personal history of malignant melanoma of skin: Secondary | ICD-10-CM | POA: Diagnosis not present

## 2023-06-03 DIAGNOSIS — C4359 Malignant melanoma of other part of trunk: Secondary | ICD-10-CM | POA: Diagnosis not present

## 2023-06-07 ENCOUNTER — Telehealth (HOSPITAL_COMMUNITY): Payer: Self-pay

## 2023-06-07 NOTE — Telephone Encounter (Signed)
Per Phase 1 Cardiac rehab fax referral to [facility]

## 2023-06-15 ENCOUNTER — Ambulatory Visit: Payer: Medicare HMO | Attending: Cardiovascular Disease | Admitting: Cardiovascular Disease

## 2023-06-15 ENCOUNTER — Encounter: Payer: Self-pay | Admitting: Cardiovascular Disease

## 2023-06-15 VITALS — BP 124/60 | HR 77 | Ht 68.0 in | Wt 158.6 lb

## 2023-06-15 DIAGNOSIS — I351 Nonrheumatic aortic (valve) insufficiency: Secondary | ICD-10-CM

## 2023-06-15 DIAGNOSIS — E785 Hyperlipidemia, unspecified: Secondary | ICD-10-CM | POA: Diagnosis not present

## 2023-06-15 DIAGNOSIS — I739 Peripheral vascular disease, unspecified: Secondary | ICD-10-CM

## 2023-06-15 DIAGNOSIS — I10 Essential (primary) hypertension: Secondary | ICD-10-CM | POA: Diagnosis not present

## 2023-06-15 DIAGNOSIS — I25118 Atherosclerotic heart disease of native coronary artery with other forms of angina pectoris: Secondary | ICD-10-CM

## 2023-06-15 NOTE — Progress Notes (Signed)
Cardiology Office Note   Date:  06/15/2023   ID:  Joseph Dixon, DOB July 11, 1951, MRN [id number]  PCP:  Margaree Mackintosh, MD  Cardiologist:   Lorine Bears, MD   No chief complaint on file.      History of Present Illness: Joseph Dixon is a 72 y.o. male who is here today  for a follow-up visit regarding peripheral arterial disease and coronary artery disease.  The patient has known history of diabetes mellitus, hypertension and hyperlipidemia.   He is not a smoker and has no family history of premature coronary artery disease. He was seen in 2019 for a slow healing small ulceration on the left big toe. He underwent noninvasive vascular evaluation which showed near normal ABI.  Duplex showed mainly tibial disease with an occluded anterior tibial artery on the right and an occluded anterior tibial and peroneal arteries on the left.  The ulceration healed without need for revascularization. 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    He was suspected of TIA in September of last year with sudden loss of vision in the right eye which lasted for few minutes and was followed by visual disturbances and blurry vision. He was noted to have irregular heartbeat and there has been a concern about possible paroxysmal atrial fibrillation.  He had an echocardiogram done in November which showed normal LV systolic function with mild aortic stenosis. A 2 weeks ZIO monitor was performed which showed no evidence of atrial fibrillation.  However, he was noted to have short runs of SVT and frequent episodes of nonsustained ventricular tachycardia.  Due to that, he underwent a Lexiscan Myoview which showed evidence of prior inferior infarct without significant ischemia.  EF was mildly reduced. 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  He was seen earlier this month for  increased exertional dyspnea and occasional chest discomfort.  I proceeded with left heart catheterization on June 5 which showed heavily calcified coronary arteries with severe one-vessel coronary artery disease involving a codominant right coronary artery.  Ejection fraction was normal with mildly elevated left ventricular end-diastolic pressure.  I performed successful intravascular lithotripsy and drug-eluting stent placement to the right coronary artery. He reports no significant improvement in exertional dyspnea since PCI.  He did have an episode of chest pain at night that lasted for few hours but subsided by the next day and have not had any since then even with exertion.   Past Medical History:  Diagnosis Date   Cancer (HCC)    basal cell both shoulders   Diabetes mellitus    insulin dependant   Hyperlipidemia     Past Surgical History:  Procedure Laterality Date   CORONARY LITHOTRIPSY N/A 05/26/2023   Procedure: CORONARY LITHOTRIPSY;  Surgeon: Iran Ouch, MD;  [facility];  Service: Cardiovascular;  Laterality: N/A;   CORONARY STENT INTERVENTION N/A 05/26/2023   Procedure: CORONARY STENT INTERVENTION;  Surgeon: Iran Ouch, MD;  Location: [facility];  Service: Cardiovascular;  Laterality: N/A;   LEFT HEART CATH AND CORONARY ANGIOGRAPHY N/A 05/26/2023   Procedure: LEFT HEART CATH AND CORONARY ANGIOGRAPHY;  Surgeon: Iran Ouch, MD;  Location: [facility];  Service: Cardiovascular;  Laterality: N/A;   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   Proximal right great toe   repair of rt knee laceration  Current Outpatient Medications  Medication Sig Dispense Refill   acetaminophen (TYLENOL) 500 MG tablet Take 500 mg by mouth every 6 (six) hours as needed for moderate pain.     aspirin EC 81 MG tablet Take 1 tablet (81 mg total) by mouth daily. 90 tablet 3   blood glucose meter kit and supplies KIT Dispense based on patient and  insurance preference. ICD-10: E11.9 1 each 0   clopidogrel (PLAVIX) 75 MG tablet Take 1 tablet (75 mg total) by mouth daily. 30 tablet 12   FARXIGA 10 MG TABS tablet TAKE 1 TABLET BY MOUTH DAILY BEFORE BREAKFAST. 30 tablet 6   glucose blood (ONETOUCH VERIO) test strip Check sugar 4 times daily 400 strip 3   glucose blood (ONETOUCH VERIO) test strip 1 each by Other route in the morning, at noon, in the evening, and at bedtime. Use as instructed 400 each 3   hydrochlorothiazide (HYDRODIURIL) 25 MG tablet TAKE 1 TABLET BY MOUTH EVERY DAY. NEED OFFICE VISIT 90 tablet 1   Insulin Glargine (BASAGLAR KWIKPEN) 100 UNIT/ML Inject 55 Units into the skin daily. (Patient taking differently: Inject 55 Units into the skin at bedtime.) 60 mL 3   insulin lispro (HUMALOG KWIKPEN) 200 UNIT/ML KwikPen Max daily 90 units (Patient taking differently: Inject 25-30 Units into the skin with breakfast, with lunch, and with evening meal.) 45 mL 3   Insulin Pen Needle (BD PEN NEEDLE NANO 2ND GEN) 32G X 4 MM MISC USE 1 PEN NEEDLE DAILY 100 each 11   losartan (COZAAR) 100 MG tablet TAKE 1 TABLET BY MOUTH EVERY DAY 90 tablet 1   nitroGLYCERIN (NITROSTAT) 0.4 MG SL tablet Place 1 tablet (0.4 mg total) under the tongue every 5 (five) minutes as needed for chest pain. 25 tablet 2   OneTouch Delica Lancets 33G MISC 1 Device by Does not apply route in the morning, at noon, in the evening, and at bedtime. 400 each 3   pantoprazole (PROTONIX) 40 MG tablet TAKE 1 TABLET BY MOUTH EVERY DAY 90 tablet 3   rosuvastatin (CRESTOR) 40 MG tablet Take 1 tablet (40 mg total) by mouth daily. 90 tablet 3   fluorouracil (EFUDEX) 5 % cream Apply 1 Application topically daily. (Patient not taking: Reported on 06/15/2023)     No current facility-administered medications for this visit.    Allergies:   Sulfa antibiotics    Social History:  The patient  reports that he has quit smoking. He has never used smokeless tobacco. He reports current alcohol  use. He reports that he does not use drugs.   Family History:  The patient's family history includes Alzheimer's disease in his father and mother; COPD in his brother; Diabetes in his father and mother; Heart disease in his father; Mental illness in his father.    ROS:  Please see the history of present illness.   Otherwise, review of systems are positive for none.   All other systems are reviewed and negative.    PHYSICAL EXAM: VS:  BP 124/60 (BP Location: Left Arm, Patient Position: Sitting, Cuff Size: Normal)   Pulse 77   Ht 5\' 8"  (1.727 m)   Wt 158 lb 9.6 oz (71.9 kg)   SpO2 97%   BMI 24.12 kg/m  , BMI Body mass index is 24.12 kg/m. GEN: Well nourished, well developed, in no acute distress  HEENT: normal  Neck: no JVD,  or masses.  Bilateral carotid bruits louder on the left side Cardiac: RRR; no rubs,  or gallops,no edema .  2 out of 6 systolic murmur in the aortic area which is early peaking. Respiratory:  clear to auscultation bilaterally, normal work of breathing GI: soft, nontender, nondistended, + BS MS: no deformity or atrophy  Skin: warm and dry, no rash Neuro:  Strength and sensation are intact Psych: euthymic mood, full affect Right radial pulse is normal with no hematoma.   EKG:  EKG is  ordered today.  EKG showed normal sinus rhythm with prior inferior infarct.  Sinus arrhythmia   Recent Labs: 10/05/2022: ALT 19 05/18/2023: BUN 18; Creatinine, Ser 1.30; Hemoglobin 15.7; Platelets 214; Potassium 5.0; Sodium 141    Lipid Panel    Component Value Date/Time   CHOL 163 11/10/2022 0929   TRIG 134 11/10/2022 0929   HDL 55 11/10/2022 0929   CHOLHDL 3.0 11/10/2022 0929   VLDL 49 (H) 03/25/2017 1056   LDLCALC 85 11/10/2022 0929      Wt Readings from Last 3 Encounters:  06/15/23 158 lb 9.6 oz (71.9 kg)  05/26/23 160 lb (72.6 kg)  05/18/23 160 lb 6.4 oz (72.8 kg)           No data to display            ASSESSMENT AND PLAN:  1.  Coronary artery  disease involving native coronary arteries with other forms of angina: Status post recent PCI and drug-eluting stent placement to the right coronary artery.  His coronary arteries were noted to be overall heavily calcified.  No other obstructive disease involving the LAD and left circumflex.   I strongly advised him to attend cardiac rehab. Continue dual antiplatelet therapy for at least 6 months and preferably longer given very long ostial RCA stent.  2.  Peripheral arterial disease: The patient has known tibial disease but currently has no claudication or lower extremity ulceration.  Continue medical therapy  3.  amaurosis fugax: Likely due to small posterior inferior cerebellar artery infarct as confirmed by MRI.  No significant carotid disease.  4.  Aortic stenosis: This was mild on most recent echocardiogram and can be followed with surveillance every 2 to 3 years.  5.  Essential hypertension: Blood pressure is controlled.  6.  Hyperlipidemia: The dose of rosuvastatin was increased to 40 mg once daily during last visit due to an LDL of 85.  7.  Nonsustained ventricular tachycardia: Completely asymptomatic.  Likely due to prior inferior scar.  His resting heart rate is on the low side and thus I did not add a beta-blocker.    Disposition:   Follow-up in 6 months.  Signed,  Lorine Bears, MD  06/15/2023 8:36 AM    [facility]

## 2023-06-15 NOTE — Patient Instructions (Addendum)
Medication Instructions:   Your physician recommends that you continue on your current medications as directed. Please refer to the Current Medication list given to you today.  *If you need a refill on your cardiac medications before your next appointment, please call your pharmacy*  Lab Work: NONE ordered at this time of appointment   If you have labs (blood work) drawn today and your tests are completely normal, you will receive your results only by: MyChart Message (if you have MyChart) OR A paper copy in the mail If you have any lab test that is abnormal or we need to change your treatment, we will call you to review the results.  Testing/Procedures: NONE ordered at this time of appointment   Follow-Up: At [facility], you and your health needs are our priority.  As part of our continuing mission to provide you with exceptional heart care, we have created designated Provider Care Teams.  These Care Teams include your primary Cardiologist (physician) and Advanced Practice Providers (APPs -  Physician Assistants and Nurse Practitioners) who all work together to provide you with the care you need, when you need it.  We recommend signing up for the patient portal called "MyChart".  Sign up information is provided on this After Visit Summary.  MyChart is used to connect with patients for Virtual Visits (Telemedicine).  Patients are able to view lab/test results, encounter notes, upcoming appointments, etc.  Non-urgent messages can be sent to your provider as well.   To learn more about what you can do with MyChart, go to [email or web address].    Your next appointment:   6 month(s)  Provider:   Lorine Bears, MD     Other Instructions

## 2023-07-13 ENCOUNTER — Ambulatory Visit: Payer: Medicare HMO | Admitting: Cardiovascular Disease

## 2023-07-18 ENCOUNTER — Other Ambulatory Visit: Payer: Self-pay | Admitting: Internal Medicine

## 2023-08-21 ENCOUNTER — Other Ambulatory Visit: Payer: Self-pay | Admitting: Internal Medicine

## 2023-08-25 ENCOUNTER — Other Ambulatory Visit: Payer: Self-pay | Admitting: Internal Medicine

## 2023-08-25 DIAGNOSIS — D692 Other nonthrombocytopenic purpura: Secondary | ICD-10-CM | POA: Diagnosis not present

## 2023-08-25 DIAGNOSIS — Z85820 Personal history of malignant melanoma of skin: Secondary | ICD-10-CM | POA: Diagnosis not present

## 2023-08-25 DIAGNOSIS — L821 Other seborrheic keratosis: Secondary | ICD-10-CM | POA: Diagnosis not present

## 2023-08-25 DIAGNOSIS — L570 Actinic keratosis: Secondary | ICD-10-CM | POA: Diagnosis not present

## 2023-08-25 DIAGNOSIS — Z85828 Personal history of other malignant neoplasm of skin: Secondary | ICD-10-CM | POA: Diagnosis not present

## 2023-08-25 DIAGNOSIS — L578 Other skin changes due to chronic exposure to nonionizing radiation: Secondary | ICD-10-CM | POA: Diagnosis not present

## 2023-08-25 DIAGNOSIS — D225 Melanocytic nevi of trunk: Secondary | ICD-10-CM | POA: Diagnosis not present

## 2023-08-25 DIAGNOSIS — L720 Epidermal cyst: Secondary | ICD-10-CM | POA: Diagnosis not present

## 2023-09-03 DIAGNOSIS — K219 Gastro-esophageal reflux disease without esophagitis: Secondary | ICD-10-CM | POA: Diagnosis not present

## 2023-09-03 DIAGNOSIS — E1142 Type 2 diabetes mellitus with diabetic polyneuropathy: Secondary | ICD-10-CM | POA: Diagnosis not present

## 2023-09-03 DIAGNOSIS — Z8249 Family history of ischemic heart disease and other diseases of the circulatory system: Secondary | ICD-10-CM | POA: Diagnosis not present

## 2023-09-03 DIAGNOSIS — E1143 Type 2 diabetes mellitus with diabetic autonomic (poly)neuropathy: Secondary | ICD-10-CM | POA: Diagnosis not present

## 2023-09-03 DIAGNOSIS — E1151 Type 2 diabetes mellitus with diabetic peripheral angiopathy without gangrene: Secondary | ICD-10-CM | POA: Diagnosis not present

## 2023-09-03 DIAGNOSIS — I2510 Atherosclerotic heart disease of native coronary artery without angina pectoris: Secondary | ICD-10-CM | POA: Diagnosis not present

## 2023-09-03 DIAGNOSIS — Z87891 Personal history of nicotine dependence: Secondary | ICD-10-CM | POA: Diagnosis not present

## 2023-09-03 DIAGNOSIS — K3184 Gastroparesis: Secondary | ICD-10-CM | POA: Diagnosis not present

## 2023-09-03 DIAGNOSIS — E785 Hyperlipidemia, unspecified: Secondary | ICD-10-CM | POA: Diagnosis not present

## 2023-09-03 DIAGNOSIS — M1990 Unspecified osteoarthritis, unspecified site: Secondary | ICD-10-CM | POA: Diagnosis not present

## 2023-09-03 DIAGNOSIS — I10 Essential (primary) hypertension: Secondary | ICD-10-CM | POA: Diagnosis not present

## 2023-09-03 DIAGNOSIS — Z008 Encounter for other general examination: Secondary | ICD-10-CM | POA: Diagnosis not present

## 2023-09-03 DIAGNOSIS — Z794 Long term (current) use of insulin: Secondary | ICD-10-CM | POA: Diagnosis not present

## 2023-09-10 ENCOUNTER — Other Ambulatory Visit (HOSPITAL_COMMUNITY): Payer: Self-pay

## 2023-09-14 ENCOUNTER — Ambulatory Visit: Payer: Medicare HMO | Admitting: Cardiovascular Disease

## 2023-10-28 ENCOUNTER — Ambulatory Visit: Payer: Medicare HMO | Admitting: Internal Medicine

## 2023-10-28 ENCOUNTER — Encounter: Payer: Self-pay | Admitting: Internal Medicine

## 2023-10-28 VITALS — BP 122/80 | HR 74 | Ht 68.0 in | Wt 161.0 lb

## 2023-10-28 DIAGNOSIS — Z794 Long term (current) use of insulin: Secondary | ICD-10-CM | POA: Diagnosis not present

## 2023-10-28 DIAGNOSIS — E1159 Type 2 diabetes mellitus with other circulatory complications: Secondary | ICD-10-CM | POA: Diagnosis not present

## 2023-10-28 DIAGNOSIS — E1142 Type 2 diabetes mellitus with diabetic polyneuropathy: Secondary | ICD-10-CM | POA: Diagnosis not present

## 2023-10-28 LAB — POCT GLUCOSE (DEVICE FOR HOME USE): POC Glucose: 238 mg/dL — AB (ref 70–99)

## 2023-10-28 LAB — POCT GLYCOSYLATED HEMOGLOBIN (HGB A1C): Hemoglobin A1C: 6.8 % — AB (ref 4.0–5.6)

## 2023-10-28 NOTE — Patient Instructions (Addendum)
-   A1c 6.8%  - Decrease  Basaglar 52 units  - Continue Humalog 25- 30 units with each meal  - Continue Farxiga 10 mg, 1 tablet daily   HOW TO TREAT LOW BLOOD SUGARS (Blood sugar LESS THAN 70 MG/DL) Please follow the RULE OF 15 for the treatment of hypoglycemia treatment (when your (blood sugars are less than 70 mg/dL)   STEP 1: Take 15 grams of carbohydrates when your blood sugar is low, which includes:  3-4 GLUCOSE TABS  OR 3-4 OZ OF JUICE OR REGULAR SODA OR ONE TUBE OF GLUCOSE GEL    STEP 2: RECHECK blood sugar in 15 MINUTES STEP 3: If your blood sugar is still low at the 15 minute recheck --> then, go back to STEP 1 and treat AGAIN with another 15 grams of carbohydrates.

## 2023-10-28 NOTE — Progress Notes (Signed)
Name: Joseph Dixon  Age/ Sex: 72 y.o., male   MRN/ DOB: [id number], 05-Oct-1951     PCP: Margaree Mackintosh, MD   Reason for Endocrinology Evaluation: Type 2 Diabetes Mellitus  Initial Endocrine Consultative Visit: 11/29/2018    PATIENT IDENTIFIER: [name] is a 72 y.o. male with a past medical history of DM, Aortic insufficiency , CAD and hyperlipidemia . The patient has followed with Endocrinology clinic since 11/29/2018 for consultative assistance with management of his diabetes.  DIABETIC HISTORY:  Joseph Dixon was diagnosed with T2DM ~ 2009, he does not recall any anti-glycemic agents he has been on, he has been on insulin for many years.  His hemoglobin A1c has ranged from 8.0% in 2019, peaking at 12.4% in 2016.   Pt with chronic intermittent Hx of diarrhea. That at times he attributes to Hx  ETOH intake.   Farxiga started 05/2021  SUBJECTIVE:   During the last visit (04/27/2023): A1c 7.4 %    Today (10/28/2023): Joseph Dixon is here for a 4 month follow up on his diabetes management.  He checks his blood sugars 3 times daily. 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 Patient presented to the ED with chest pain, abnormal stress test, s/p cardiac cath 05/2023 showing heavily calcified coronary arteries with severe one-vessel coronary artery disease involving codominant right coronary artery.,  Drug-eluting stent were placed  Patient follows with dermatology for  hx of malignant melanoma, resection 2024    Denies nausea, vomiting  Denies constipation but has chronic occasional diarrhea but none recently    HOME DIABETES REGIMEN:  Basaglar 55 units daily  Humalog 25- 30 units TIDQAC Farxiga 10 mg daily        GLUCOSE LOG: N/a   DIABETIC COMPLICATIONS: Microvascular complications:  Neuropathy , retinopathy Denies: retinopathy, nephropathy  Last eye  exam: Completed 09/25/2022   Macrovascular complications:  CAD, PVD, CVA     HISTORY:  Past Medical History:  Past Medical History:  Diagnosis Date   Cancer (HCC)    basal cell both shoulders   Diabetes mellitus    insulin dependant   Hyperlipidemia    Past Surgical History:  Past Surgical History:  Procedure Laterality Date   CORONARY LITHOTRIPSY N/A 05/26/2023   Procedure: CORONARY LITHOTRIPSY;  Surgeon: Iran Ouch, MD;  [facility]: [facility];  Service: Cardiovascular;  Laterality: N/A;   CORONARY STENT INTERVENTION N/A 05/26/2023   Procedure: CORONARY STENT INTERVENTION;  Surgeon: Iran Ouch, MD;  Location: [facility];  Service: Cardiovascular;  Laterality: N/A;   LEFT HEART CATH AND CORONARY ANGIOGRAPHY N/A 05/26/2023   Procedure: LEFT HEART CATH AND CORONARY ANGIOGRAPHY;  Surgeon: Iran Ouch, MD;  Location: [facility];  Service: Cardiovascular;  Laterality: N/A;   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   Proximal right great toe   repair of rt knee laceration     Social History:  reports that he has quit smoking. He has never used smokeless tobacco. He reports current alcohol use. He reports that he does not use drugs. Family History:  Family History  Problem Relation Age of Onset   Heart disease Father    Diabetes Father    Mental illness Father    Alzheimer's disease Father    COPD Brother    Diabetes Mother    Alzheimer's  disease Mother    Colon cancer Neg Hx      HOME MEDICATIONS: Allergies as of 10/28/2023       Reactions   Sulfa Antibiotics    hallucinations        Medication List        Accurate as of October 28, 2023 10:09 AM. If you have any questions, ask your nurse or doctor.          acetaminophen 500 MG tablet Commonly known as: TYLENOL Take 500 mg by mouth every 6 (six) hours as needed for moderate pain.   aspirin EC 81 MG tablet Take 1 tablet (81 mg total) by mouth daily.   Basaglar  KwikPen 100 UNIT/ML Inject 55 Units into the skin daily. What changed: when to take this   BD Pen Needle Nano 2nd Gen 32G X 4 MM Misc Generic drug: Insulin Pen Needle USE 1 PEN NEEDLE DAILY   blood glucose meter kit and supplies Kit Dispense based on patient and insurance preference. ICD-10: E11.9   clopidogrel 75 MG tablet Commonly known as: Plavix Take 1 tablet (75 mg total) by mouth daily.   Farxiga 10 MG Tabs tablet Generic drug: dapagliflozin propanediol TAKE 1 TABLET BY MOUTH DAILY BEFORE BREAKFAST.   fluorouracil 5 % cream Commonly known as: EFUDEX Apply 1 Application topically daily.   HumaLOG KwikPen 200 UNIT/ML KwikPen Generic drug: insulin lispro Max daily 90 units What changed:  how much to take how to take this when to take this additional instructions   hydrochlorothiazide 25 MG tablet Commonly known as: HYDRODIURIL TAKE 1 TABLET BY MOUTH EVERY DAY. NEED OFFICE VISIT   losartan 100 MG tablet Commonly known as: COZAAR TAKE 1 TABLET BY MOUTH EVERY DAY   nitroGLYCERIN 0.4 MG SL tablet Commonly known as: Nitrostat Place 1 tablet (0.4 mg total) under the tongue every 5 (five) minutes as needed for chest pain.   OneTouch Delica Lancets 33G Misc 1 Device by Does not apply route in the morning, at noon, in the evening, and at bedtime.   OneTouch Verio test strip Generic drug: glucose blood Check sugar 4 times daily   OneTouch Verio test strip Generic drug: glucose blood 1 each by Other route in the morning, at noon, in the evening, and at bedtime. Use as instructed   pantoprazole 40 MG tablet Commonly known as: PROTONIX TAKE 1 TABLET BY MOUTH EVERY DAY   rosuvastatin 40 MG tablet Commonly known as: CRESTOR Take 1 tablet (40 mg total) by mouth daily.        PHYSICAL EXAM: VS: BP 122/80 (BP Location: Left Arm, Patient Position: Sitting, Cuff Size: Small)   Pulse 74   Ht 5\' 8"  (1.727 m)   Wt 161 lb (73 kg)   SpO2 96%   BMI 24.48 kg/m     EXAM: General: Pt appears well and is in NAD  Lungs: Clear with good BS bilat   Heart: Auscultation: RRR   Extremities:  BL LE: no pretibial edema    Mental Status: Judgment, insight: intact Orientation: oriented to time, place, and person Mood and affect: no depression, anxiety, or agitation     DM Foot Exam  10/28/2023  The skin of the feet is  without sores or ulcerations ,right proximal amputations of  great toe .  Callus formation around the heels noted The pedal pulses are decreased bilaterally  The sensation is decreased to a screening 5.07, 10 gram monofilament bilaterally    DATA REVIEWED:  Lab Results  Component Value Date   HGBA1C 6.8 (A) 10/28/2023   HGBA1C 7.4 (A) 04/27/2023   HGBA1C 7.2 (H) 10/05/2022    Latest Reference Range & Units 05/18/23 11:36  Sodium 134 - 144 mmol/L 141  Potassium 3.5 - 5.2 mmol/L 5.0  Chloride 96 - 106 mmol/L 102  CO2 20 - 29 mmol/L 26  Glucose 70 - 99 mg/dL 55 (L)  BUN 8 - 27 mg/dL 18  Creatinine 1.61 - 0.96 mg/dL 0.45 (H)  Calcium 8.6 - 10.2 mg/dL 9.5  BUN/Creatinine Ratio 10 - 24  14  eGFR >59 mL/min/1.73 59 (L)    Old records , labs and images have been reviewed.    ASSESSMENT / PLAN / RECOMMENDATIONS:   1) Type 2 Diabetes Mellitus, Optimally controlled, With neuropathic , retinopathic and macrovascular complications - Most recent A1c of 6.8 %. Goal A1c < 7.0 %.    - A1c at goal -Patient has declined CGM technology in the past - Pt on assistance for insulin and Farxiga  -Has chronic history of GI issues, and he would like to avoid any medications that we will cause GI side effects.  Not a candidate for GLP-1 agonist nor metformin -Patient had hypoglycemic episode this morning, we will decrease Basaglar as below  MEDICATIONS:   Decrease Basaglar 52 units daily  Continue  Humalog 25- 30 units 3 times daily before every meal Continue Farxiga 10 mg, 1 tablet daily   EDUCATION / INSTRUCTIONS: BG monitoring  instructions: Patient is instructed to check his blood sugars 4 times a day, before meals and bedtime Call [facility] if: BG persistently < 70  I reviewed the Rule of 15 for the treatment of hypoglycemia in detail with the patient. Literature supplied.  2) Diabetic complications:  Eye: Does not have known diabetic retinopathy.  Neuro/ Feet: Does have known diabetic peripheral neuropathy. Renal: Patient does not have known baseline CKD. He is on an ACEI/ARB at present.  F/U 6 months  I spent 25 minutes preparing to see the patient by review of recent labs, imaging and procedures, obtaining and reviewing separately obtained history, communicating with the patient, ordering medications, tests or procedures, and documenting clinical information in the EHR including the differential Dx, treatment, and any further evaluation and other management   Signed electronically by: Lyndle Herrlich, MD  [facility]  [facility] [address] [place], [place] [address] Phone: [phone] FAX: [phone]   CC: Margaree Mackintosh, MD [address] [place] [address] Phone: [phone]  Fax: [phone]  Return to Endocrinology clinic as below: Future Appointments  Date Time Provider Department Center  10/28/2023 10:10 AM Collen Hostler, Konrad Dolores, MD [facility] None  11/08/2023  9:30 AM [facility]  11/22/2023 10:00 AM Margaree Mackintosh, MD [facility]  11/30/2023 10:20 AM Iran Ouch, MD [facility] None

## 2023-11-08 ENCOUNTER — Other Ambulatory Visit: Payer: Medicare HMO

## 2023-11-08 DIAGNOSIS — E1165 Type 2 diabetes mellitus with hyperglycemia: Secondary | ICD-10-CM

## 2023-11-08 DIAGNOSIS — E1169 Type 2 diabetes mellitus with other specified complication: Secondary | ICD-10-CM

## 2023-11-08 DIAGNOSIS — I10 Essential (primary) hypertension: Secondary | ICD-10-CM

## 2023-11-08 DIAGNOSIS — Z0000 Encounter for general adult medical examination without abnormal findings: Secondary | ICD-10-CM

## 2023-11-09 DIAGNOSIS — H35033 Hypertensive retinopathy, bilateral: Secondary | ICD-10-CM | POA: Diagnosis not present

## 2023-11-09 DIAGNOSIS — H40013 Open angle with borderline findings, low risk, bilateral: Secondary | ICD-10-CM | POA: Diagnosis not present

## 2023-11-09 DIAGNOSIS — H53121 Transient visual loss, right eye: Secondary | ICD-10-CM | POA: Diagnosis not present

## 2023-11-09 DIAGNOSIS — H25813 Combined forms of age-related cataract, bilateral: Secondary | ICD-10-CM | POA: Diagnosis not present

## 2023-11-09 DIAGNOSIS — E119 Type 2 diabetes mellitus without complications: Secondary | ICD-10-CM | POA: Diagnosis not present

## 2023-11-09 DIAGNOSIS — H35433 Paving stone degeneration of retina, bilateral: Secondary | ICD-10-CM | POA: Diagnosis not present

## 2023-11-09 LAB — CBC WITH DIFFERENTIAL/PLATELET
Absolute Lymphocytes: 1758 {cells}/uL (ref 850–3900)
Absolute Monocytes: 526 {cells}/uL (ref 200–950)
Basophils Absolute: 101 {cells}/uL (ref 0–200)
Basophils Relative: 1.8 %
Eosinophils Absolute: 459 {cells}/uL (ref 15–500)
Eosinophils Relative: 8.2 %
HCT: 50.6 % — ABNORMAL HIGH (ref 38.5–50.0)
Hemoglobin: 16.4 g/dL (ref 13.2–17.1)
MCH: 29.5 pg (ref 27.0–33.0)
MCHC: 32.4 g/dL (ref 32.0–36.0)
MCV: 91 fL (ref 80.0–100.0)
MPV: 12.2 fL (ref 7.5–12.5)
Monocytes Relative: 9.4 %
Neutro Abs: 2755 {cells}/uL (ref 1500–7800)
Neutrophils Relative %: 49.2 %
Platelets: 198 10*3/uL (ref 140–400)
RBC: 5.56 10*6/uL (ref 4.20–5.80)
RDW: 13.5 % (ref 11.0–15.0)
Total Lymphocyte: 31.4 %
WBC: 5.6 10*3/uL (ref 3.8–10.8)

## 2023-11-09 LAB — HEMOGLOBIN A1C
Hgb A1c MFr Bld: 7.2 %{Hb} — ABNORMAL HIGH (ref <5.7)
Mean Plasma Glucose: 160 mg/dL
eAG (mmol/L): 8.9 mmol/L

## 2023-11-09 LAB — COMPLETE METABOLIC PANEL WITH GFR
AG Ratio: 1.7 (calc) (ref 1.0–2.5)
ALT: 19 U/L (ref 9–46)
AST: 18 U/L (ref 10–35)
Albumin: 4.5 g/dL (ref 3.6–5.1)
Alkaline phosphatase (APISO): 56 U/L (ref 35–144)
BUN: 17 mg/dL (ref 7–25)
CO2: 31 mmol/L (ref 20–32)
Calcium: 9.9 mg/dL (ref 8.6–10.3)
Chloride: 103 mmol/L (ref 98–110)
Creat: 0.96 mg/dL (ref 0.70–1.28)
Globulin: 2.6 g/dL (ref 1.9–3.7)
Glucose, Bld: 158 mg/dL — ABNORMAL HIGH (ref 65–99)
Potassium: 4.4 mmol/L (ref 3.5–5.3)
Sodium: 142 mmol/L (ref 135–146)
Total Bilirubin: 0.5 mg/dL (ref 0.2–1.2)
Total Protein: 7.1 g/dL (ref 6.1–8.1)
eGFR: 85 mL/min/{1.73_m2} (ref >=60)

## 2023-11-09 LAB — MICROALBUMIN / CREATININE URINE RATIO
Creatinine, Urine: 83 mg/dL (ref 20–320)
Microalb Creat Ratio: 18 mg/g{creat} (ref <30)
Microalb, Ur: 1.5 mg/dL

## 2023-11-09 LAB — LIPID PANEL
Cholesterol: 143 mg/dL (ref <200)
HDL: 67 mg/dL (ref >=40)
LDL Cholesterol (Calc): 54 mg/dL
Non-HDL Cholesterol (Calc): 76 mg/dL (ref <130)
Total CHOL/HDL Ratio: 2.1 (calc) (ref <5.0)
Triglycerides: 132 mg/dL (ref <150)

## 2023-11-09 LAB — PSA: PSA: 0.38 ng/mL (ref <=4.00)

## 2023-11-09 LAB — HM DIABETES EYE EXAM
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
## 2023-11-22 ENCOUNTER — Encounter: Payer: Self-pay | Admitting: Internal Medicine

## 2023-11-22 ENCOUNTER — Ambulatory Visit: Payer: Medicare HMO | Admitting: Internal Medicine

## 2023-11-22 ENCOUNTER — Telehealth: Payer: Self-pay

## 2023-11-22 VITALS — BP 120/80 | HR 72 | Ht 68.0 in | Wt 157.0 lb

## 2023-11-22 DIAGNOSIS — S98111A Complete traumatic amputation of right great toe, initial encounter: Secondary | ICD-10-CM

## 2023-11-22 DIAGNOSIS — E1143 Type 2 diabetes mellitus with diabetic autonomic (poly)neuropathy: Secondary | ICD-10-CM | POA: Diagnosis not present

## 2023-11-22 DIAGNOSIS — E1169 Type 2 diabetes mellitus with other specified complication: Secondary | ICD-10-CM

## 2023-11-22 DIAGNOSIS — Z8679 Personal history of other diseases of the circulatory system: Secondary | ICD-10-CM

## 2023-11-22 DIAGNOSIS — R195 Other fecal abnormalities: Secondary | ICD-10-CM

## 2023-11-22 DIAGNOSIS — I351 Nonrheumatic aortic (valve) insufficiency: Secondary | ICD-10-CM

## 2023-11-22 DIAGNOSIS — K219 Gastro-esophageal reflux disease without esophagitis: Secondary | ICD-10-CM

## 2023-11-22 DIAGNOSIS — E785 Hyperlipidemia, unspecified: Secondary | ICD-10-CM | POA: Diagnosis not present

## 2023-11-22 DIAGNOSIS — I739 Peripheral vascular disease, unspecified: Secondary | ICD-10-CM

## 2023-11-22 DIAGNOSIS — Z0000 Encounter for general adult medical examination without abnormal findings: Secondary | ICD-10-CM

## 2023-11-22 DIAGNOSIS — I350 Nonrheumatic aortic (valve) stenosis: Secondary | ICD-10-CM | POA: Diagnosis not present

## 2023-11-22 DIAGNOSIS — Z23 Encounter for immunization: Secondary | ICD-10-CM

## 2023-11-22 DIAGNOSIS — E1165 Type 2 diabetes mellitus with hyperglycemia: Secondary | ICD-10-CM

## 2023-11-22 DIAGNOSIS — Z794 Long term (current) use of insulin: Secondary | ICD-10-CM

## 2023-11-22 DIAGNOSIS — I10 Essential (primary) hypertension: Secondary | ICD-10-CM

## 2023-11-22 DIAGNOSIS — Z1211 Encounter for screening for malignant neoplasm of colon: Secondary | ICD-10-CM

## 2023-11-22 LAB — IFOBT (OCCULT BLOOD): IFOBT: POSITIVE

## 2023-11-22 NOTE — Patient Instructions (Signed)
It was a pleasure to see you today.  Continue current medications and continue follow-up with Endocrinologist.  Labs are stable.  Flu vaccine given.  Return here in 1 year or as needed.  Vaccines discussed.  Stool is guaiac positive on exam today.  You are due colonoscopy and we will make referral to [facility] where you have been previously.

## 2023-11-22 NOTE — Telephone Encounter (Signed)
Paperwork refaxed to [employer]

## 2023-11-24 ENCOUNTER — Encounter: Payer: Self-pay | Admitting: Internal Medicine

## 2023-11-25 NOTE — Progress Notes (Signed)
Referral placed.

## 2023-11-30 ENCOUNTER — Ambulatory Visit: Payer: Medicare HMO | Attending: Cardiovascular Disease | Admitting: Cardiovascular Disease

## 2023-11-30 ENCOUNTER — Encounter: Payer: Self-pay | Admitting: Cardiovascular Disease

## 2023-11-30 VITALS — BP 138/50 | HR 75 | Ht 69.0 in | Wt 163.2 lb

## 2023-11-30 DIAGNOSIS — I739 Peripheral vascular disease, unspecified: Secondary | ICD-10-CM

## 2023-11-30 DIAGNOSIS — E785 Hyperlipidemia, unspecified: Secondary | ICD-10-CM | POA: Diagnosis not present

## 2023-11-30 DIAGNOSIS — I2510 Atherosclerotic heart disease of native coronary artery without angina pectoris: Secondary | ICD-10-CM | POA: Diagnosis not present

## 2023-11-30 DIAGNOSIS — I350 Nonrheumatic aortic (valve) stenosis: Secondary | ICD-10-CM

## 2023-11-30 DIAGNOSIS — I10 Essential (primary) hypertension: Secondary | ICD-10-CM | POA: Diagnosis not present

## 2023-11-30 NOTE — Progress Notes (Signed)
Cardiology Office Note   Date:  11/30/2023   ID:  Joseph Dixon, DOB 1951-07-19, MRN [id number]  PCP:  Margaree Mackintosh, MD  Cardiologist:   Lorine Bears, MD   No chief complaint on file.      History of Present Illness: Joseph Dixon is a 72 y.o. male who is here today  for a follow-up visit regarding peripheral arterial disease and coronary artery disease.  The patient has known history of diabetes mellitus, hypertension and hyperlipidemia.   He is not a smoker and has no family history of premature coronary artery disease. He was seen in 2019 for a slow healing small ulceration on the left big toe. He underwent noninvasive vascular evaluation which showed near normal ABI.  Duplex showed mainly tibial disease with an occluded anterior tibial artery on the right and an occluded anterior tibial and peroneal arteries on the left.  The ulceration healed without need for revascularization. He had right big toe osteomyelitis  and underwent partial right big toe amputation with subsequent healing.    He was suspected of TIA in September of 2023 with sudden loss of vision in the right eye which lasted for few minutes and was followed by visual disturbances and blurry vision. He was noted to have irregular heartbeat and there has been a concern about possible paroxysmal atrial fibrillation.  He had an echocardiogram done in November which showed normal LV systolic function with mild aortic stenosis. A 2 weeks ZIO monitor was performed which showed no evidence of atrial fibrillation.  However, he was noted to have short runs of SVT and frequent episodes of nonsustained ventricular tachycardia.  Due to that, he underwent a Lexiscan Myoview which showed evidence of prior inferior infarct without significant ischemia.  EF was mildly reduced. 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  Cardiac catheterization was done in June of  this year which showed heavily calcified coronary arteries with severe one-vessel coronary artery disease involving a codominant right coronary artery.  Ejection fraction was normal with mildly elevated left ventricular end-diastolic pressure.  I performed successful intravascular lithotripsy and drug-eluting stent placement to the right coronary artery.  He has been doing well with no chest pain, shortness of breath or palpitations.  He denies lower extremity claudication. He was found to have occult blood in stool and might need colonoscopy in the near future.   Past Medical History:  Diagnosis Date   Cancer (HCC)    basal cell both shoulders   Diabetes mellitus    insulin dependant   Hyperlipidemia     Past Surgical History:  Procedure Laterality Date   CORONARY LITHOTRIPSY N/A 05/26/2023   Procedure: CORONARY LITHOTRIPSY;  Surgeon: Iran Ouch, MD;  Location: [facility] INVASIVE CV LAB;  Service: Cardiovascular;  Laterality: N/A;   CORONARY STENT INTERVENTION N/A 05/26/2023   Procedure: CORONARY STENT INTERVENTION;  Surgeon: Iran Ouch, MD;  Location: [facility] INVAS[facility] CV LAB;  Service: Cardiovascular;  Laterality: N/A;   LEFT HEART CATH AND CORONARY ANGIOGRAPHY N/A 05/26/2023   Procedure: LEFT HEART CATH AND CORONARY ANGIOGRAPHY;  Surgeon: Iran Ouch, MD;  Location: [facility] INVASIVE CV LAB;  Service: Cardiovascular;  Laterality: N/A;   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   Proximal right great toe   repair of rt knee laceration       Current Outpatient Medications  Medication Sig Dispense Refill   acetaminophen (TYLENOL) 500 MG tablet  Take 500 mg by mouth every 6 (six) hours as needed for moderate pain.     aspirin EC 81 MG tablet Take 1 tablet (81 mg total) by mouth daily. 90 tablet 3   blood glucose meter kit and supplies KIT Dispense based on patient and insurance preference. ICD-10: E11.9 1 each 0   clopidogrel (PLAVIX) 75 MG tablet Take 1 tablet (75 mg total) by mouth  daily. 30 tablet 12   FARXIGA 10 MG TABS tablet TAKE 1 TABLET BY MOUTH DAILY BEFORE BREAKFAST. 30 tablet 6   fluorouracil (EFUDEX) 5 % cream Apply 1 Application topically daily.     glucose blood (ONETOUCH VERIO) test strip Check sugar 4 times daily 400 strip 3   glucose blood (ONETOUCH VERIO) test strip 1 each by Other route in the morning, at noon, in the evening, and at bedtime. Use as instructed 400 each 3   hydrochlorothiazide (HYDRODIURIL) 25 MG tablet TAKE 1 TABLET BY MOUTH EVERY DAY. NEED OFFICE VISIT 90 tablet 1   Insulin Glargine (BASAGLAR KWIKPEN) 100 UNIT/ML Inject 55 Units into the skin daily. (Patient taking differently: Inject 55 Units into the skin at bedtime.) 60 mL 3   insulin lispro (HUMALOG KWIKPEN) 200 UNIT/ML KwikPen Max daily 90 units (Patient taking differently: Inject 25-30 Units into the skin with breakfast, with lunch, and with evening meal.) 45 mL 3   Insulin Pen Needle (BD PEN NEEDLE NANO 2ND GEN) 32G X 4 MM MISC USE 1 PEN NEEDLE DAILY 100 each 11   losartan (COZAAR) 100 MG tablet TAKE 1 TABLET BY MOUTH EVERY DAY 90 tablet 1   nitroGLYCERIN (NITROSTAT) 0.4 MG SL tablet Place 1 tablet (0.4 mg total) under the tongue every 5 (five) minutes as needed for chest pain. 25 tablet 2   OneTouch Delica Lancets 33G MISC 1 Device by Does not apply route in the morning, at noon, in the evening, and at bedtime. 400 each 3   pantoprazole (PROTONIX) 40 MG tablet TAKE 1 TABLET BY MOUTH EVERY DAY 90 tablet 3   rosuvastatin (CRESTOR) 40 MG tablet Take 1 tablet (40 mg total) by mouth daily. 90 tablet 3   No current facility-administered medications for this visit.    Allergies:   Sulfa antibiotics    Social History:  The patient  reports that he has quit smoking. He has never used smokeless tobacco. He reports current alcohol use. He reports that he does not use drugs.   Family History:  The patient's family history includes Alzheimer's disease in his father and mother; COPD in his  brother; Diabetes in his father and mother; Heart disease in his father; Mental illness in his father.    ROS:  Please see the history of present illness.   Otherwise, review of systems are positive for none.   All other systems are reviewed and negative.    PHYSICAL EXAM: VS:  BP (!) 138/50 (BP Location: Left Arm, Patient Position: Sitting, Cuff Size: Normal)   Pulse 75   Ht 5\' 9"  (1.753 m)   Wt 163 lb 3.2 oz (74 kg)   SpO2 93%   BMI 24.10 kg/m  , BMI Body mass index is 24.1 kg/m. GEN: Well nourished, well developed, in no acute distress  HEENT: normal  Neck: no JVD,  or masses.  Bilateral carotid bruits louder on the left side Cardiac: RRR; no rubs, or gallops,no edema .  2 out of 6 systolic murmur in the aortic area which is early peaking. Respiratory:  clear to auscultation bilaterally, normal work of breathing GI: soft, nontender, nondistended, + BS MS: no deformity or atrophy  Skin: warm and dry, no rash Neuro:  Strength and sensation are intact Psych: euthymic mood, full affect Right radial pulse is normal with no hematoma.   EKG:  EKG is  ordered today.  EKG showed : Sinus rhythm with Premature atrial complexes Inferior infarct (cited on or before 26-May-2023) When compared with ECG of 15-Jun-2023 08:15, Premature atrial complexes are now Present Nonspecific T wave abnormality now evident in Inferior leads    Recent Labs: 11/08/2023: ALT 19; BUN 17; Creat 0.96; Hemoglobin 16.4; Platelets 198; Potassium 4.4; Sodium 142    Lipid Panel    Component Value Date/Time   CHOL 143 11/08/2023 0949   TRIG 132 11/08/2023 0949   HDL 67 11/08/2023 0949   CHOLHDL 2.1 11/08/2023 0949   VLDL 49 (H) 03/25/2017 1056   LDLCALC 54 11/08/2023 0949      Wt Readings from Last 3 Encounters:  11/30/23 163 lb 3.2 oz (74 kg)  11/22/23 157 lb (71.2 kg)  10/28/23 161 lb (73 kg)           No data to display            ASSESSMENT AND PLAN:  1.  Coronary artery disease  involving native coronary arteries without angina: Status post recent PCI and drug-eluting stent placement to the right coronary artery.  His coronary arteries were noted to be overall heavily calcified.  No other obstructive disease involving the LAD and left circumflex.   Dual antiplatelet therapy for another 6 months.  2.  Peripheral arterial disease: The patient has known tibial disease but currently has no claudication or lower extremity ulceration.  Continue medical therapy  3.  amaurosis fugax: Likely due to small posterior inferior cerebellar artery infarct as confirmed by MRI.  No significant carotid disease.  4.  Aortic stenosis: This was mild on most recent echocardiogram and can be followed with surveillance every 2 to 3 years.  5.  Essential hypertension: Blood pressure is controlled.  6.  Hyperlipidemia: Lipid profile improved after increasing rosuvastatin with most recent LDL of 54.  7.  Nonsustained ventricular tachycardia: Completely asymptomatic.  Likely due to prior inferior scar.  His resting heart rate is on the low side and thus I did not add a beta-blocker.  8. Possible need for colonoscopy: Low risk from a cardiac standpoint and Plavix can be held 5 days before.    Disposition:   Follow-up in 6 months.  Signed,  Lorine Bears, MD  11/30/2023 10:27 AM    [facility]

## 2023-11-30 NOTE — Patient Instructions (Signed)
Medication Instructions:  No changes *If you need a refill on your cardiac medications before your next appointment, please call your pharmacy*   Lab Work: None ordered If you have labs (blood work) drawn today and your tests are completely normal, you will receive your results only by: [place] (if you have MyChart) OR A paper copy in the mail If you have any lab test that is abnormal or we need to change your treatment, we will call you to review the results.   Testing/Procedures: None ordered   Follow-Up: At [facility], you and your health needs are our priority.  As part of our continuing mission to provide you with exceptional heart care, we have created designated Provider Care Teams.  These Care Teams include your primary Cardiologist (physician) and Advanced Practice Providers (APPs -  Physician Assistants and Nurse Practitioners) who all work together to provide you with the care you need, when you need it.  We recommend signing up for the patient portal called "MyChart".  Sign up information is provided on this After Visit Summary.  MyChart is used to connect with patients for Virtual Visits (Telemedicine).  Patients are able to view lab/test results, encounter notes, upcoming appointments, etc.  Non-urgent messages can be sent to your provider as well.   To learn more about what you can do with MyChart, go to [email or web address].    Your next appointment:   6 month(s)  Provider:   Kathlyn Sacramento, MD

## 2023-12-01 ENCOUNTER — Telehealth: Payer: Self-pay | Admitting: Internal Medicine

## 2023-12-01 NOTE — Telephone Encounter (Signed)
Application was placed up front in folder

## 2023-12-01 NOTE — Telephone Encounter (Signed)
Patient is calling saying that he needs an application for [employer] for [employer] KwikPen and [employer] .  Patient would like to be called when he can get these.

## 2023-12-01 NOTE — Telephone Encounter (Signed)
Patient dropped off patient assistance paperwork.  Was placed in the providers in box at the front desk.  Company is - [employer] - income and insurance card attached to application

## 2023-12-12 ENCOUNTER — Other Ambulatory Visit: Payer: Self-pay | Admitting: Internal Medicine

## 2024-01-04 DIAGNOSIS — L578 Other skin changes due to chronic exposure to nonionizing radiation: Secondary | ICD-10-CM | POA: Diagnosis not present

## 2024-01-04 DIAGNOSIS — L570 Actinic keratosis: Secondary | ICD-10-CM | POA: Diagnosis not present

## 2024-01-04 DIAGNOSIS — Z85820 Personal history of malignant melanoma of skin: Secondary | ICD-10-CM | POA: Diagnosis not present

## 2024-01-04 DIAGNOSIS — D225 Melanocytic nevi of trunk: Secondary | ICD-10-CM | POA: Diagnosis not present

## 2024-01-04 DIAGNOSIS — D1801 Hemangioma of skin and subcutaneous tissue: Secondary | ICD-10-CM | POA: Diagnosis not present

## 2024-01-04 DIAGNOSIS — L821 Other seborrheic keratosis: Secondary | ICD-10-CM | POA: Diagnosis not present

## 2024-01-04 DIAGNOSIS — C44319 Basal cell carcinoma of skin of other parts of face: Secondary | ICD-10-CM | POA: Diagnosis not present

## 2024-01-04 DIAGNOSIS — Z85828 Personal history of other malignant neoplasm of skin: Secondary | ICD-10-CM | POA: Diagnosis not present

## 2024-01-26 ENCOUNTER — Other Ambulatory Visit: Payer: Self-pay | Admitting: Internal Medicine

## 2024-02-09 DIAGNOSIS — C44319 Basal cell carcinoma of skin of other parts of face: Secondary | ICD-10-CM | POA: Diagnosis not present

## 2024-02-09 DIAGNOSIS — Z85820 Personal history of malignant melanoma of skin: Secondary | ICD-10-CM | POA: Diagnosis not present

## 2024-02-21 DIAGNOSIS — E1142 Type 2 diabetes mellitus with diabetic polyneuropathy: Secondary | ICD-10-CM | POA: Diagnosis not present

## 2024-02-21 DIAGNOSIS — Z008 Encounter for other general examination: Secondary | ICD-10-CM | POA: Diagnosis not present

## 2024-02-21 DIAGNOSIS — E1143 Type 2 diabetes mellitus with diabetic autonomic (poly)neuropathy: Secondary | ICD-10-CM | POA: Diagnosis not present

## 2024-02-21 DIAGNOSIS — Z794 Long term (current) use of insulin: Secondary | ICD-10-CM | POA: Diagnosis not present

## 2024-02-21 DIAGNOSIS — I25119 Atherosclerotic heart disease of native coronary artery with unspecified angina pectoris: Secondary | ICD-10-CM | POA: Diagnosis not present

## 2024-02-21 DIAGNOSIS — Z833 Family history of diabetes mellitus: Secondary | ICD-10-CM | POA: Diagnosis not present

## 2024-02-21 DIAGNOSIS — E1136 Type 2 diabetes mellitus with diabetic cataract: Secondary | ICD-10-CM | POA: Diagnosis not present

## 2024-02-21 DIAGNOSIS — I10 Essential (primary) hypertension: Secondary | ICD-10-CM | POA: Diagnosis not present

## 2024-02-21 DIAGNOSIS — E1151 Type 2 diabetes mellitus with diabetic peripheral angiopathy without gangrene: Secondary | ICD-10-CM | POA: Diagnosis not present

## 2024-02-21 DIAGNOSIS — E1165 Type 2 diabetes mellitus with hyperglycemia: Secondary | ICD-10-CM | POA: Diagnosis not present

## 2024-02-21 DIAGNOSIS — Z7982 Long term (current) use of aspirin: Secondary | ICD-10-CM | POA: Diagnosis not present

## 2024-02-21 DIAGNOSIS — E1159 Type 2 diabetes mellitus with other circulatory complications: Secondary | ICD-10-CM | POA: Diagnosis not present

## 2024-02-21 DIAGNOSIS — Z8249 Family history of ischemic heart disease and other diseases of the circulatory system: Secondary | ICD-10-CM | POA: Diagnosis not present

## 2024-02-21 LAB — HM DIABETES EYE EXAM

## 2024-02-21 LAB — HEMOGLOBIN A1C: A1c: 7

## 2024-03-02 ENCOUNTER — Telehealth: Payer: Self-pay

## 2024-03-02 NOTE — Telephone Encounter (Signed)
 Unable to reach anyone to discuss.

## 2024-03-02 NOTE — Telephone Encounter (Signed)
 Reason for CRM: Crystal with [facility], [phone], was calling to report an abnormal A1C of 7.0 for the patient from 3/3 during a home visit. I attempted to get her transferred to CAL but she disconnected the line while on hold. I am sending a CRM at the request of CAL.    FYI.

## 2024-03-06 NOTE — Telephone Encounter (Signed)
 Patient follows up with endo for A1C

## 2024-03-06 NOTE — Telephone Encounter (Signed)
 Copied from CRM [id number]. Topic: Clinical - Lab/Test Results >> Mar 03, 2024  9:23 AM Nyra Capes wrote: Reason for CRM: Andrey Campanile from [facility] with Monia Pouch calling in wanted to verify patient is still seeing Dr Lenord Fellers,  patient had a physical on 02/21/24 and labs were drawn, results are Abnormal for A1C - 7 Sandy stated the results will be mailed into the office and should arrive in 7 to 14 days.

## 2024-04-26 ENCOUNTER — Ambulatory Visit: Payer: Medicare HMO | Admitting: Internal Medicine

## 2024-04-26 ENCOUNTER — Encounter: Payer: Self-pay | Admitting: Internal Medicine

## 2024-04-26 VITALS — BP 118/70 | HR 66 | Ht 69.0 in | Wt 163.2 lb

## 2024-04-26 DIAGNOSIS — Z794 Long term (current) use of insulin: Secondary | ICD-10-CM | POA: Diagnosis not present

## 2024-04-26 DIAGNOSIS — E1142 Type 2 diabetes mellitus with diabetic polyneuropathy: Secondary | ICD-10-CM | POA: Diagnosis not present

## 2024-04-26 LAB — POCT GLYCOSYLATED HEMOGLOBIN (HGB A1C): Hemoglobin A1C: 7.1 % — AB (ref 4.0–5.6)

## 2024-04-26 NOTE — Progress Notes (Signed)
 Name: Joseph Dixon  Age/ Sex: 73 y.o., male   MRN/ DOB: [id number], 14-Sep-1951     PCP: Sylvan Evener, MD   Reason for Endocrinology Evaluation: Type 2 Diabetes Mellitus  Initial Endocrine Consultative Visit: 11/29/2018    PATIENT IDENTIFIER: Joseph Dixon is a 73 y.o. male with a past medical history of DM, Aortic insufficiency , CAD and hyperlipidemia . The patient has followed with Endocrinology clinic since 11/29/2018 for consultative assistance with management of his diabetes.  DIABETIC HISTORY:  Joseph Dixon was diagnosed with T2DM ~ 2009, he does not recall any anti-glycemic agents he has been on, he has been on insulin  for many years.  His hemoglobin A1c has ranged from 8.0% in 2019, peaking at 12.4% in 2016.   Pt with chronic intermittent Hx of diarrhea. That at times he attributes to Hx  ETOH intake.   Farxiga  started 05/2021  SUBJECTIVE:   During the last visit (10/28/2023): A1c 6.8 %    Today (04/26/2024): Joseph Dixon is here for a 4 month follow up on his diabetes management.  He checks his blood sugars 3 times daily. 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  Patient follows with dermatology for  hx of malignant melanoma, resection 2024    Denies nausea, vomiting  Denies constipation but has chronic occasional diarrhea but none recently    HOME DIABETES REGIMEN:  Basaglar  55 units daily  Humalog  25-30 units TIDQAC Farxiga  10 mg daily     GLUCOSE LOG:    DIABETIC COMPLICATIONS: Microvascular complications:  Neuropathy , retinopathy Denies: retinopathy, nephropathy  Last eye exam: Completed 02/21/2024   Macrovascular complications:  CAD, PVD, CVA     HISTORY:  Past Medical History:  Past Medical History:  Diagnosis Date   Cancer (HCC)    basal cell both shoulders   Diabetes mellitus    insulin  dependant   Hyperlipidemia     Past Surgical History:  Past Surgical History:  Procedure Laterality Date   CORONARY LITHOTRIPSY N/A 05/26/2023   Procedure: CORONARY LITHOTRIPSY;  Surgeon: Wenona Hamilton, MD;  [facility];  Service: Cardiovascular;  Laterality: N/A;   CORONARY STENT INTERVENTION N/A 05/26/2023   Procedure: CORONARY STENT INTERVENTION;  Surgeon: Wenona Hamilton, MD;  Location: [facility];  Service: Cardiovascular;  Laterality: N/A;   LEFT HEART CATH AND CORONARY ANGIOGRAPHY N/A 05/26/2023   Procedure: LEFT HEART CATH AND CORONARY ANGIOGRAPHY;  Surgeon: Wenona Hamilton, MD;  Location: [facility];  Service: Cardiovascular;  Laterality: N/A;   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   Proximal right great toe   repair of rt knee laceration     Social History:  reports that he has quit smoking. He has never used smokeless tobacco. He reports current alcohol use. He reports that he does not use drugs. Family History:  Family History  Problem Relation Age of Onset   Heart disease Father    Diabetes Father    Mental illness Father    Alzheimer's disease Father    COPD Brother    Diabetes Mother    Alzheimer's disease Mother    Colon cancer Neg Hx      HOME MEDICATIONS: Allergies as of 04/26/2024       Reactions   Sulfa Antibiotics    hallucinations  Medication List        Accurate as of Apr 26, 2024 10:34 AM. If you have any questions, ask your nurse or doctor.          acetaminophen  500 MG tablet Commonly known as: TYLENOL  Take 500 mg by mouth every 6 (six) hours as needed for moderate pain.   aspirin  EC 81 MG tablet Take 1 tablet (81 mg total) by mouth daily.   Basaglar  KwikPen 100 UNIT/ML Inject 55 Units into the skin daily. What changed: when to take this   BD Pen Needle Nano 2nd Gen 32G X 4 MM Misc Generic drug: Insulin  Pen Needle USE 1 PEN NEEDLE DAILY   blood glucose meter kit and supplies Kit Dispense based on patient and  insurance preference. ICD-10: E11.9   clopidogrel  75 MG tablet Commonly known as: Plavix  Take 1 tablet (75 mg total) by mouth daily.   Farxiga  10 MG Tabs tablet Generic drug: dapagliflozin  propanediol TAKE 1 TABLET BY MOUTH DAILY BEFORE BREAKFAST.   fluorouracil  5 % cream Commonly known as: EFUDEX  Apply 1 Application topically daily.   HumaLOG  KwikPen 200 UNIT/ML KwikPen Generic drug: insulin  lispro Max daily 90 units What changed:  how much to take how to take this when to take this additional instructions   hydrochlorothiazide  25 MG tablet Commonly known as: HYDRODIURIL  TAKE 1 TABLET BY MOUTH EVERY DAY. NEED OFFICE VISIT   losartan  100 MG tablet Commonly known as: COZAAR  TAKE 1 TABLET BY MOUTH EVERY DAY   nitroGLYCERIN  0.4 MG SL tablet Commonly known as: Nitrostat  Place 1 tablet (0.4 mg total) under the tongue every 5 (five) minutes as needed for chest pain.   OneTouch Delica Lancets 33G Misc 1 Device by Does not apply route in the morning, at noon, in the evening, and at bedtime.   OneTouch Verio test strip Generic drug: glucose blood Check sugar 4 times daily   OneTouch Verio test strip Generic drug: glucose blood 1 EACH BY OTHER ROUTE IN THE MORNING, AT NOON, IN THE EVENING, AND AT BEDTIME. USE AS INSTRUCTED   pantoprazole  40 MG tablet Commonly known as: PROTONIX  TAKE 1 TABLET BY MOUTH EVERY DAY   rosuvastatin  40 MG tablet Commonly known as: CRESTOR  Take 1 tablet (40 mg total) by mouth daily.        PHYSICAL EXAM: VS: BP 118/70 (BP Location: Left Arm, Patient Position: Sitting, Cuff Size: Normal)   Pulse 66   Ht 5\' 9"  (1.753 m)   Wt 163 lb 3.2 oz (74 kg)   SpO2 97%   BMI 24.10 kg/m    EXAM: General: Pt appears well and is in NAD  Lungs: Clear with good BS bilat   Heart: Auscultation: RRR   Extremities:  BL LE: no pretibial edema    Mental Status: Judgment, insight: intact Orientation: oriented to time, place, and person Mood and affect:  no depression, anxiety, or agitation     DM Foot Exam  10/28/2023  The skin of the feet is  without sores or ulcerations ,right proximal amputations of  great toe .  Callus formation around the heels noted The pedal pulses are decreased bilaterally  The sensation is decreased to a screening 5.07, 10 gram monofilament bilaterally    DATA REVIEWED:  Lab Results  Component Value Date   HGBA1C 7.2 (H) 11/08/2023   HGBA1C 6.8 (A) 10/28/2023   HGBA1C 7.4 (A) 04/27/2023    Latest Reference Range & Units 11/08/23 09:49  Sodium 135 - 146 mmol/L  142  Potassium 3.5 - 5.3 mmol/L 4.4  Chloride 98 - 110 mmol/L 103  CO2 20 - 32 mmol/L 31  Glucose 65 - 99 mg/dL 161 (H)  Mean Plasma Glucose mg/dL 096  BUN 7 - 25 mg/dL 17  Creatinine 0.45 - 4.09 mg/dL 8.11  Calcium  8.6 - 10.3 mg/dL 9.9  BUN/Creatinine Ratio 6 - 22 (calc) SEE NOTE:  eGFR > OR = 60 mL/min/1.59m2 85  AG Ratio 1.0 - 2.5 (calc) 1.7  AST 10 - 35 U/L 18  ALT 9 - 46 U/L 19  Total Protein 6.1 - 8.1 g/dL 7.1  Total Bilirubin 0.2 - 1.2 mg/dL 0.5  Total CHOL/HDL Ratio <5.0 (calc) 2.1  Cholesterol <200 mg/dL 914  HDL Cholesterol > OR = 40 mg/dL 67  LDL Cholesterol (Calc) mg/dL (calc) 54  MICROALB/CREAT RATIO <30 mg/g creat 18  Non-HDL Cholesterol (Calc) <130 mg/dL (calc) 76  Triglycerides <150 mg/dL 782    Old records , labs and images have been reviewed.    ASSESSMENT / PLAN / RECOMMENDATIONS:   1) Type 2 Diabetes Mellitus, Optimally controlled, With neuropathic , retinopathic and macrovascular complications - Most recent A1c of 7.1 %. Goal A1c < 7.0 %.    - A1c slightly above goal -Patient can continues to decline CGM technology i - Pt on assistance for insulin  and Farxiga   -Has chronic history of GI issues, and he would like to avoid any medications that we will cause GI side effects.  Not a candidate for GLP-1 agonist nor metformin - No changes, encouraged low-carb diet and increasing physical  activity   MEDICATIONS:   Continue Basaglar  55 units daily  Continue  Humalog  25- 30 units 3 times daily before every meal Continue Farxiga  10 mg, 1 tablet daily   EDUCATION / INSTRUCTIONS: BG monitoring instructions: Patient is instructed to check his blood sugars 4 times a day, before meals and bedtime Call [facility] if: BG persistently < 70  I reviewed the Rule of 15 for the treatment of hypoglycemia in detail with the patient. Literature supplied.  2) Diabetic complications:  Eye: Does not have known diabetic retinopathy.  Neuro/ Feet: Does have known diabetic peripheral neuropathy. Renal: Patient does not have known baseline CKD. He is on an ACEI/ARB at present.  F/U 6 months    Signed electronically by: Natale Bail, MD  [facility]  [facility] [address] [place], [place] [address] Phone: [phone] FAX: [phone]   CC: Sylvan Evener, MD [address] [place] [address] Phone: [phone]  Fax: [phone]  Return to Endocrinology clinic as below: Future Appointments  Date Time Provider Department Center  11/21/2024 10:00 AM [facility]  11/23/2024 10:00 AM Baxley, Jaynie Meyers, MD [facility]

## 2024-04-26 NOTE — Patient Instructions (Signed)
-   A1c 7.1%  - Continue Basaglar  55 units daily  - Continue Humalog  25-30 units with each meal  - Continue Farxiga  10 mg, 1 tablet daily     HOW TO TREAT LOW BLOOD SUGARS (Blood sugar LESS THAN 70 MG/DL) Please follow the RULE OF 15 for the treatment of hypoglycemia treatment (when your (blood sugars are less than 70 mg/dL)   STEP 1: Take 15 grams of carbohydrates when your blood sugar is low, which includes:  3-4 GLUCOSE TABS  OR 3-4 OZ OF JUICE OR REGULAR SODA OR ONE TUBE OF GLUCOSE GEL    STEP 2: RECHECK blood sugar in 15 MINUTES STEP 3: If your blood sugar is still low at the 15 minute recheck --> then, go back to STEP 1 and treat AGAIN with another 15 grams of carbohydrates.

## 2024-05-11 ENCOUNTER — Other Ambulatory Visit: Payer: Self-pay | Admitting: Internal Medicine

## 2024-05-18 ENCOUNTER — Encounter: Payer: Self-pay | Admitting: Physician Assistant

## 2024-05-30 ENCOUNTER — Other Ambulatory Visit: Payer: Self-pay | Admitting: Cardiovascular Disease

## 2024-06-09 ENCOUNTER — Other Ambulatory Visit: Payer: Self-pay | Admitting: Cardiovascular Disease

## 2024-06-27 ENCOUNTER — Encounter: Payer: Self-pay | Admitting: Cardiovascular Disease

## 2024-06-27 ENCOUNTER — Ambulatory Visit: Attending: Cardiovascular Disease | Admitting: Cardiovascular Disease

## 2024-06-27 VITALS — BP 162/68 | HR 83 | Ht 69.0 in | Wt 158.2 lb

## 2024-06-27 DIAGNOSIS — I350 Nonrheumatic aortic (valve) stenosis: Secondary | ICD-10-CM

## 2024-06-27 DIAGNOSIS — I2510 Atherosclerotic heart disease of native coronary artery without angina pectoris: Secondary | ICD-10-CM | POA: Diagnosis not present

## 2024-06-27 DIAGNOSIS — I739 Peripheral vascular disease, unspecified: Secondary | ICD-10-CM | POA: Diagnosis not present

## 2024-06-27 DIAGNOSIS — I10 Essential (primary) hypertension: Secondary | ICD-10-CM | POA: Diagnosis not present

## 2024-06-27 DIAGNOSIS — E785 Hyperlipidemia, unspecified: Secondary | ICD-10-CM | POA: Diagnosis not present

## 2024-06-27 NOTE — Progress Notes (Signed)
 Cardiology Office Note   Date:  06/27/2024   ID:  Joseph Dixon, DOB [birth year], MRN [id number]  PCP:  Perri Ronal PARAS, MD  Cardiologist:   Deatrice Cage, MD   No chief complaint on file.      History of Present Illness: Joseph Dixon is a 73 y.o. male who is here today  for a follow-up visit regarding peripheral arterial disease and coronary artery disease.  The patient has known history of diabetes mellitus, hypertension and hyperlipidemia.   He is not a smoker and has no family history of premature coronary artery disease. He was seen in 2019 for a slow healing small ulceration on the left big toe. He underwent noninvasive vascular evaluation which showed near normal ABI.  Duplex showed mainly tibial disease with an occluded anterior tibial artery on the right and an occluded anterior tibial and peroneal arteries on the left.  The ulceration healed without need for revascularization. 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   He was suspected of having TIA in September of 2023 with sudden loss of vision in the right eye which lasted for few minutes and was followed by visual disturbances and blurry vision. He was noted to have irregular heartbeat and there has been a concern about possible paroxysmal atrial fibrillation.  He had an echocardiogram done which showed normal LV systolic function with mild aortic stenosis. A 2 weeks ZIO monitor was performed which showed no evidence of atrial fibrillation.  However, he was noted to have short runs of SVT and frequent episodes of nonsustained ventricular tachycardia.  Due to that, he underwent a Lexiscan  Myoview  which showed evidence of prior inferior infarct without significant ischemia.  EF was mildly reduced. 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  Cardiac catheterization was done in June of 2024 which showed heavily calcified coronary  arteries with severe one-vessel coronary artery disease involving a codominant right coronary artery.  Ejection fraction was normal with mildly elevated left ventricular end-diastolic pressure.  I performed successful intravascular lithotripsy and drug-eluting stent placement to the right coronary artery.  He has been doing well with no chest pain, shortness of breath or palpitations.  No lower extremity ulceration or claudication.  He gets his medications mixed up and he does not think he has been taking losartan .  His blood pressure is elevated today.  He is waiting to get refills.   Past Medical History:  Diagnosis Date   Cancer (HCC)    basal cell both shoulders   Diabetes mellitus    insulin  dependant   Hyperlipidemia     Past Surgical History:  Procedure Laterality Date   CORONARY LITHOTRIPSY N/A 05/26/2023   Procedure: CORONARY LITHOTRIPSY;  Surgeon: Cage Deatrice LABOR, MD;  [facility];  Service: Cardiovascular;  Laterality: N/A;   CORONARY STENT INTERVENTION N/A 05/26/2023   Procedure: CORONARY STENT INTERVENTION;  Surgeon: Cage Deatrice LABOR, MD;  [facility];  Service: Cardiovascular;  Laterality: N/A;   LEFT HEART CATH AND CORONARY ANGIOGRAPHY N/A 05/26/2023   Procedure: LEFT HEART CATH AND CORONARY ANGIOGRAPHY;  Surgeon: Cage Deatrice LABOR, MD;  [facility]: [facility];  Service: Cardiovascular;  Laterality: N/A;   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   Proximal right great toe   repair of rt knee laceration       Current Outpatient Medications  Medication Sig Dispense Refill  acetaminophen  (TYLENOL ) 500 MG tablet Take 500 mg by mouth every 6 (six) hours as needed for moderate pain.     aspirin  EC 81 MG tablet Take 1 tablet (81 mg total) by mouth daily. 90 tablet 3   BD PEN NEEDLE NANO 2ND GEN 32G X 4 MM MISC USE 1 PEN NEEDLE DAILY 100 each 11   blood glucose meter kit and supplies KIT Dispense based on patient and insurance  preference. ICD-10: E11.9 1 each 0   clopidogrel  (PLAVIX ) 75 MG tablet TAKE 1 TABLET BY MOUTH EVERY DAY 90 tablet 1   FARXIGA  10 MG TABS tablet TAKE 1 TABLET BY MOUTH DAILY BEFORE BREAKFAST. 30 tablet 6   fluorouracil  (EFUDEX ) 5 % cream Apply 1 Application topically daily.     glucose blood (ONETOUCH VERIO) test strip Check sugar 4 times daily 400 strip 3   hydrochlorothiazide  (HYDRODIURIL ) 25 MG tablet TAKE 1 TABLET BY MOUTH EVERY DAY. NEED OFFICE VISIT 90 tablet 1   Insulin  Glargine (BASAGLAR  KWIKPEN) 100 UNIT/ML Inject 55 Units into the skin daily. (Patient taking differently: Inject 55 Units into the skin at bedtime.) 60 mL 3   insulin  lispro (HUMALOG  KWIKPEN) 200 UNIT/ML KwikPen Max daily 90 units (Patient taking differently: Inject 25-30 Units into the skin with breakfast, with lunch, and with evening meal.) 45 mL 3   losartan  (COZAAR ) 100 MG tablet TAKE 1 TABLET BY MOUTH EVERY DAY 90 tablet 1   nitroGLYCERIN  (NITROSTAT ) 0.4 MG SL tablet Place 1 tablet (0.4 mg total) under the tongue every 5 (five) minutes as needed for chest pain. 25 tablet 2   OneTouch Delica Lancets 33G MISC 1 Device by Does not apply route in the morning, at noon, in the evening, and at bedtime. 400 each 3   ONETOUCH VERIO test strip 1 EACH BY OTHER ROUTE IN THE MORNING, AT NOON, IN THE EVENING, AND AT BEDTIME. USE AS INSTRUCTED 300 strip 5   pantoprazole  (PROTONIX ) 40 MG tablet TAKE 1 TABLET BY MOUTH EVERY DAY 90 tablet 3   rosuvastatin  (CRESTOR ) 40 MG tablet TAKE 1 TABLET BY MOUTH EVERY DAY 90 tablet 2   No current facility-administered medications for this visit.    Allergies:   Sulfa antibiotics    Social History:  The patient  reports that he has quit smoking. He has never used smokeless tobacco. He reports current alcohol use. He reports that he does not use drugs.   Family History:  The patient's family history includes Alzheimer's disease in his father and mother; COPD in his brother; Diabetes in his father  and mother; Heart disease in his father; Mental illness in his father.    ROS:  Please see the history of present illness.   Otherwise, review of systems are positive for none.   All other systems are reviewed and negative.    PHYSICAL EXAM: VS:  BP (!) 162/68   Pulse 83   Ht 5' 9 (1.753 m)   Wt 158 lb 3.2 oz (71.8 kg)   SpO2 98%   BMI 23.36 kg/m  , BMI Body mass index is 23.36 kg/m. GEN: Well nourished, well developed, in no acute distress  HEENT: normal  Neck: no JVD,  or masses.  Bilateral carotid bruits louder on the left side Cardiac: RRR; no rubs, or gallops,no edema .  2 / 6 systolic murmur in the aortic area which is mid peaking. Respiratory:  clear to auscultation bilaterally, normal work of breathing GI: soft, nontender, nondistended, + BS  MS: no deformity or atrophy  Skin: warm and dry, no rash Neuro:  Strength and sensation are intact Psych: euthymic mood, full affect    EKG:  EKG is  ordered today.  EKG showed : Ectopic atrial rhythm Inferior infarct (cited on or before 26-May-2023) ST & T wave abnormality, consider lateral ischemia When compared with ECG of 30-Nov-2023 10:10, T wave inversion now evident in Lateral leads    Recent Labs: 11/08/2023: ALT 19; BUN 17; Creat 0.96; Hemoglobin 16.4; Platelets 198; Potassium 4.4; Sodium 142    Lipid Panel    Component Value Date/Time   CHOL 143 11/08/2023 0949   TRIG 132 11/08/2023 0949   HDL 67 11/08/2023 0949   CHOLHDL 2.1 11/08/2023 0949   VLDL 49 (H) 03/25/2017 1056   LDLCALC 54 11/08/2023 0949      Wt Readings from Last 3 Encounters:  06/27/24 158 lb 3.2 oz (71.8 kg)  04/26/24 163 lb 3.2 oz (74 kg)  11/30/23 163 lb 3.2 oz (74 kg)           No data to display            ASSESSMENT AND PLAN:  1.  Coronary artery disease involving native coronary arteries without angina: Status post  PCI and drug-eluting stent placement to the right coronary artery.  His coronary arteries were noted to  be overall heavily calcified.  No other obstructive disease involving the LAD and left circumflex.   I elected to stop clopidogrel  today.  Continue aspirin  indefinitely.  2.  Peripheral arterial disease: The patient has known tibial disease but currently has no claudication or lower extremity ulceration.  Continue medical therapy  3.  amaurosis fugax: Likely due to small posterior inferior cerebellar artery infarct as confirmed by MRI.  No significant carotid disease.  4.  Aortic stenosis: This was mild on most recent echocardiogram .  His murmur suggests progression of stenosis.  I requested a follow-up echocardiogram to be done in 6 months from now.  5.  Essential hypertension: Blood pressure is elevated but he has not been taking losartan .  I asked him to resume.  6.  Hyperlipidemia: Lipid profile improved after increasing rosuvastatin  with most recent LDL of 54.  7.  Nonsustained ventricular tachycardia: Completely asymptomatic.  Likely due to prior inferior scar.  If blood pressure remains elevated, will consider adding small dose carvedilol.     Disposition:   Follow-up in 6 months.  Signed,  Deatrice Cage, MD  06/27/2024 8:26 AM    [facility]

## 2024-06-27 NOTE — Patient Instructions (Signed)
 Medication Instructions:  Your physician has recommended you make the following change in your medication:  1) STOP taking Plavix  (clopidogrel )  *If you need a refill on your cardiac medications before your next appointment, please call your pharmacy*   Testing/Procedures: Echocardiogram - in 6 months Your physician has requested that you have an echocardiogram. Echocardiography is a painless test that uses sound waves to create images of your heart. It provides your doctor with information about the size and shape of your heart and how well your heart's chambers and valves are working. This procedure takes approximately one hour. There are no restrictions for this procedure. Please do NOT wear cologne, perfume, aftershave, or lotions (deodorant is allowed). Please arrive 15 minutes prior to your appointment time.  Please note: We ask at that you not bring children with you during ultrasound (echo/ vascular) testing. Due to room size and safety concerns, children are not allowed in the ultrasound rooms during exams. Our front office staff cannot provide observation of children in our lobby area while testing is being conducted. An adult accompanying a patient to their appointment will only be allowed in the ultrasound room at the discretion of the ultrasound technician under special circumstances. We apologize for any inconvenience.   Follow-Up: At [facility], you and your health needs are our priority.  As part of our continuing mission to provide you with exceptional heart care, our providers are all part of one team.  This team includes your primary Cardiologist (physician) and Advanced Practice Providers or APPs (Physician Assistants and Nurse Practitioners) who all work together to provide you with the care you need, when you need it.  Your next appointment:   6 months   Provider:   Deatrice Cage, MD

## 2024-07-03 NOTE — Progress Notes (Unsigned)
 07/04/2024 Joseph Dixon [id number] 09/04/1951  Referring provider: Perri Ronal PARAS, MD Primary GI doctor: Dr. Federico (Dr. Aneita)  ASSESSMENT AND PLAN:  Positive fit test 04/05/24 Personal history of TA polyps, no family history of colon cancer 11/08/2023 CBC without anemia normal kidney liver 07/07/2017 colonoscopy for screening, good preparation 7 mm TA polyp, mild diverticulosis sigmoid, internal hemorrhoid recall 5 years Will plan on colonoscopy to evaluate at [facility], We have discussed the risks of bleeding, infection, perforation, medication reactions, and remote risk of death associated with colonoscopy. All questions were answered and the patient acknowledges these risk and wishes to proceed.  Alternating formed to diarrhea 2-3 x in the morning mostly, can have some BRB small volume on TP when this happens No rectal burning, itching Does not happen frequently, likely food or medication related Information given  CAD  status post DES stent RCA 05/26/2023 No CP or SOB  History of CVA 01/20/2023 occluded left posterior cerebral artery  Grade 2 diastolic dysfunction Echo 10/2022 EF 60 to 65% mild aortic stenosis  Patient Care Team: Perri Ronal PARAS, MD as PCP - General (Internal Medicine) Darron Deatrice LABOR, MD as PCP - Cardiology (Cardiology)  HISTORY OF PRESENT ILLNESS: 73 y.o. male with a past medical history listed below presents for evaluation of positive FIT.   Discussed the use of AI scribe software for clinical note transcription with the patient, who gave verbal consent to proceed.  History of Present Illness   The patient, with a history of precancerous polyp removal, presents following a positive FIT test.  She has a history of a colonoscopy in 2018 during which a small tubular adenomatous polyp was removed. At that time, she was also diagnosed with diverticulosis and hemorrhoids. The recall for the colonoscopy was set for five years.  Recently, she had a positive  FIT test and another test that was reported as abnormal. She denies blood in her stools but experiences loose stools at times and firm stools at other times. She typically has multiple bowel movements in the morning, ranging from two to four times. Wiping can cause irritation and soreness in the anal area, sometimes resulting in fresh blood on the tissue. She uses a salve to alleviate the soreness.  No family history of colon cancer. She is not currently on blood thinners, although she mentions having been taken off them previously. No chest pain, shortness of breath, heartburn, trouble swallowing, or abdominal pain. Her stools are sometimes dark brown but not black, and she has not experienced any real hard bowel movements.  She is a widower with children living far away.        He  reports that he has quit smoking. He has never used smokeless tobacco. He reports current alcohol use. He reports that he does not use drugs.  RELEVANT GI HISTORY, IMAGING AND LABS: Results   LABS FIT: Positive  DIAGNOSTIC Colonoscopy (2018): Tubular adenomatous polyp, diverticulosis, hemorrhoids      CBC    Component Value Date/Time   WBC 5.6 11/08/2023 0949   RBC 5.56 11/08/2023 0949   HGB 16.4 11/08/2023 0949   HGB 15.7 05/18/2023 1136   HCT 50.6 (H) 11/08/2023 0949   HCT 47.1 05/18/2023 1136   PLT 198 11/08/2023 0949   PLT 214 05/18/2023 1136   MCV 91.0 11/08/2023 0949   MCV 90 05/18/2023 1136   MCH 29.5 11/08/2023 0949   MCHC 32.4 11/08/2023 0949   RDW 13.5 11/08/2023 0949  RDW 13.7 05/18/2023 1136   LYMPHSABS 1,456 10/05/2022 1103   MONOABS 522 08/21/2016 1412   EOSABS 459 11/08/2023 0949   BASOSABS 101 11/08/2023 0949   Recent Labs    11/08/23 0949  HGB 16.4    CMP     Component Value Date/Time   NA 142 11/08/2023 0949   NA 141 05/18/2023 1136   K 4.4 11/08/2023 0949   CL 103 11/08/2023 0949   CO2 31 11/08/2023 0949   GLUCOSE 158 (H) 11/08/2023 0949   BUN 17 11/08/2023  0949   BUN 18 05/18/2023 1136   CREATININE 0.96 11/08/2023 0949   CALCIUM  9.9 11/08/2023 0949   PROT 7.1 11/08/2023 0949   ALBUMIN 4.2 03/25/2017 1056   AST 18 11/08/2023 0949   ALT 19 11/08/2023 0949   ALKPHOS 62 03/25/2017 1056   BILITOT 0.5 11/08/2023 0949   GFRNONAA 79 07/25/2020 0924   GFRAA 92 07/25/2020 0924      Latest Ref Rng & Units 11/08/2023    9:49 AM 10/05/2022   11:03 AM 07/29/2021    9:26 AM  Hepatic Function  Total Protein 6.1 - 8.1 g/dL 7.1  6.7  6.6   AST 10 - 35 U/L 18  18  16    ALT 9 - 46 U/L 19  19  20    Total Bilirubin 0.2 - 1.2 mg/dL 0.5  0.4  0.4       Current Medications:   Current Outpatient Medications (Endocrine & Metabolic):    FARXIGA  10 MG TABS tablet, TAKE 1 TABLET BY MOUTH DAILY BEFORE BREAKFAST.   Insulin  Glargine (BASAGLAR  KWIKPEN) 100 UNIT/ML, Inject 55 Units into the skin daily. (Patient taking differently: Inject 55 Units into the skin at bedtime.)   insulin  lispro (HUMALOG  KWIKPEN) 200 UNIT/ML KwikPen, Max daily 90 units (Patient taking differently: Inject 25-30 Units into the skin with breakfast, with lunch, and with evening meal.)  Current Outpatient Medications (Cardiovascular):    hydrochlorothiazide  (HYDRODIURIL ) 25 MG tablet, TAKE 1 TABLET BY MOUTH EVERY DAY. NEED OFFICE VISIT   losartan  (COZAAR ) 100 MG tablet, TAKE 1 TABLET BY MOUTH EVERY DAY   nitroGLYCERIN  (NITROSTAT ) 0.4 MG SL tablet, Place 1 tablet (0.4 mg total) under the tongue every 5 (five) minutes as needed for chest pain.   rosuvastatin  (CRESTOR ) 40 MG tablet, TAKE 1 TABLET BY MOUTH EVERY DAY   Current Outpatient Medications (Analgesics):    acetaminophen  (TYLENOL ) 500 MG tablet, Take 500 mg by mouth every 6 (six) hours as needed for moderate pain.   aspirin  EC 81 MG tablet, Take 1 tablet (81 mg total) by mouth daily.   Current Outpatient Medications (Other):    BD PEN NEEDLE NANO 2ND GEN 32G X 4 MM MISC, USE 1 PEN NEEDLE DAILY   blood glucose meter kit and supplies  KIT, Dispense based on patient and insurance preference. ICD-10: E11.9   fluorouracil  (EFUDEX ) 5 % cream, Apply 1 Application topically daily.   glucose blood (ONETOUCH VERIO) test strip, Check sugar 4 times daily   Na Sulfate-K Sulfate-Mg Sulfate concentrate (SUPREP) 17.5-3.13-1.6 GM/177ML SOLN, Take 1 kit (354 mLs total) by mouth once for 1 dose.   OneTouch Delica Lancets 33G MISC, 1 Device by Does not apply route in the morning, at noon, in the evening, and at bedtime.   ONETOUCH VERIO test strip, 1 EACH BY OTHER ROUTE IN THE MORNING, AT NOON, IN THE EVENING, AND AT BEDTIME. 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 Family History:  His family history includes Alzheimer's disease in his father and mother; COPD in his brother; Diabetes in his father and mother; Heart disease in his father; Mental illness in his father.  REVIEW OF SYSTEMS  : All other systems reviewed and negative except where noted in the History of Present Illness.  PHYSICAL EXAM: BP 130/62   Pulse 72   Ht 5' 8 (1.727 m)   Wt 164 lb (74.4 kg)   BMI 24.94 kg/m  Physical Exam   GENERAL APPEARANCE: Well nourished, in no apparent distress. HEENT: No cervical lymphadenopathy, unremarkable thyroid, sclerae anicteric, conjunctiva pink. RESPIRATORY: Respiratory effort normal, breath sounds equal bilaterally without  rales, rhonchi, or wheezing. CARDIO: Regular rate and rhythm with no murmurs, rubs, or gallops, peripheral pulses intact. ABDOMEN: Soft, non-distended, active bowel sounds in all four quadrants, non-tender to palpation, no rebound, no mass appreciated. RECTAL: Declines. MUSCULOSKELETAL: Full range of motion, normal gait, without edema. SKIN: Dry, intact without rashes or lesions. No jaundice. NEURO: Alert, oriented, no focal deficits. PSYCH: Cooperative, normal mood and affect.      Alan JONELLE Coombs, PA-C 3:39 PM

## 2024-07-04 ENCOUNTER — Ambulatory Visit: Admitting: Physician Assistant

## 2024-07-04 ENCOUNTER — Encounter: Payer: Self-pay | Admitting: Physician Assistant

## 2024-07-04 VITALS — BP 130/62 | HR 72 | Ht 68.0 in | Wt 164.0 lb

## 2024-07-04 DIAGNOSIS — Z8673 Personal history of transient ischemic attack (TIA), and cerebral infarction without residual deficits: Secondary | ICD-10-CM

## 2024-07-04 DIAGNOSIS — Z8719 Personal history of other diseases of the digestive system: Secondary | ICD-10-CM

## 2024-07-04 DIAGNOSIS — R195 Other fecal abnormalities: Secondary | ICD-10-CM

## 2024-07-04 DIAGNOSIS — I2510 Atherosclerotic heart disease of native coronary artery without angina pectoris: Secondary | ICD-10-CM

## 2024-07-04 DIAGNOSIS — Z860101 Personal history of adenomatous and serrated colon polyps: Secondary | ICD-10-CM | POA: Diagnosis not present

## 2024-07-04 DIAGNOSIS — R197 Diarrhea, unspecified: Secondary | ICD-10-CM

## 2024-07-04 DIAGNOSIS — Z794 Long term (current) use of insulin: Secondary | ICD-10-CM

## 2024-07-04 MED ORDER — NA SULFATE-K SULFATE-MG SULF 17.5-3.13-1.6 GM/177ML PO SOLN: 1.0000 | Freq: Once | ORAL | 0 refills | Status: AC

## 2024-07-04 NOTE — Patient Instructions (Signed)
 You have been scheduled for a colonoscopy. Please follow written instructions given to you at your visit today.   If you use inhalers (even only as needed), please bring them with you on the day of your procedure.  DO NOT TAKE 7 DAYS PRIOR TO TEST- Trulicity (dulaglutide) Ozempic, Wegovy (semaglutide) Mounjaro (tirzepatide) Bydureon Bcise (exanatide extended release)  DO NOT TAKE 1 DAY PRIOR TO YOUR TEST Rybelsus (semaglutide) Adlyxin (lixisenatide) Victoza (liraglutide) Byetta (exanatide) ___________________________________________________________________________  If your blood pressure at your visit was 140/90 or greater, please contact your primary care physician to follow up on this.  _______________________________________________________  If you are age 50 or older, your body mass index should be between 23-30. Your Body mass index is 24.94 kg/m. If this is out of the aforementioned range listed, please consider follow up with your Primary Care Provider.  If you are age 25 or younger, your body mass index should be between 19-25. Your Body mass index is 24.94 kg/m. If this is out of the aformentioned range listed, please consider follow up with your Primary Care Provider.   ________________________________________________________  The [place] [facility] providers would like to encourage you to use MYCHART to communicate with providers for non-urgent requests or questions.  Due to long hold times on the telephone, sending your provider a message by [facility] may be a faster and more efficient way to get a response.  Please allow 48 business hours for a response.  Please remember that this is for non-urgent requests.  _______________________________________________________

## 2024-07-05 NOTE — Progress Notes (Signed)
 I agree with the assessment and plan as outlined by Ms. Steffanie Dunn.

## 2024-07-10 ENCOUNTER — Encounter: Payer: Self-pay | Admitting: Internal Medicine

## 2024-07-10 ENCOUNTER — Ambulatory Visit (AMBULATORY_SURGERY_CENTER): Admitting: Internal Medicine

## 2024-07-10 VITALS — BP 97/52 | HR 62 | Temp 97.5°F | Resp 13 | Ht 68.0 in | Wt 164.0 lb

## 2024-07-10 DIAGNOSIS — K635 Polyp of colon: Secondary | ICD-10-CM

## 2024-07-10 DIAGNOSIS — D123 Benign neoplasm of transverse colon: Secondary | ICD-10-CM

## 2024-07-10 DIAGNOSIS — I10 Essential (primary) hypertension: Secondary | ICD-10-CM | POA: Diagnosis not present

## 2024-07-10 DIAGNOSIS — E785 Hyperlipidemia, unspecified: Secondary | ICD-10-CM | POA: Diagnosis not present

## 2024-07-10 DIAGNOSIS — R195 Other fecal abnormalities: Secondary | ICD-10-CM

## 2024-07-10 DIAGNOSIS — K648 Other hemorrhoids: Secondary | ICD-10-CM | POA: Diagnosis not present

## 2024-07-10 DIAGNOSIS — K5730 Diverticulosis of large intestine without perforation or abscess without bleeding: Secondary | ICD-10-CM

## 2024-07-10 DIAGNOSIS — D122 Benign neoplasm of ascending colon: Secondary | ICD-10-CM | POA: Diagnosis not present

## 2024-07-10 DIAGNOSIS — Z1211 Encounter for screening for malignant neoplasm of colon: Secondary | ICD-10-CM | POA: Diagnosis not present

## 2024-07-10 DIAGNOSIS — E119 Type 2 diabetes mellitus without complications: Secondary | ICD-10-CM | POA: Diagnosis not present

## 2024-07-10 MED ORDER — DEXTROSE 5 % IV SOLN: INTRAVENOUS | Status: AC

## 2024-07-10 MED ORDER — SODIUM CHLORIDE 0.9 % IV SOLN: 500.0000 mL | INTRAVENOUS | Status: AC

## 2024-07-10 NOTE — Progress Notes (Signed)
 GASTROENTEROLOGY PROCEDURE H&P NOTE   Primary Care Physician: Perri Ronal PARAS, MD    Reason for Procedure:   Positive FIT, history of colon polyps  Plan:    Colonoscopy  Patient is appropriate for endoscopic procedure(s) in the ambulatory ([facility]) setting.  The nature of the procedure, as well as the risks, benefits, and alternatives were carefully and thoroughly reviewed with the patient. Ample time for discussion and questions allowed. The patient understood, was satisfied, and agreed to proceed.     HPI: Joseph Dixon is a 74 y.o. male who presents for colonoscopy for evaluation of positive FIT, history of colon polyps.  Patient was most recently seen in the Gastroenterology Clinic on 07/04/24.  No interval change in medical history since that appointment. Please refer to that note for full details regarding GI history and clinical presentation.   Past Medical History:  Diagnosis Date   Cancer (HCC)    basal cell both shoulders   Diabetes mellitus    insulin  dependant   Hyperlipidemia    Hypertension     Past Surgical History:  Procedure Laterality Date   CORONARY LITHOTRIPSY N/A 05/26/2023   Procedure: CORONARY LITHOTRIPSY;  Surgeon: Darron Deatrice LABOR, MD;  Location: [facility];  Service: Cardiovascular;  Laterality: N/A;   CORONARY STENT INTERVENTION N/A 05/26/2023   Procedure: CORONARY STENT INTERVENTION;  Surgeon: Darron Deatrice LABOR, MD;  Location: [facility] CV [facility];  Service: Cardiovascular;  Laterality: N/A;   LEFT HEART CATH AND CORONARY ANGIOGRAPHY N/A 05/26/2023   Procedure: LEFT HEART CATH AND CORONARY ANGIOGRAPHY;  Surgeon: Darron Deatrice LABOR, MD;  Location: [facility] INVASIVE CV [facility];  Service: Cardiovascular;  Laterality: N/A;   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   Proximal right great toe   repair of rt knee laceration     SKIN CANCER EXCISION      Prior to Admission medications   Medication Sig Start Date End Date Taking? Authorizing Provider   acetaminophen  (TYLENOL ) 500 MG tablet Take 500 mg by mouth every 6 (six) hours as needed for moderate pain.    [provider]  aspirin  EC 81 MG tablet Take 1 tablet (81 mg total) by mouth daily. 05/26/23   Goodrich, Callie E, PA-C  BD PEN NEEDLE NANO 2ND GEN 32G X 4 MM MISC USE 1 PEN NEEDLE DAILY 05/12/24   Shamleffer, Ibtehal Jaralla, MD  blood glucose meter kit and supplies KIT Dispense based on patient and insurance preference. ICD-10: E11.9 03/16/16   Perri Ronal PARAS, MD  FARXIGA  10 MG TABS tablet TAKE 1 TABLET BY MOUTH DAILY BEFORE BREAKFAST. 02/19/23   Shamleffer, Ibtehal Jaralla, MD  fluorouracil  (EFUDEX ) 5 % cream Apply 1 Application topically daily.    [provider]  glucose blood (ONETOUCH VERIO) test strip Check sugar 4 times daily 04/23/22   Shamleffer, Ibtehal Jaralla, MD  hydrochlorothiazide  (HYDRODIURIL ) 25 MG tablet TAKE 1 TABLET BY MOUTH EVERY DAY. NEED OFFICE VISIT 01/26/24   Perri Ronal PARAS, MD  Insulin  Glargine (BASAGLAR  [facility]) 100 UNIT/ML Inject 55 Units into the skin daily. Patient taking differently: Inject 55 Units into the skin at bedtime. 06/18/21   Shamleffer, Ibtehal Jaralla, MD  insulin  lispro (HUMALOG  KWIKPEN) 200 UNIT/ML KwikPen Max daily 90 units Patient taking differently: Inject 25-30 Units into the skin with breakfast, with lunch, and with evening meal. 07/18/20   Shamleffer, Donell Cardinal, MD  losartan  (COZAAR ) 100 MG tablet TAKE 1 TABLET BY MOUTH EVERY DAY 07/18/23   Perri Ronal  J, MD  nitroGLYCERIN  (NITROSTAT ) 0.4 MG SL tablet Place 1 tablet (0.4 mg total) under the tongue every 5 (five) minutes as needed for chest pain. 05/26/23 07/04/24  Goodrich, Callie E, PA-C  OneTouch Delica Lancets 33G MISC 1 Device by Does not apply route in the morning, at noon, in the evening, and at bedtime. 04/23/22   Shamleffer, Ibtehal Jaralla, MD  ONETOUCH VERIO test strip 1 EACH BY OTHER ROUTE IN THE MORNING, AT NOON, IN THE EVENING, AND AT BEDTIME. USE AS INSTRUCTED  12/12/23   Shamleffer, Ibtehal Jaralla, MD  pantoprazole  (PROTONIX ) 40 MG tablet TAKE 1 TABLET BY MOUTH EVERY DAY 08/25/23   Perri Ronal PARAS, MD  rosuvastatin  (CRESTOR ) 40 MG tablet TAKE 1 TABLET BY MOUTH EVERY DAY 05/31/24   Darron Deatrice LABOR, MD    Current Outpatient Medications  Medication Sig Dispense Refill   aspirin  EC 81 MG tablet Take 1 tablet (81 mg total) by mouth daily. 90 tablet 3   BD PEN NEEDLE NANO 2ND GEN 32G X 4 MM MISC USE 1 PEN NEEDLE DAILY 100 each 11   blood glucose meter kit and supplies KIT Dispense based on patient and insurance preference. ICD-10: E11.9 1 each 0   FARXIGA  10 MG TABS tablet TAKE 1 TABLET BY MOUTH DAILY BEFORE BREAKFAST. 30 tablet 6   glucose blood (ONETOUCH VERIO) test strip Check sugar 4 times daily 400 strip 3   hydrochlorothiazide  (HYDRODIURIL ) 25 MG tablet TAKE 1 TABLET BY MOUTH EVERY DAY. NEED OFFICE VISIT 90 tablet 1   Insulin  Glargine (BASAGLAR  KWIKPEN) 100 UNIT/ML Inject 55 Units into the skin daily. (Patient taking differently: Inject 55 Units into the skin at bedtime.) 60 mL 3   insulin  lispro (HUMALOG  KWIKPEN) 200 UNIT/ML KwikPen Max daily 90 units (Patient taking differently: Inject 25-30 Units into the skin with breakfast, with lunch, and with evening meal.) 45 mL 3   losartan  (COZAAR ) 100 MG tablet TAKE 1 TABLET BY MOUTH EVERY DAY 90 tablet 1   OneTouch Delica Lancets 33G MISC 1 Device by Does not apply route in the morning, at noon, in the evening, and at bedtime. 400 each 3   ONETOUCH VERIO test strip 1 EACH BY OTHER ROUTE IN THE MORNING, AT NOON, IN THE EVENING, AND AT BEDTIME. USE AS INSTRUCTED 300 strip 5   rosuvastatin  (CRESTOR ) 40 MG tablet TAKE 1 TABLET BY MOUTH EVERY DAY 90 tablet 2   acetaminophen  (TYLENOL ) 500 MG tablet Take 500 mg by mouth every 6 (six) hours as needed for moderate pain.     fluorouracil  (EFUDEX ) 5 % cream Apply 1 Application topically daily.     nitroGLYCERIN  (NITROSTAT ) 0.4 MG SL tablet Place 1 tablet (0.4 mg  total) under the tongue every 5 (five) minutes as needed for chest pain. 25 tablet 2   pantoprazole  (PROTONIX ) 40 MG tablet TAKE 1 TABLET BY MOUTH EVERY DAY 90 tablet 3   Current Facility-Administered Medications  Medication Dose Route Frequency Provider Last Rate Last Admin   0.9 %  sodium chloride  infusion  500 mL Intravenous Continuous Federico Rosario BROCKS, MD       dextrose  5 % solution   Intravenous Continuous Federico Rosario BROCKS, MD        Allergies as of 07/10/2024 - Review Complete 07/10/2024  Allergen Reaction Noted   Sulfa antibiotics  03/31/2011    Family History  Problem Relation Age of Onset   Diabetes Mother    Alzheimer's disease Mother    Heart disease  Father    Diabetes Father    Mental illness Father    Alzheimer's disease Father    COPD Brother    Colon cancer Neg Hx    Liver disease Neg Hx    Esophageal cancer Neg Hx    Rectal cancer Neg Hx    Stomach cancer Neg Hx     Social History   Socioeconomic History   Marital status: Widowed    Spouse name: Not on file   Number of children: Not on file   Years of education: Not on file   Highest education level: Not on file  Occupational History   Not on file  Tobacco Use   Smoking status: Former   Smokeless tobacco: Never   Tobacco comments:    quit 10 years ago  Substance and Sexual Activity   Alcohol use: Yes    Comment: beer 1 monthly   Drug use: No   Sexual activity: Not on file  Other Topics Concern   Not on file  Social History Narrative   Not on file   Social Drivers of Health   Financial Resource Strain: Low Risk  (11/22/2023)   Overall Financial Resource Strain (CARDIA)    Difficulty of Paying Living Expenses: Not hard at all  Food Insecurity: No Food Insecurity (11/22/2023)   Hunger Vital Sign    Worried About Running Out of Food in the Last Year: Never true    Ran Out of Food in the Last Year: Never true  Transportation Needs: No Transportation Needs (02/17/2023)   PRAPARE - Therapist, art (Medical): No    Lack of Transportation (Non-Medical): No  Physical Activity: Sufficiently Active (11/22/2023)   Exercise Vital Sign    Days of Exercise per Week: 3 days    Minutes of Exercise per Session: 60 min  Stress: No Stress Concern Present (11/22/2023)   [employer] of Occupational Health - Occupational Stress Questionnaire    Feeling of Stress : Not at all  Social Connections: Not on file  Intimate Partner Violence: Not At Risk (11/22/2023)   Humiliation, Afraid, Rape, and Kick questionnaire    Fear of Current or Ex-Partner: No    Emotionally Abused: No    Physically Abused: No    Sexually Abused: No    Physical Exam: Vital signs in last 24 hours: BP (!) 131/59   Pulse 64   Temp (!) 97.5 F (36.4 C)   Ht 5' 8 (1.727 m)   Wt 164 lb (74.4 kg)   SpO2 98%   BMI 24.94 kg/m  GEN: NAD EYE: Sclerae anicteric ENT: MMM CV: Non-tachycardic Pulm: No increased WOB GI: Soft NEURO:  Alert & Oriented   Estefana Kidney, MD [facility]   07/10/2024 2:01 PM

## 2024-07-10 NOTE — Patient Instructions (Signed)
Resume previous diet and medications. Awaiting pathology results. Handouts provided on: Colon polyps, Diverticulosis and Hemorrhoids.  YOU HAD AN ENDOSCOPIC PROCEDURE TODAY AT THE [facility]:   Refer to the procedure report that was given to you for any specific questions about what was found during the examination.  If the procedure report does not answer your questions, please call your gastroenterologist to clarify.  If you requested that your care partner not be given the details of your procedure findings, then the procedure report has been included in a sealed envelope for you to review at your convenience later.  YOU SHOULD EXPECT: Some feelings of bloating in the abdomen. Passage of more gas than usual.  Walking can help get rid of the air that was put into your GI tract during the procedure and reduce the bloating. If you had a lower endoscopy (such as a colonoscopy or flexible sigmoidoscopy) you may notice spotting of blood in your stool or on the toilet paper. If you underwent a bowel prep for your procedure, you may not have a normal bowel movement for a few days.  Please Note:  You might notice some irritation and congestion in your nose or some drainage.  This is from the oxygen used during your procedure.  There is no need for concern and it should clear up in a day or so.  SYMPTOMS TO REPORT IMMEDIATELY:  Following lower endoscopy (colonoscopy or flexible sigmoidoscopy):  Excessive amounts of blood in the stool  Significant tenderness or worsening of abdominal pains  Swelling of the abdomen that is new, acute  Fever of 100F or higher  For urgent or emergent issues, a gastroenterologist can be reached at any hour by calling [phone]. Do not use MyChart messaging for urgent concerns.    DIET:  We do recommend a small meal at first, but then you may proceed to your regular diet.  Drink plenty of fluids but you should avoid alcoholic beverages for 24  hours.  ACTIVITY:  You should plan to take it easy for the rest of today and you should NOT DRIVE or use heavy machinery until tomorrow (because of the sedation medicines used during the test).    FOLLOW UP: Our staff will call the number listed on your records the next business day following your procedure.  We will call around 7:15- 8:00 am to check on you and address any questions or concerns that you may have regarding the information given to you following your procedure. If we do not reach you, we will leave a message.     If any biopsies were taken you will be contacted by phone or by letter within the next 1-3 weeks.  Please call us at [phone] if you have not heard about the biopsies in 3 weeks.    SIGNATURES/CONFIDENTIALITY: You and/or your care partner have signed paperwork which will be entered into your electronic medical record.  These signatures attest to the fact that that the information above on your After Visit Summary has been reviewed and is understood.  Full responsibility of the confidentiality of this discharge information lies with you and/or your care-partner.

## 2024-07-10 NOTE — Op Note (Signed)
 [facility] Patient Name: Joseph Dixon Procedure Date: 07/10/2024 2:02 PM MRN: [id number] Endoscopist: Rosario Estefana Kidney , , [id number] Age: 73 Referring MD:  Date of Birth: 21-May-1951 Gender: Male Account #: [id number] Procedure:                Colonoscopy Indications:              High risk colon cancer surveillance: Personal                            history of colonic polyps, Incidental - Positive                            fecal immunochemical test Medicines:                Monitored Anesthesia Care Procedure:                Pre-Anesthesia Assessment:                           - Prior to the procedure, a History and Physical                            was performed, and patient medications and                            allergies were reviewed. The patient's tolerance of                            previous anesthesia was also reviewed. The risks                            and benefits of the procedure and the sedation                            options and risks were discussed with the patient.                            All questions were answered, and informed consent                            was obtained. Prior Anticoagulants: The patient has                            taken no anticoagulant or antiplatelet agents. ASA                            Grade Assessment: II - A patient with mild systemic                            disease. After reviewing the risks and benefits,                            the patient was deemed in satisfactory condition to  undergo the procedure.                           After obtaining informed consent, the colonoscope                            was passed under direct vision. Throughout the                            procedure, the patient's blood pressure, pulse, and                            oxygen saturations were monitored continuously. The                            Olympus CF-HQ190L ([id number])  Colonoscope was                            introduced through the anus and advanced to the the                            terminal ileum. The colonoscopy was performed                            without difficulty. The patient tolerated the                            procedure well. The quality of the bowel                            preparation was excellent. The terminal ileum,                            ileocecal valve, appendiceal orifice, and rectum                            were photographed. Scope In: 2:15:26 PM Scope Out: 2:36:33 PM Scope Withdrawal Time: 0 hours 16 minutes 53 seconds  Total Procedure Duration: 0 hours 21 minutes 7 seconds  Findings:                 The terminal ileum appeared normal.                           A 10 mm polyp was found in the ascending colon. The                            polyp was sessile. The polyp was removed with a hot                            snare. Resection and retrieval were complete.                           Two sessile polyps were found in the transverse  colon and ascending colon. The polyps were 3 to 6                            mm in size. These polyps were removed with a cold                            snare. Resection and retrieval were complete.                           Multiple diverticula were found in the sigmoid                            colon.                           Non-bleeding internal hemorrhoids were found during                            retroflexion. Complications:            No immediate complications. Estimated Blood Loss:     Estimated blood loss was minimal. Impression:               - The examined portion of the ileum was normal.                           - One 10 mm polyp in the ascending colon, removed                            with a hot snare. Resected and retrieved.                           - Two 3 to 6 mm polyps in the transverse colon and                            in  the ascending colon, removed with a cold snare.                            Resected and retrieved.                           - Diverticulosis in the sigmoid colon.                           - Non-bleeding internal hemorrhoids. Recommendation:           - Discharge patient to home (with escort).                           - Await pathology results.                           - The findings and recommendations were discussed                            with the patient.  Dr Estefana Federico Rosario Estefana Federico,  07/10/2024 2:40:36 PM

## 2024-07-10 NOTE — Progress Notes (Signed)
 To pacu, VSS. Report to Rn.tb

## 2024-07-10 NOTE — Progress Notes (Signed)
VS by DT    

## 2024-07-11 ENCOUNTER — Telehealth: Payer: Self-pay | Admitting: *Deleted

## 2024-07-11 NOTE — Telephone Encounter (Signed)
 Left message on f/u call

## 2024-07-13 ENCOUNTER — Ambulatory Visit: Payer: Self-pay | Admitting: Internal Medicine

## 2024-07-13 LAB — SURGICAL PATHOLOGY

## 2024-07-24 ENCOUNTER — Other Ambulatory Visit: Payer: Self-pay | Admitting: Internal Medicine

## 2024-08-29 DIAGNOSIS — H35033 Hypertensive retinopathy, bilateral: Secondary | ICD-10-CM | POA: Diagnosis not present

## 2024-08-29 DIAGNOSIS — H25813 Combined forms of age-related cataract, bilateral: Secondary | ICD-10-CM | POA: Diagnosis not present

## 2024-08-29 DIAGNOSIS — H35433 Paving stone degeneration of retina, bilateral: Secondary | ICD-10-CM | POA: Diagnosis not present

## 2024-08-29 DIAGNOSIS — H40013 Open angle with borderline findings, low risk, bilateral: Secondary | ICD-10-CM | POA: Diagnosis not present

## 2024-10-24 ENCOUNTER — Ambulatory Visit: Admitting: Internal Medicine

## 2024-10-24 ENCOUNTER — Encounter: Payer: Self-pay | Admitting: Internal Medicine

## 2024-10-24 VITALS — BP 152/64 | HR 58 | Ht 68.0 in | Wt 159.0 lb

## 2024-10-24 DIAGNOSIS — E1142 Type 2 diabetes mellitus with diabetic polyneuropathy: Secondary | ICD-10-CM

## 2024-10-24 DIAGNOSIS — E1159 Type 2 diabetes mellitus with other circulatory complications: Secondary | ICD-10-CM

## 2024-10-24 DIAGNOSIS — Z794 Long term (current) use of insulin: Secondary | ICD-10-CM

## 2024-10-24 LAB — POCT GLYCOSYLATED HEMOGLOBIN (HGB A1C): Hemoglobin A1C: 7.2 % — AB (ref 4.0–5.6)

## 2024-10-24 NOTE — Patient Instructions (Signed)
-   A1c 7.2%  - Continue Basaglar  55 units daily  - Continue Humalog  25-30 units with each meal  - Continue Farxiga  10 mg, 1 tablet daily     HOW TO TREAT LOW BLOOD SUGARS (Blood sugar LESS THAN 70 MG/DL) Please follow the RULE OF 15 for the treatment of hypoglycemia treatment (when your (blood sugars are less than 70 mg/dL)   STEP 1: Take 15 grams of carbohydrates when your blood sugar is low, which includes:  3-4 GLUCOSE TABS  OR 3-4 OZ OF JUICE OR REGULAR SODA OR ONE TUBE OF GLUCOSE GEL    STEP 2: RECHECK blood sugar in 15 MINUTES STEP 3: If your blood sugar is still low at the 15 minute recheck --> then, go back to STEP 1 and treat AGAIN with another 15 grams of carbohydrates.

## 2024-10-24 NOTE — Progress Notes (Signed)
 Name: Joseph Dixon  Age/ Sex: 73 y.o., male   MRN/ DOB: [id number], 31-May-1951     PCP: Perri Ronal PARAS, MD   Reason for Endocrinology Evaluation: Type 2 Diabetes Mellitus  Initial Endocrine Consultative Visit: 11/29/2018    PATIENT IDENTIFIER: Joseph Dixon is a 73 y.o. male with a past medical history of DM, Aortic insufficiency , CAD and hyperlipidemia . The patient has followed with Endocrinology clinic since 11/29/2018 for consultative assistance with management of his diabetes.  DIABETIC HISTORY:  Joseph Dixon was diagnosed with T2DM ~ 2009, he does not recall any anti-glycemic agents he has been on, he has been on insulin  for many years.  His hemoglobin A1c has ranged from 8.0% in 2019, peaking at 12.4% in 2016.   Pt with chronic intermittent Hx of diarrhea. That at times he attributes to Hx  ETOH intake.   Farxiga  started 05/2021  SUBJECTIVE:   During the last visit (04/26/2024): A1c 6.8 %    Today (10/24/2024): Joseph Dixon is here for a 4 month follow up on his diabetes management.  He checks his blood sugars 3 times daily. 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  Patient follows with dermatology for  hx of malignant melanoma, resection 2024  Patient had a follow-up with GI for positive FIT test, s/p colonoscopy 06/2024  No nausea or vomiting  Has occasional changes in bowel movements  Denies nausea, vomiting  Denies constipation but has chronic occasional diarrhea but none recently   Eats 3 meals  a dayy    HOME DIABETES REGIMEN:  Basaglar  55 units daily  Humalog  25-30 units TIDQAC Farxiga  10 mg daily     GLUCOSE LOG: N/a   DIABETIC COMPLICATIONS: Microvascular complications:  Neuropathy , retinopathy Denies: retinopathy, nephropathy  Last eye exam: Completed 02/21/2024   Macrovascular complications:  CAD, PVD, CVA     HISTORY:   Past Medical History:  Past Medical History:  Diagnosis Date   Cancer (HCC)    basal cell both shoulders   Diabetes mellitus    insulin  dependant   Hyperlipidemia    Hypertension    Past Surgical History:  Past Surgical History:  Procedure Laterality Date   CORONARY LITHOTRIPSY N/A 05/26/2023   Procedure: CORONARY LITHOTRIPSY;  Surgeon: Darron Deatrice LABOR, MD;  Location: [facility];  Service: Cardiovascular;  Laterality: N/A;   CORONARY STENT INTERVENTION N/A 05/26/2023   Procedure: CORONARY STENT INTERVENTION;  Surgeon: Darron Deatrice LABOR, MD;  Location: [facility];  Service: Cardiovascular;  Laterality: N/A;   LEFT HEART CATH AND CORONARY ANGIOGRAPHY N/A 05/26/2023   Procedure: LEFT HEART CATH AND CORONARY ANGIOGRAPHY;  Surgeon: Darron Deatrice LABOR, MD;  Location: [facility];  Service: Cardiovascular;  Laterality: N/A;   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   Proximal right great toe   repair of rt knee laceration     SKIN CANCER EXCISION     Social History:  reports that he has quit smoking. He has never used smokeless tobacco. He reports current alcohol use. He reports that he does not use drugs. Family History:  Family History  Problem Relation Age of Onset   Diabetes Mother    Alzheimer's disease Mother    Heart disease Father    Diabetes Father    Mental illness Father    Alzheimer's disease Father  COPD Brother    Colon cancer Neg Hx    Liver disease Neg Hx    Esophageal cancer Neg Hx    Rectal cancer Neg Hx    Stomach cancer Neg Hx      HOME MEDICATIONS: Allergies as of 10/24/2024       Reactions   Sulfa Antibiotics    hallucinations        Medication List        Accurate as of October 24, 2024 10:32 AM. If you have any questions, ask your nurse or doctor.          acetaminophen  500 MG tablet Commonly known as: TYLENOL  Take 500 mg by mouth every 6 (six) hours as needed for moderate pain.   aspirin  EC 81 MG  tablet Take 1 tablet (81 mg total) by mouth daily.   Basaglar  KwikPen 100 UNIT/ML Inject 55 Units into the skin daily. What changed: when to take this   BD Pen Needle Nano 2nd Gen 32G X 4 MM Misc Generic drug: Insulin  Pen Needle USE 1 PEN NEEDLE DAILY   blood glucose meter kit and supplies Kit Dispense based on patient and insurance preference. ICD-10: E11.9   Farxiga  10 MG Tabs tablet Generic drug: dapagliflozin  propanediol TAKE 1 TABLET BY MOUTH DAILY BEFORE BREAKFAST.   fluorouracil  5 % cream Commonly known as: EFUDEX  Apply 1 Application topically daily.   HumaLOG  KwikPen 200 UNIT/ML KwikPen Generic drug: insulin  lispro Max daily 90 units What changed:  how much to take how to take this when to take this additional instructions   hydrochlorothiazide  25 MG tablet Commonly known as: HYDRODIURIL  TAKE 1 TABLET BY MOUTH EVERY DAY. NEED OFFICE VISIT   losartan  100 MG tablet Commonly known as: COZAAR  TAKE 1 TABLET BY MOUTH EVERY DAY   nitroGLYCERIN  0.4 MG SL tablet Commonly known as: Nitrostat  Place 1 tablet (0.4 mg total) under the tongue every 5 (five) minutes as needed for chest pain.   OneTouch Delica Lancets 33G Misc 1 Device by Does not apply route in the morning, at noon, in the evening, and at bedtime.   OneTouch Verio test strip Generic drug: glucose blood Check sugar 4 times daily   OneTouch Verio test strip Generic drug: glucose blood 1 EACH BY OTHER ROUTE IN THE MORNING, AT NOON, IN THE EVENING, AND AT BEDTIME. USE AS INSTRUCTED   pantoprazole  40 MG tablet Commonly known as: PROTONIX  TAKE 1 TABLET BY MOUTH EVERY DAY   rosuvastatin  40 MG tablet Commonly known as: CRESTOR  TAKE 1 TABLET BY MOUTH EVERY DAY        PHYSICAL EXAM: VS: BP (!) 152/64   Pulse (!) 58   Ht 5' 8 (1.727 m)   Wt 159 lb (72.1 kg)   SpO2 98%   BMI 24.18 kg/m    EXAM: General: Pt appears well and is in NAD  Lungs: Clear with good BS bilat   Heart: Auscultation: RRR    Extremities:  BL LE: no pretibial edema    Mental Status: Judgment, insight: intact Orientation: oriented to time, place, and person Mood and affect: no depression, anxiety, or agitation     DM Foot Exam  10/24/2024  The skin of the feet is  without sores or ulcerations ,right proximal amputations of  great toe .  Callus formation around the heels noted The pedal pulses are decreased bilaterally  The sensation is decreased to a screening 5.07, 10 gram monofilament bilaterally  DATA REVIEWED:  Lab Results  Component Value Date   HGBA1C 7.1 (A) 04/26/2024   HGBA1C 7.2 (H) 11/08/2023   HGBA1C 6.8 (A) 10/28/2023    Latest Reference Range & Units 11/08/23 09:49  Sodium 135 - 146 mmol/L 142  Potassium 3.5 - 5.3 mmol/L 4.4  Chloride 98 - 110 mmol/L 103  CO2 20 - 32 mmol/L 31  Glucose 65 - 99 mg/dL 841 (H)  Mean Plasma Glucose mg/dL 839  BUN 7 - 25 mg/dL 17  Creatinine 9.29 - 8.71 mg/dL 9.03  Calcium  8.6 - 10.3 mg/dL 9.9  BUN/Creatinine Ratio 6 - 22 (calc) SEE NOTE:  eGFR > OR = 60 mL/min/1.69m2 85  AG Ratio 1.0 - 2.5 (calc) 1.7  AST 10 - 35 U/L 18  ALT 9 - 46 U/L 19  Total Protein 6.1 - 8.1 g/dL 7.1  Total Bilirubin 0.2 - 1.2 mg/dL 0.5  Total CHOL/HDL Ratio <5.0 (calc) 2.1  Cholesterol <200 mg/dL 856  HDL Cholesterol > OR = 40 mg/dL 67  LDL Cholesterol (Calc) mg/dL (calc) 54  MICROALB/CREAT RATIO <30 mg/g creat 18  Non-HDL Cholesterol (Calc) <130 mg/dL (calc) 76  Triglycerides <150 mg/dL 867    Old records , labs and images have been reviewed.    ASSESSMENT / PLAN / RECOMMENDATIONS:   1) Type 2 Diabetes Mellitus, Optimally controlled, With neuropathic , retinopathic and macrovascular complications - Most recent A1c of 7.2 %. Goal A1c < 7.0 %.    - A1c slightly above goal but overall stable -Patient declines CGM technology -No glucose data today, unable to make insulin  changes -Patient admits to cognitive impairment and forgetting to check glucose or taking  insulin  at times - Pt on assistance for insulin  and Farxiga   -Has chronic history of GI issues, and he would like to avoid any medications that we will cause GI side effects, which includes metformin and GLP-1 agonist - Encouraged low-carb diet and exercise  MEDICATIONS:   Continue Basaglar  55 units daily  Continue  Humalog  25- 30 units 3 times daily before every meal Continue Farxiga  10 mg, 1 tablet daily   EDUCATION / INSTRUCTIONS: BG monitoring instructions: Patient is instructed to check his blood sugars 4 times a day, before meals and bedtime Call [facility] if: BG persistently < 70  I reviewed the Rule of 15 for the treatment of hypoglycemia in detail with the patient. Literature supplied.  2) Diabetic complications:  Eye: Does not have known diabetic retinopathy.  Neuro/ Feet: Does have known diabetic peripheral neuropathy. Renal: Patient does not have known baseline CKD. He is on an ACEI/ARB at present.  F/U 6 months    Signed electronically by: Stefano Redgie Butts, MD  [facility]  [facility] [address] [place], [place] [address] Phone: [phone] FAX: [phone]   CC: Perri Ronal PARAS, MD [address] [place] [address] Phone: [phone]  Fax: [phone]  Return to Endocrinology clinic as below: Future Appointments  Date Time Provider Department Center  11/21/2024 10:00 AM [facility] [address]  11/23/2024 10:00 AM Perri Ronal PARAS, MD [facility] [address]  12/28/2024  9:35 AM [facility]

## 2024-11-02 DIAGNOSIS — H268 Other specified cataract: Secondary | ICD-10-CM | POA: Diagnosis not present

## 2024-11-02 DIAGNOSIS — H25812 Combined forms of age-related cataract, left eye: Secondary | ICD-10-CM | POA: Diagnosis not present

## 2024-11-02 DIAGNOSIS — H2512 Age-related nuclear cataract, left eye: Secondary | ICD-10-CM | POA: Diagnosis not present
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
## 2024-11-13 ENCOUNTER — Telehealth: Payer: Self-pay | Admitting: Internal Medicine

## 2024-11-13 DIAGNOSIS — E1142 Type 2 diabetes mellitus with diabetic polyneuropathy: Secondary | ICD-10-CM

## 2024-11-13 MED ORDER — ACCU-CHEK SOFTCLIX LANCETS MISC: 1.0000 | Freq: Four times a day (QID) | 3 refills | Status: AC

## 2024-11-13 MED ORDER — ACCU-CHEK GUIDE TEST VI STRP: ORAL_STRIP | 12 refills | Status: AC

## 2024-11-13 MED ORDER — ACCU-CHEK GUIDE W/DEVICE KIT: 1.0000 | PACK | Freq: Four times a day (QID) | 0 refills | Status: AC

## 2024-11-13 NOTE — Telephone Encounter (Signed)
 Patient came in to office today with 2 request:  1)  Needs [employer] Application for free medication  2)  States that insurance is requesting him to get a new meter that is on their Administrator) formulary.  The [employer] letter is in Dr. Jacquelyn folder in the front office.  Patient was given the [employer].

## 2024-11-13 NOTE — Telephone Encounter (Signed)
 New order for diabetic testing supplies.  Insurance prefers accu-chek guid/me or trividia true lobbyist.  Order for accuchek sent to pharmacy.

## 2024-11-13 NOTE — Addendum Note (Signed)
 Addended by: Soniyah Mcglory M on: 11/13/2024 03:53 PM   Modules accepted: Orders

## 2024-11-21 ENCOUNTER — Other Ambulatory Visit: Payer: Medicare HMO

## 2024-11-21 ENCOUNTER — Telehealth: Payer: Self-pay | Admitting: Internal Medicine

## 2024-11-21 DIAGNOSIS — Z794 Long term (current) use of insulin: Secondary | ICD-10-CM | POA: Diagnosis not present

## 2024-11-21 DIAGNOSIS — I10 Essential (primary) hypertension: Secondary | ICD-10-CM | POA: Diagnosis not present

## 2024-11-21 DIAGNOSIS — E1169 Type 2 diabetes mellitus with other specified complication: Secondary | ICD-10-CM

## 2024-11-21 DIAGNOSIS — Z125 Encounter for screening for malignant neoplasm of prostate: Secondary | ICD-10-CM | POA: Diagnosis not present

## 2024-11-21 DIAGNOSIS — I739 Peripheral vascular disease, unspecified: Secondary | ICD-10-CM

## 2024-11-21 DIAGNOSIS — E1165 Type 2 diabetes mellitus with hyperglycemia: Secondary | ICD-10-CM | POA: Diagnosis not present

## 2024-11-21 DIAGNOSIS — E785 Hyperlipidemia, unspecified: Secondary | ICD-10-CM | POA: Diagnosis not present

## 2024-11-21 DIAGNOSIS — Z0000 Encounter for general adult medical examination without abnormal findings: Secondary | ICD-10-CM | POA: Diagnosis not present

## 2024-11-21 NOTE — Telephone Encounter (Signed)
 Patient came in to office today and brought a [employer] enrollment form to be completed and a financial statement.  The form and financial statement are in Dr. Kris folder in the front office.

## 2024-11-22 LAB — CBC WITH DIFFERENTIAL/PLATELET
Absolute Lymphocytes: 2041 {cells}/uL (ref 850–3900)
Absolute Monocytes: 729 {cells}/uL (ref 200–950)
Basophils Absolute: 97 {cells}/uL (ref 0–200)
Basophils Relative: 1.2 %
Eosinophils Absolute: 227 {cells}/uL (ref 15–500)
Eosinophils Relative: 2.8 %
HCT: 55.3 % — ABNORMAL HIGH (ref 39.4–51.1)
Hemoglobin: 18.3 g/dL — ABNORMAL HIGH (ref 13.2–17.1)
MCH: 29.1 pg (ref 27.0–33.0)
MCHC: 33.1 g/dL (ref 31.6–35.4)
MCV: 87.9 fL (ref 81.4–101.7)
MPV: 12.1 fL (ref 7.5–12.5)
Monocytes Relative: 9 %
Neutro Abs: 5006 {cells}/uL (ref 1500–7800)
Neutrophils Relative %: 61.8 %
Platelets: 265 Thousand/uL (ref 140–400)
RBC: 6.29 Million/uL — ABNORMAL HIGH (ref 4.20–5.80)
RDW: 13.6 % (ref 11.0–15.0)
Total Lymphocyte: 25.2 %
WBC: 8.1 Thousand/uL (ref 3.8–10.8)

## 2024-11-22 LAB — MICROALBUMIN / CREATININE URINE RATIO
Creatinine, Urine: 166 mg/dL (ref 20–320)
Microalb Creat Ratio: 133 mg/g{creat} — ABNORMAL HIGH (ref <30)
Microalb, Ur: 22.1 mg/dL

## 2024-11-22 LAB — COMPREHENSIVE METABOLIC PANEL WITH GFR
AG Ratio: 1.7 (calc) (ref 1.0–2.5)
ALT: 18 U/L (ref 9–46)
AST: 22 U/L (ref 10–35)
Albumin: 5 g/dL (ref 3.6–5.1)
Alkaline phosphatase (APISO): 72 U/L (ref 35–144)
BUN: 19 mg/dL (ref 7–25)
CO2: 27 mmol/L (ref 20–32)
Calcium: 10.4 mg/dL — ABNORMAL HIGH (ref 8.6–10.3)
Chloride: 97 mmol/L — ABNORMAL LOW (ref 98–110)
Creat: 1.01 mg/dL (ref 0.70–1.28)
Globulin: 3 g/dL (ref 1.9–3.7)
Glucose, Bld: 108 mg/dL — ABNORMAL HIGH (ref 65–99)
Potassium: 4.9 mmol/L (ref 3.5–5.3)
Sodium: 139 mmol/L (ref 135–146)
Total Bilirubin: 0.9 mg/dL (ref 0.2–1.2)
Total Protein: 8 g/dL (ref 6.1–8.1)
eGFR: 80 mL/min/1.73m2 (ref >=60)

## 2024-11-22 LAB — LIPID PANEL
Cholesterol: 171 mg/dL (ref <200)
HDL: 80 mg/dL (ref >=40)
LDL Cholesterol (Calc): 75 mg/dL
Non-HDL Cholesterol (Calc): 91 mg/dL (ref <130)
Total CHOL/HDL Ratio: 2.1 (calc) (ref <5.0)
Triglycerides: 80 mg/dL (ref <150)

## 2024-11-22 LAB — HEMOGLOBIN A1C
Hgb A1c MFr Bld: 7.4 % — ABNORMAL HIGH (ref <5.7)
Mean Plasma Glucose: 166 mg/dL
eAG (mmol/L): 9.2 mmol/L

## 2024-11-22 LAB — PSA: PSA: 0.41 ng/mL (ref <=4.00)

## 2024-11-22 NOTE — Telephone Encounter (Signed)
 Form given to provider

## 2024-11-23 ENCOUNTER — Other Ambulatory Visit: Payer: Self-pay | Admitting: Internal Medicine

## 2024-11-23 ENCOUNTER — Encounter: Payer: Self-pay | Admitting: Internal Medicine

## 2024-11-23 ENCOUNTER — Ambulatory Visit: Payer: Medicare HMO | Admitting: Internal Medicine

## 2024-11-23 VITALS — BP 110/70 | HR 44 | Ht 68.0 in | Wt 157.0 lb

## 2024-11-23 DIAGNOSIS — E119 Type 2 diabetes mellitus without complications: Secondary | ICD-10-CM | POA: Diagnosis not present

## 2024-11-23 DIAGNOSIS — I350 Nonrheumatic aortic (valve) stenosis: Secondary | ICD-10-CM

## 2024-11-23 DIAGNOSIS — Z89419 Acquired absence of unspecified great toe: Secondary | ICD-10-CM

## 2024-11-23 DIAGNOSIS — E785 Hyperlipidemia, unspecified: Secondary | ICD-10-CM

## 2024-11-23 DIAGNOSIS — Z0000 Encounter for general adult medical examination without abnormal findings: Secondary | ICD-10-CM

## 2024-11-23 DIAGNOSIS — H25813 Combined forms of age-related cataract, bilateral: Secondary | ICD-10-CM | POA: Diagnosis not present

## 2024-11-23 DIAGNOSIS — E1169 Type 2 diabetes mellitus with other specified complication: Secondary | ICD-10-CM

## 2024-11-23 DIAGNOSIS — H2511 Age-related nuclear cataract, right eye: Secondary | ICD-10-CM | POA: Diagnosis not present

## 2024-11-23 DIAGNOSIS — Z89411 Acquired absence of right great toe: Secondary | ICD-10-CM

## 2024-11-23 DIAGNOSIS — E1165 Type 2 diabetes mellitus with hyperglycemia: Secondary | ICD-10-CM

## 2024-11-23 DIAGNOSIS — I739 Peripheral vascular disease, unspecified: Secondary | ICD-10-CM

## 2024-11-23 DIAGNOSIS — Z8679 Personal history of other diseases of the circulatory system: Secondary | ICD-10-CM

## 2024-11-23 DIAGNOSIS — Z794 Long term (current) use of insulin: Secondary | ICD-10-CM | POA: Diagnosis not present

## 2024-11-23 DIAGNOSIS — E1143 Type 2 diabetes mellitus with diabetic autonomic (poly)neuropathy: Secondary | ICD-10-CM

## 2024-11-23 DIAGNOSIS — K219 Gastro-esophageal reflux disease without esophagitis: Secondary | ICD-10-CM

## 2024-11-23 DIAGNOSIS — I351 Nonrheumatic aortic (valve) insufficiency: Secondary | ICD-10-CM

## 2024-11-23 DIAGNOSIS — I10 Essential (primary) hypertension: Secondary | ICD-10-CM

## 2024-11-23 DIAGNOSIS — Z955 Presence of coronary angioplasty implant and graft: Secondary | ICD-10-CM

## 2024-11-23 DIAGNOSIS — Z8669 Personal history of other diseases of the nervous system and sense organs: Secondary | ICD-10-CM

## 2024-11-23 DIAGNOSIS — H40009 Preglaucoma, unspecified, unspecified eye: Secondary | ICD-10-CM

## 2024-11-23 NOTE — Patient Instructions (Addendum)
 Mr. Rhett,  It was a pleasure to see you today. Return in one year or as needed. Labs are stable. Continue current medications. Vaccines discussed. Recently had flu and Covid vaccines at pharmacy.  Thank you for taking the time for your Medicare Wellness Visit. I appreciate your continued commitment to your health goals. Please review the care plan we discussed, and feel free to reach out if I can assist you further.  Please note that Annual Wellness Visits do not include a physical exam. Some assessments may be limited, especially if the visit was conducted virtually. If needed, we may recommend an in-person follow-up with your provider.  Ongoing Care Seeing your primary care provider every 3 to 6 months helps us  monitor your health and provide consistent, personalized care.   Referrals If a referral was made during today's visit and you haven't received any updates within two weeks, please contact the referred provider directly to check on the status.  Recommended Screenings:  Health Maintenance  Topic Date Due   Zoster (Shingles) Vaccine (1 of 2) Never done   Medicare Annual Wellness Visit  11/21/2024   Eye exam for diabetics  02/20/2025   COVID-19 Vaccine (5 - 2025-26 season) 04/17/2025   Hemoglobin A1C  05/22/2025   DTaP/Tdap/Td vaccine (3 - Td or Tdap) 09/30/2025   Complete foot exam   10/24/2025   Yearly kidney function blood test for diabetes  11/21/2025   Yearly kidney health urinalysis for diabetes  11/21/2025   Colon Cancer Screening  07/11/2027   Pneumococcal Vaccine for age over 27  Completed   Flu Shot  Completed   Hepatitis C Screening  Completed   Meningitis B Vaccine  Aged Out       11/23/2024    9:48 AM  Advanced Directives  Does Patient Have a Medical Advance Directive? No  Would patient like information on creating a medical advance directive? No - Patient declined    Vision: Annual vision screenings are recommended for early detection of glaucoma,  cataracts, and diabetic retinopathy. These exams can also reveal signs of chronic conditions such as diabetes and high blood pressure.  Dental: Annual dental screenings help detect early signs of oral cancer, gum disease, and other conditions linked to overall health, including heart disease and diabetes.  Please see the attached documents for additional preventive care recommendations.     Next appointment: Follow up in one year for your annual wellness visit.   Preventive Care 47 Years and Older, Male Preventive care refers to lifestyle choices and visits with your health care provider that can promote health and wellness. What does preventive care include? A yearly physical exam. This is also called an annual well check. Dental exams once or twice a year. Routine eye exams. Ask your health care provider how often you should have your eyes checked. Personal lifestyle choices, including: Daily care of your teeth and gums. Regular physical activity. Eating a healthy diet. Avoiding tobacco and drug use. Limiting alcohol use. Practicing safe sex. Taking low doses of aspirin  every day. Taking vitamin and mineral supplements as recommended by your health care provider. What happens during an annual well check? The services and screenings done by your health care provider during your annual well check will depend on your age, overall health, lifestyle risk factors, and family history of disease. Counseling  Your health care provider may ask you questions about your: Alcohol use. Tobacco use. Drug use. Emotional well-being. Home and relationship well-being. Sexual activity. Eating habits.  History of falls. Memory and ability to understand (cognition). Work and work astronomer. Screening  You may have the following tests or measurements: Height, weight, and BMI. Blood pressure. Lipid and cholesterol levels. These may be checked every 5 years, or more frequently if you are over 71  years old. Skin check. Lung cancer screening. You may have this screening every year starting at age 71 if you have a 30-pack-year history of smoking and currently smoke or have quit within the past 15 years. Fecal occult blood test (FOBT) of the stool. You may have this test every year starting at age 71. Flexible sigmoidoscopy or colonoscopy. You may have a sigmoidoscopy every 5 years or a colonoscopy every 10 years starting at age 71. Prostate cancer screening. Recommendations will vary depending on your family history and other risks. Hepatitis C blood test. Hepatitis B blood test. Sexually transmitted disease (STD) testing. Diabetes screening. This is done by checking your blood sugar (glucose) after you have not eaten for a while (fasting). You may have this done every 1-3 years. Abdominal aortic aneurysm (AAA) screening. You may need this if you are a current or former smoker. Osteoporosis. You may be screened starting at age 71 if you are at high risk. Talk with your health care provider about your test results, treatment options, and if necessary, the need for more tests. Vaccines  Your health care provider may recommend certain vaccines, such as: Influenza vaccine. This is recommended every year. Tetanus, diphtheria, and acellular pertussis (Tdap, Td) vaccine. You may need a Td booster every 10 years. Zoster vaccine. You may need this after age 12. Pneumococcal 13-valent conjugate (PCV13) vaccine. One dose is recommended after age 71. Pneumococcal polysaccharide (PPSV23) vaccine. One dose is recommended after age 71. Talk to your health care provider about which screenings and vaccines you need and how often you need them. This information is not intended to replace advice given to you by your health care provider. Make sure you discuss any questions you have with your health care provider. Document Released: 01/03/2016 Document Revised: 08/26/2016 Document Reviewed:  10/08/2015 Elsevier Interactive Patient Education  2017 [employer].  Fall Prevention in the Home Falls can cause injuries. They can happen to people of all ages. There are many things you can do to make your home safe and to help prevent falls. What can I do on the outside of my home? Regularly fix the edges of walkways and driveways and fix any cracks. Remove anything that might make you trip as you walk through a door, such as a raised step or threshold. Trim any bushes or trees on the path to your home. Use bright outdoor lighting. Clear any walking paths of anything that might make someone trip, such as rocks or tools. Regularly check to see if handrails are loose or broken. Make sure that both sides of any steps have handrails. Any raised decks and porches should have guardrails on the edges. Have any leaves, snow, or ice cleared regularly. Use sand or salt on walking paths during winter. Clean up any spills in your garage right away. This includes oil or grease spills. What can I do in the bathroom? Use night lights. Install grab bars by the toilet and in the tub and shower. Do not use towel bars as grab bars. Use non-skid mats or decals in the tub or shower. If you need to sit down in the shower, use a plastic, non-slip stool. Keep the floor dry. Clean up any  water that spills on the floor as soon as it happens. Remove soap buildup in the tub or shower regularly. Attach bath mats securely with double-sided non-slip rug tape. Do not have throw rugs and other things on the floor that can make you trip. What can I do in the bedroom? Use night lights. Make sure that you have a light by your bed that is easy to reach. Do not use any sheets or blankets that are too big for your bed. They should not hang down onto the floor. Have a firm chair that has side arms. You can use this for support while you get dressed. Do not have throw rugs and other things on the floor that can make you  trip. What can I do in the kitchen? Clean up any spills right away. Avoid walking on wet floors. Keep items that you use a lot in easy-to-reach places. If you need to reach something above you, use a strong step stool that has a grab bar. Keep electrical cords out of the way. Do not use floor polish or wax that makes floors slippery. If you must use wax, use non-skid floor wax. Do not have throw rugs and other things on the floor that can make you trip. What can I do with my stairs? Do not leave any items on the stairs. Make sure that there are handrails on both sides of the stairs and use them. Fix handrails that are broken or loose. Make sure that handrails are as long as the stairways. Check any carpeting to make sure that it is firmly attached to the stairs. Fix any carpet that is loose or worn. Avoid having throw rugs at the top or bottom of the stairs. If you do have throw rugs, attach them to the floor with carpet tape. Make sure that you have a light switch at the top of the stairs and the bottom of the stairs. If you do not have them, ask someone to add them for you. What else can I do to help prevent falls? Wear shoes that: Do not have high heels. Have rubber bottoms. Are comfortable and fit you well. Are closed at the toe. Do not wear sandals. If you use a stepladder: Make sure that it is fully opened. Do not climb a closed stepladder. Make sure that both sides of the stepladder are locked into place. Ask someone to hold it for you, if possible. Clearly mark and make sure that you can see: Any grab bars or handrails. First and last steps. Where the edge of each step is. Use tools that help you move around (mobility aids) if they are needed. These include: Canes. Walkers. Scooters. Crutches. Turn on the lights when you go into a dark area. Replace any light bulbs as soon as they burn out. Set up your furniture so you have a clear path. Avoid moving your furniture  around. If any of your floors are uneven, fix them. If there are any pets around you, be aware of where they are. Review your medicines with your doctor. Some medicines can make you feel dizzy. This can increase your chance of falling. Ask your doctor what other things that you can do to help prevent falls. This information is not intended to replace advice given to you by your health care provider. Make sure you discuss any questions you have with your health care provider. Document Released: 10/03/2009 Document Revised: 05/14/2016 Document Reviewed: 01/11/2015 Elsevier Interactive Patient Education  2017 [employer]  Continue current meds. Continue to see Endocrinologist. Follow up here in one year or as needed

## 2024-11-23 NOTE — Telephone Encounter (Signed)
 Gets patient assistance for this

## 2024-11-23 NOTE — Progress Notes (Signed)
 "  Chief Complaint  Patient presents with   Annual Exam   Medicare Wellness     Subjective:   Joseph Dixon is a 73 y.o. male who presents for a Medicare Annual Wellness Visit.  Visit info / Clinical Intake: Medicare Wellness Visit Type:: Subsequent Annual Wellness Visit Persons participating in visit and providing information:: patient Medicare Wellness Visit Mode:: In-person (required for WTM) Interpreter Needed?: No Pre-visit prep was completed: yes AWV questionnaire completed by patient prior to visit?: no Living arrangements:: (!) lives alone Patient's Overall Health Status Rating: good Typical amount of pain: none Does pain affect daily life?: no Are you currently prescribed opioids?: no  Dietary Habits and Nutritional Risks How many meals a day?: 2 Eats fruit and vegetables daily?: (!) no Most meals are obtained by: eating out; preparing own meals In the last 2 weeks, have you had any of the following?: none Diabetic:: (!) yes Any non-healing wounds?: no How often do you check your BS?: 2 Would you like to be referred to a Nutritionist or for Diabetic Management? : no  Functional Status Activities of Daily Living (to include ambulation/medication): Independent Ambulation: Independent Medication Administration: Independent Home Management (perform basic housework or laundry): Independent Manage your own finances?: yes Primary transportation is: driving Concerns about vision?: (!) yes Concerns about hearing?: no  Fall Screening Falls in the past year?: 0 Number of falls in past year: 0 Was there an injury with Fall?: 0 Fall Risk Category Calculator: 0 Patient Fall Risk Level: Low Fall Risk  Fall Risk Patient at Risk for Falls Due to: No Fall Risks Fall risk Follow up: Falls prevention discussed; Education provided; Falls evaluation completed  Home and Transportation Safety: All rugs have non-skid backing?: yes All stairs or steps have railings?: (!)  no Grab bars in the bathtub or shower?: yes Have non-skid surface in bathtub or shower?: (!) no Good home lighting?: yes Regular seat belt use?: yes Hospital stays in the last year:: no  Cognitive Assessment Difficulty concentrating, remembering, or making decisions? : no Will 6CIT or Mini Cog be Completed: yes What year is it?: 0 points What month is it?: 0 points About what time is it?: 0 points Count backwards from 20 to 1: 0 points Say the months of the year in reverse: 0 points Repeat the address phrase from earlier: 0 points 6 CIT Score: 0 points  Advance Directives (For Healthcare) Does Patient Have a Medical Advance Directive?: No Would patient like information on creating a medical advance directive?: No - Patient declined  Reviewed/Updated  Reviewed/Updated: Reviewed All (Medical, Surgical, Family, Medications, Allergies, Care Teams, Patient Goals)    Allergies (verified) Sulfa antibiotics   Current Medications (verified) Outpatient Encounter Medications as of 11/23/2024  Medication Sig   Accu-Chek Softclix Lancets lancets 1 each by Other route 4 (four) times daily. 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   aspirin  EC 81 MG tablet Take 1 tablet (81 mg total) by mouth daily.   BD PEN NEEDLE NANO 2ND GEN 32G X 4 MM MISC USE 1 PEN NEEDLE DAILY   blood glucose meter kit and supplies KIT Dispense based on patient and insurance preference. ICD-10: E11.9   Blood Glucose Monitoring Suppl (ACCU-CHEK GUIDE) w/Device KIT 1 Device by Does not apply route 4 (four) times daily. 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   fluorouracil  (EFUDEX ) 5 % cream Apply 1 Application topically daily.   glucose blood (ACCU-CHEK GUIDE TEST) test strip Use daily up to 4 times.  DX E11.42   hydrochlorothiazide  (HYDRODIURIL ) 25 MG tablet TAKE 1 TABLET BY MOUTH EVERY DAY. NEED  OFFICE VISIT   Insulin  Glargine (BASAGLAR  KWIKPEN) 100 UNIT/ML Inject 55 Units into the skin daily. (Patient taking differently: Inject 55 Units into the skin at bedtime.)   insulin  lispro (HUMALOG  KWIKPEN) 200 UNIT/ML KwikPen Max daily 90 units (Patient taking differently: Inject 25-30 Units into the skin with breakfast, with lunch, and with evening meal.)   losartan  (COZAAR ) 100 MG tablet TAKE 1 TABLET BY MOUTH EVERY DAY   nitroGLYCERIN  (NITROSTAT ) 0.4 MG SL tablet Place 1 tablet (0.4 mg total) under the tongue every 5 (five) minutes as needed for chest pain.   pantoprazole  (PROTONIX ) 40 MG tablet TAKE 1 TABLET BY MOUTH EVERY DAY   rosuvastatin  (CRESTOR ) 40 MG tablet TAKE 1 TABLET BY MOUTH EVERY DAY   [DISCONTINUED] glucose blood (ONETOUCH VERIO) test strip Check sugar 4 times daily   [DISCONTINUED] OneTouch Delica Lancets 33G MISC 1 Device by Does not apply route in the morning, at noon, in the evening, and at bedtime.   [DISCONTINUED] ONETOUCH VERIO test strip 1 EACH BY OTHER ROUTE IN THE MORNING, AT NOON, IN THE EVENING, AND AT BEDTIME. USE AS INSTRUCTED   No facility-administered encounter medications on file as of 11/23/2024.    History: Past Medical History:  Diagnosis Date   Cancer (HCC)    basal cell both shoulders   Diabetes mellitus    insulin  dependant   Hyperlipidemia    Hypertension    Past Surgical History:  Procedure Laterality Date   CORONARY LITHOTRIPSY N/A 05/26/2023   Procedure: CORONARY LITHOTRIPSY;  Surgeon: Darron Deatrice LABOR, MD;  Location: [facility];  Service: Cardiovascular;  Laterality: N/A;   CORONARY STENT INTERVENTION N/A 05/26/2023   Procedure: CORONARY STENT INTERVENTION;  Surgeon: Darron Deatrice LABOR, MD;  Location: [facility];  Service: Cardiovascular;  Laterality: N/A;   LEFT HEART CATH AND CORONARY ANGIOGRAPHY N/A 05/26/2023   Procedure: LEFT HEART CATH AND CORONARY ANGIOGRAPHY;  Surgeon: Darron Deatrice LABOR, MD;  Location: [facility];  Service: Cardiovascular;  Laterality: N/A;   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   Proximal right great toe   repair of rt knee laceration     SKIN CANCER EXCISION     Family History  Problem Relation Age of Onset   Diabetes Mother    Alzheimer's disease Mother    Heart disease Father    Diabetes Father    Mental illness Father    Alzheimer's disease Father    COPD Brother    Colon cancer Neg Hx    Liver disease Neg Hx    Esophageal cancer Neg Hx    Rectal cancer Neg Hx    Stomach cancer Neg Hx    Social History   Occupational History   Not on file  Tobacco Use   Smoking status: Former   Smokeless tobacco: Never   Tobacco comments:    quit 10 years ago  Substance and Sexual Activity   Alcohol use: Yes    Comment: beer 1 monthly   Drug use: No   Sexual activity: Not on file   Tobacco Counseling Counseling given: Not Answered Tobacco comments: quit 10 years ago  SDOH Screenings   Food Insecurity: No Food Insecurity (11/23/2024)  Housing: Low Risk  (11/23/2024)  Transportation Needs: No Transportation Needs (11/23/2024)  Utilities: Not At Risk (11/23/2024)  Alcohol Screen: Low Risk  (11/23/2024)  Depression (PHQ2-9): Low Risk  (11/23/2024)  Financial Resource Strain: Low Risk  (11/23/2024)  Physical Activity: Sufficiently Active (11/23/2024)  Social Connections: Unknown (11/23/2024)  Stress: No Stress Concern Present (11/23/2024)  Tobacco Use: Medium Risk (11/23/2024)  Health Literacy: Adequate Health Literacy (11/23/2024)   See flowsheets for full screening details  Depression Screen PHQ 2 & 9 Depression Scale- Over the past 2 weeks, how often have you been bothered by any of the following problems? Little interest or pleasure in doing things: 0 Feeling down, depressed, or hopeless (PHQ Adolescent also includes...irritable): 0 PHQ-2 Total Score: 0     Goals Addressed   None          Objective:    Today's Vitals   11/23/24 0947  BP: 110/70   Pulse: (!) 44  SpO2: 97%  Weight: 157 lb (71.2 kg)  Height: 5' 8 (1.727 m)  PainSc: 0-No pain   Body mass index is 23.87 kg/m.  Hearing/Vision screen Vision Screening - Comments:: Patient states he is up to date with his annual eye exam last done in March 2025 [place], [facility] Rendering Provider Ophthalmology  Immunizations and Health Maintenance Health Maintenance  Topic Date Due   Zoster Vaccines- Shingrix (1 of 2) Never done   OPHTHALMOLOGY EXAM  02/20/2025   COVID-19 Vaccine (5 - 2025-26 season) 04/17/2025   HEMOGLOBIN A1C  05/22/2025   DTaP/Tdap/Td (3 - Td or Tdap) 09/30/2025   FOOT EXAM  10/24/2025   Diabetic kidney evaluation - eGFR measurement  11/21/2025   Diabetic kidney evaluation - Urine ACR  11/21/2025   Medicare Annual Wellness (AWV)  11/23/2025   Colonoscopy  07/11/2027   Pneumococcal Vaccine: 50+ Years  Completed   Influenza Vaccine  Completed   Hepatitis C Screening  Completed   Meningococcal B Vaccine  Aged Out        Assessment/Plan:  This is a routine wellness examination for Joseph Dixon.  Patient Care Team: Perri Ronal PARAS, MD as PCP - General (Internal Medicine) Darron Deatrice LABOR, MD as PCP - Cardiology (Cardiology)  I have personally reviewed and noted the following in the patients chart:   Medical and social history Use of alcohol, tobacco or illicit drugs  Current medications and supplements including opioid prescriptions. Functional ability and status Nutritional status Physical activity Advanced directives List of other physicians Hospitalizations, surgeries, and ER visits in previous 12 months Vitals Screenings to include cognitive, depression, and falls Referrals and appointments  No orders of the defined types were placed in this encounter.  In addition, I have reviewed and discussed with patient certain preventive protocols, quality metrics, and best practice recommendations. A written personalized care plan for preventive  services as well as general preventive health recommendations were provided to patient.   Jestina Stephani Zelda, CMA   11/23/2024   Return in about 1 year (around 11/23/2025).  After Visit Summary: (In Person-Printed) AVS printed and given to the patient  Nurse Notes: none  I, Ronal PARAS Perri, MD, have reviewed all documentation for this visit. The documentation on 11/23/2024 for the exam, diagnosis, procedures, and orders are all accurate and complete.  "

## 2024-11-23 NOTE — Progress Notes (Deleted)
 Chief Complaint  Patient presents with   Annual Exam   Medicare Wellness     Subjective:   Joseph Dixon is a 73 y.o. male who presents for a Medicare Annual Wellness Visit.  Visit info / Clinical Intake: Medicare Wellness Visit Type:: Subsequent Annual Wellness Visit Persons participating in visit and providing information:: patient Medicare Wellness Visit Mode:: In-person (required for WTM) Interpreter Needed?: No Pre-visit prep was completed: yes AWV questionnaire completed by patient prior to visit?: no Living arrangements:: (!) lives alone Patient's Overall Health Status Rating: good Typical amount of pain: none Does pain affect daily life?: no Are you currently prescribed opioids?: no  Dietary Habits and Nutritional Risks How many meals a day?: 2 Eats fruit and vegetables daily?: (!) no Most meals are obtained by: eating out; preparing own meals In the last 2 weeks, have you had any of the following?: none Diabetic:: (!) yes Any non-healing wounds?: no How often do you check your BS?: 2 Would you like to be referred to a Nutritionist or for Diabetic Management? : no  Functional Status Activities of Daily Living (to include ambulation/medication): Independent Ambulation: Independent Medication Administration: Independent Home Management (perform basic housework or laundry): Independent Manage your own finances?: yes Primary transportation is: driving Concerns about vision?: (!) yes Concerns about hearing?: no  Fall Screening Falls in the past year?: 0 Number of falls in past year: 0 Was there an injury with Fall?: 0 Fall Risk Category Calculator: 0 Patient Fall Risk Level: Low Fall Risk  Fall Risk Patient at Risk for Falls Due to: No Fall Risks Fall risk Follow up: Falls prevention discussed; Education provided; Falls evaluation completed  Home and Transportation Safety: All rugs have non-skid backing?: yes All stairs or steps have railings?: (!)  no Grab bars in the bathtub or shower?: yes Have non-skid surface in bathtub or shower?: (!) no Good home lighting?: yes Regular seat belt use?: yes Hospital stays in the last year:: no  Cognitive Assessment Difficulty concentrating, remembering, or making decisions? : no Will 6CIT or Mini Cog be Completed: yes What year is it?: 0 points What month is it?: 0 points About what time is it?: 0 points Count backwards from 20 to 1: 0 points Say the months of the year in reverse: 0 points Repeat the address phrase from earlier: 0 points 6 CIT Score: 0 points  Advance Directives (For Healthcare) Does Patient Have a Medical Advance Directive?: No Would patient like information on creating a medical advance directive?: No - Patient declined  Reviewed/Updated  Reviewed/Updated: Reviewed All (Medical, Surgical, Family, Medications, Allergies, Care Teams, Patient Goals)    Allergies (verified) Sulfa antibiotics   Current Medications (verified) Outpatient Encounter Medications as of 11/23/2024  Medication Sig   Accu-Chek Softclix Lancets lancets 1 each by Other route 4 (four) times daily. 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   aspirin  EC 81 MG tablet Take 1 tablet (81 mg total) by mouth daily.   BD PEN NEEDLE NANO 2ND GEN 32G X 4 MM MISC USE 1 PEN NEEDLE DAILY   blood glucose meter kit and supplies KIT Dispense based on patient and insurance preference. ICD-10: E11.9   Blood Glucose Monitoring Suppl (ACCU-CHEK GUIDE) w/Device KIT 1 Device by Does not apply route 4 (four) times daily. DX E11.42   FARXIGA  10 MG TABS tablet TAKE 1 TABLET BY MOUTH DAILY BEFORE BREAKFAST.  fluorouracil  (EFUDEX ) 5 % cream Apply 1 Application topically daily.   glucose blood (ACCU-CHEK GUIDE TEST) test strip Use daily up to 4 times.  DX E11.42   hydrochlorothiazide  (HYDRODIURIL ) 25 MG tablet TAKE 1 TABLET BY MOUTH EVERY DAY. NEED  OFFICE VISIT   Insulin  Glargine (BASAGLAR  KWIKPEN) 100 UNIT/ML Inject 55 Units into the skin daily. (Patient taking differently: Inject 55 Units into the skin at bedtime.)   insulin  lispro (HUMALOG  KWIKPEN) 200 UNIT/ML KwikPen Max daily 90 units (Patient taking differently: Inject 25-30 Units into the skin with breakfast, with lunch, and with evening meal.)   losartan  (COZAAR ) 100 MG tablet TAKE 1 TABLET BY MOUTH EVERY DAY   nitroGLYCERIN  (NITROSTAT ) 0.4 MG SL tablet Place 1 tablet (0.4 mg total) under the tongue every 5 (five) minutes as needed for chest pain.   pantoprazole  (PROTONIX ) 40 MG tablet TAKE 1 TABLET BY MOUTH EVERY DAY   rosuvastatin  (CRESTOR ) 40 MG tablet TAKE 1 TABLET BY MOUTH EVERY DAY   [DISCONTINUED] glucose blood (ONETOUCH VERIO) test strip Check sugar 4 times daily   [DISCONTINUED] OneTouch Delica Lancets 33G MISC 1 Device by Does not apply route in the morning, at noon, in the evening, and at bedtime.   [DISCONTINUED] ONETOUCH VERIO test strip 1 EACH BY OTHER ROUTE IN THE MORNING, AT NOON, IN THE EVENING, AND AT BEDTIME. USE AS INSTRUCTED   No facility-administered encounter medications on file as of 11/23/2024.    History: Past Medical History:  Diagnosis Date   Cancer (HCC)    basal cell both shoulders   Diabetes mellitus    insulin  dependant   Hyperlipidemia    Hypertension    Past Surgical History:  Procedure Laterality Date   CORONARY LITHOTRIPSY N/A 05/26/2023   Procedure: CORONARY LITHOTRIPSY;  Surgeon: Darron Deatrice LABOR, MD;  Location: [facility];  Service: Cardiovascular;  Laterality: N/A;   CORONARY STENT INTERVENTION N/A 05/26/2023   Procedure: CORONARY STENT INTERVENTION;  Surgeon: Darron Deatrice LABOR, MD;  Location: [facility];  Service: Cardiovascular;  Laterality: N/A;   LEFT HEART CATH AND CORONARY ANGIOGRAPHY N/A 05/26/2023   Procedure: LEFT HEART CATH AND CORONARY ANGIOGRAPHY;  Surgeon: Darron Deatrice LABOR, MD;  Location: [facility];  Service: Cardiovascular;  Laterality: N/A;   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   Proximal right great toe   repair of rt knee laceration     SKIN CANCER EXCISION     Family History  Problem Relation Age of Onset   Diabetes Mother    Alzheimer's disease Mother    Heart disease Father    Diabetes Father    Mental illness Father    Alzheimer's disease Father    COPD Brother    Colon cancer Neg Hx    Liver disease Neg Hx    Esophageal cancer Neg Hx    Rectal cancer Neg Hx    Stomach cancer Neg Hx    Social History   Occupational History   Not on file  Tobacco Use   Smoking status: Former   Smokeless tobacco: Never   Tobacco comments:    quit 10 years ago  Substance and Sexual Activity   Alcohol use: Yes    Comment: beer 1 monthly   Drug use: No   Sexual activity: Not on file   Tobacco Counseling Counseling given: Not Answered Tobacco comments: quit 10 years ago  SDOH Screenings   Food Insecurity: No Food Insecurity (11/23/2024)  Housing: Low  Risk  (11/23/2024)  Transportation Needs: No Transportation Needs (11/23/2024)  Utilities: Not At Risk (11/23/2024)  Alcohol Screen: Low Risk  (11/23/2024)  Depression (PHQ2-9): Low Risk  (11/23/2024)  Financial Resource Strain: Low Risk  (11/23/2024)  Physical Activity: Sufficiently Active (11/23/2024)  Social Connections: Unknown (11/23/2024)  Stress: No Stress Concern Present (11/23/2024)  Tobacco Use: Medium Risk (11/23/2024)  Health Literacy: Adequate Health Literacy (11/23/2024)   See flowsheets for full screening details  Depression Screen PHQ 2 & 9 Depression Scale- Over the past 2 weeks, how often have you been bothered by any of the following problems? Little interest or pleasure in doing things: 0 Feeling down, depressed, or hopeless (PHQ Adolescent also includes...irritable): 0 PHQ-2 Total Score: 0     Goals Addressed   None          Objective:    Today's Vitals   11/23/24 0947  BP: 110/70   Pulse: (!) 44  SpO2: 97%  Weight: 157 lb (71.2 kg)  Height: 5' 8 (1.727 m)  PainSc: 0-No pain   Body mass index is 23.87 kg/m.  Hearing/Vision screen Vision Screening - Comments:: Patient states he is up to date with his annual eye exam last done in March 2025 [place], [facility] Rendering Provider Ophthalmology  Immunizations and Health Maintenance Health Maintenance  Topic Date Due   Zoster Vaccines- Shingrix (1 of 2) Never done   OPHTHALMOLOGY EXAM  02/20/2025   COVID-19 Vaccine (5 - 2025-26 season) 04/17/2025   HEMOGLOBIN A1C  05/22/2025   DTaP/Tdap/Td (3 - Td or Tdap) 09/30/2025   FOOT EXAM  10/24/2025   Diabetic kidney evaluation - eGFR measurement  11/21/2025   Diabetic kidney evaluation - Urine ACR  11/21/2025   Medicare Annual Wellness (AWV)  11/23/2025   Colonoscopy  07/11/2027   Pneumococcal Vaccine: 50+ Years  Completed   Influenza Vaccine  Completed   Hepatitis C Screening  Completed   Meningococcal B Vaccine  Aged Out        Assessment/Plan:  This is a routine wellness examination for Joseph Dixon.  Patient Care Team: Perri Ronal PARAS, MD as PCP - General (Internal Medicine) Darron Deatrice LABOR, MD as PCP - Cardiology (Cardiology)  I have personally reviewed and noted the following in the patient's chart:   Medical and social history Use of alcohol, tobacco or illicit drugs  Current medications and supplements including opioid prescriptions. Functional ability and status Nutritional status Physical activity Advanced directives List of other physicians Hospitalizations, surgeries, and ER visits in previous 12 months Vitals Screenings to include cognitive, depression, and falls Referrals and appointments  No orders of the defined types were placed in this encounter.  In addition, I have reviewed and discussed with patient certain preventive protocols, quality metrics, and best practice recommendations. A written personalized care plan for preventive  services as well as general preventive health recommendations were provided to patient.   Dickson Kostelnik Zelda, CMA   11/23/2024   Return in about 1 year (around 11/23/2025).  After Visit Summary: (In Person-Printed) AVS printed and given to the patient  Nurse Notes: none

## 2024-11-28 ENCOUNTER — Telehealth: Payer: Self-pay

## 2024-11-28 DIAGNOSIS — D045 Carcinoma in situ of skin of trunk: Secondary | ICD-10-CM | POA: Diagnosis not present

## 2024-11-28 DIAGNOSIS — Z85828 Personal history of other malignant neoplasm of skin: Secondary | ICD-10-CM | POA: Diagnosis not present

## 2024-11-28 DIAGNOSIS — C44519 Basal cell carcinoma of skin of other part of trunk: Secondary | ICD-10-CM | POA: Diagnosis not present

## 2024-11-28 DIAGNOSIS — Z85820 Personal history of malignant melanoma of skin: Secondary | ICD-10-CM | POA: Diagnosis not present

## 2024-11-28 DIAGNOSIS — D485 Neoplasm of uncertain behavior of skin: Secondary | ICD-10-CM | POA: Diagnosis not present

## 2024-11-28 DIAGNOSIS — L821 Other seborrheic keratosis: Secondary | ICD-10-CM | POA: Diagnosis not present

## 2024-11-28 DIAGNOSIS — L570 Actinic keratosis: Secondary | ICD-10-CM | POA: Diagnosis not present

## 2024-11-28 NOTE — Telephone Encounter (Signed)
 Patient came in to office today and signed the application where it needed another signature.   The application is in Dr. Kris folder in the front office.

## 2024-11-28 NOTE — Telephone Encounter (Signed)
 Missing patient signature on page 7 of [facility] [employer] application.  Left message for patient to stop by the office to sign.

## 2024-12-02 ENCOUNTER — Other Ambulatory Visit: Payer: Self-pay | Admitting: Cardiovascular Disease

## 2024-12-10 ENCOUNTER — Other Ambulatory Visit: Payer: Self-pay | Admitting: Internal Medicine

## 2024-12-10 DIAGNOSIS — Z794 Long term (current) use of insulin: Secondary | ICD-10-CM

## 2024-12-11 NOTE — Telephone Encounter (Signed)
 RX addended to reflect most recent notes

## 2024-12-14 ENCOUNTER — Encounter: Payer: Self-pay | Admitting: Internal Medicine

## 2024-12-28 ENCOUNTER — Ambulatory Visit (HOSPITAL_COMMUNITY)
Admission: RE | Admit: 2024-12-28 | Discharge: 2024-12-28 | Disposition: A | Discharge status: Home / Self Care | Source: Ambulatory Visit | Attending: Cardiovascular Disease | Admitting: Cardiovascular Disease

## 2024-12-28 DIAGNOSIS — I2510 Atherosclerotic heart disease of native coronary artery without angina pectoris: Secondary | ICD-10-CM | POA: Insufficient documentation

## 2024-12-28 DIAGNOSIS — I350 Nonrheumatic aortic (valve) stenosis: Secondary | ICD-10-CM | POA: Diagnosis not present

## 2024-12-28 LAB — ECHOCARDIOGRAM COMPLETE
AR max vel: 1.17 cm2
AV Area VTI: 1.02 cm2
AV Area mean vel: 1.06 cm2
AV Mean grad: 20.2 mmHg
AV Peak grad: 32.7 mmHg
Ao pk vel: 2.86 m/s
MV M vel: 4.28 m/s
MV Peak grad: 73.3 mmHg
P 1/2 time: 305 ms
Radius: 0.77 cm
S' Lateral: 4 cm

## 2024-12-29 ENCOUNTER — Ambulatory Visit: Payer: Self-pay | Admitting: Cardiovascular Disease

## 2025-01-09 ENCOUNTER — Ambulatory Visit: Admitting: Cardiovascular Disease

## 2025-01-09 NOTE — Progress Notes (Unsigned)
 "    Cardiology Office Note   Date:  01/09/2025   ID:  Joseph Dixon, DOB 08-Oct-1951, MRN [id number]  PCP:  Perri Ronal PARAS, MD  Cardiologist:   Deatrice Cage, MD   No chief complaint on file.      History of Present Illness: Joseph Dixon is a 74 y.o. male who is here today  for a follow-up visit regarding peripheral arterial disease and coronary artery disease.  The patient has known history of diabetes mellitus, hypertension and hyperlipidemia.   He is not a smoker and has no family history of premature coronary artery disease. He was seen in 2019 for a slow healing small ulceration on the left big toe. He underwent noninvasive vascular evaluation which showed near normal ABI.  Duplex showed mainly tibial disease with an occluded anterior tibial artery on the right and an occluded anterior tibial and peroneal arteries on the left.  The ulceration healed without need for revascularization. 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   He was suspected of having TIA in September of 2023 with sudden loss of vision in the right eye which lasted for few minutes and was followed by visual disturbances and blurry vision. He was noted to have irregular heartbeat and there has been a concern about possible paroxysmal atrial fibrillation.  He had an echocardiogram done which showed normal LV systolic function with mild aortic stenosis. A 2 weeks ZIO monitor was performed which showed no evidence of atrial fibrillation.  However, he was noted to have short runs of SVT and frequent episodes of nonsustained ventricular tachycardia.  Due to that, he underwent a Lexiscan  Myoview  which showed evidence of prior inferior infarct without significant ischemia.  EF was mildly reduced. 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  Cardiac catheterization was done in June of 2024 which showed heavily calcified coronary  arteries with severe one-vessel coronary artery disease involving a codominant right coronary artery.  Ejection fraction was normal with mildly elevated left ventricular end-diastolic pressure.  I performed successful intravascular lithotripsy and drug-eluting stent placement to the right coronary artery.  He has been doing well with no chest pain, shortness of breath or palpitations.  No lower extremity ulceration or claudication.  He gets his medications mixed up and he does not think he has been taking losartan .  His blood pressure is elevated today.  He is waiting to get refills.   Past Medical History:  Diagnosis Date   Cancer (HCC)    basal cell both shoulders   Diabetes mellitus    insulin  dependant   Hyperlipidemia    Hypertension     Past Surgical History:  Procedure Laterality Date   CORONARY LITHOTRIPSY N/A 05/26/2023   Procedure: CORONARY LITHOTRIPSY;  Surgeon: Cage Deatrice LABOR, MD;  Location: [facility];  Service: Cardiovascular;  Laterality: N/A;   CORONARY STENT INTERVENTION N/A 05/26/2023   Procedure: CORONARY STENT INTERVENTION;  Surgeon: Cage Deatrice LABOR, MD;  Location: [facility];  Service: Cardiovascular;  Laterality: N/A;   LEFT HEART CATH AND CORONARY ANGIOGRAPHY N/A 05/26/2023   Procedure: LEFT HEART CATH AND CORONARY ANGIOGRAPHY;  Surgeon: Cage Deatrice LABOR, MD;  Location: [facility];  Service: Cardiovascular;  Laterality: N/A;   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   Proximal right great toe   repair of rt knee laceration     SKIN CANCER EXCISION  Current Outpatient Medications  Medication Sig Dispense Refill   Accu-Chek Softclix Lancets lancets 1 each by Other route 4 (four) times daily. DX E11.42 360 each 3   acetaminophen  (TYLENOL ) 500 MG tablet Take 500 mg by mouth every 6 (six) hours as needed for moderate pain.     aspirin  EC 81 MG tablet Take 1 tablet (81 mg total) by mouth daily. 90 tablet 3   BD PEN NEEDLE NANO  2ND GEN 32G X 4 MM MISC USE 1 PEN NEEDLE DAILY 100 each 11   blood glucose meter kit and supplies KIT Dispense based on patient and insurance preference. ICD-10: E11.9 1 each 0   Blood Glucose Monitoring Suppl (ACCU-CHEK GUIDE) w/Device KIT 1 Device by Does not apply route 4 (four) times daily. DX E11.42 1 kit 0   FARXIGA  10 MG TABS tablet TAKE 1 TABLET BY MOUTH DAILY BEFORE BREAKFAST. 30 tablet 6   fluorouracil  (EFUDEX ) 5 % cream Apply 1 Application topically daily.     glucose blood (ACCU-CHEK GUIDE TEST) test strip Use daily up to 4 times.  DX E11.42 100 each 12   hydrochlorothiazide  (HYDRODIURIL ) 25 MG tablet TAKE 1 TABLET BY MOUTH EVERY DAY. NEED OFFICE VISIT 90 tablet 1   Insulin  Glargine (BASAGLAR  KWIKPEN) 100 UNIT/ML Inject 55 Units into the skin at bedtime. 60 mL 1   insulin  lispro (HUMALOG  KWIKPEN) 200 UNIT/ML KwikPen Max daily 90 units (Patient taking differently: Inject 25-30 Units into the skin with breakfast, with lunch, and with evening meal.) 45 mL 3   losartan  (COZAAR ) 100 MG tablet TAKE 1 TABLET BY MOUTH EVERY DAY 90 tablet 1   nitroGLYCERIN  (NITROSTAT ) 0.4 MG SL tablet Place 1 tablet (0.4 mg total) under the tongue every 5 (five) minutes as needed for chest pain. 25 tablet 2   pantoprazole  (PROTONIX ) 40 MG tablet TAKE 1 TABLET BY MOUTH EVERY DAY 90 tablet 3   rosuvastatin  (CRESTOR ) 40 MG tablet TAKE 1 TABLET BY MOUTH EVERY DAY 90 tablet 2   No current facility-administered medications for this visit.    Allergies:   Sulfa antibiotics    Social History:  The patient  reports that he has quit smoking. He has never used smokeless tobacco. He reports current alcohol use. He reports that he does not use drugs.   Family History:  The patient's family history includes Alzheimer's disease in his father and mother; COPD in his brother; Diabetes in his father and mother; Heart disease in his father; Mental illness in his father.    ROS:  Please see the history of present illness.    Otherwise, review of systems are positive for none.   All other systems are reviewed and negative.    PHYSICAL EXAM: VS:  There were no vitals taken for this visit. , BMI There is no height or weight on file to calculate BMI. GEN: Well nourished, well developed, in no acute distress  HEENT: normal  Neck: no JVD,  or masses.  Bilateral carotid bruits louder on the left side Cardiac: RRR; no rubs, or gallops,no edema .  2 / 6 systolic murmur in the aortic area which is mid peaking. Respiratory:  clear to auscultation bilaterally, normal work of breathing GI: soft, nontender, nondistended, + BS MS: no deformity or atrophy  Skin: warm and dry, no rash Neuro:  Strength and sensation are intact Psych: euthymic mood, full affect    EKG:  EKG is  ordered today.  EKG showed : Ectopic atrial rhythm Inferior  infarct (cited on or before 26-May-2023) ST & T wave abnormality, consider lateral ischemia When compared with ECG of 30-Nov-2023 10:10, T wave inversion now evident in Lateral leads    Recent Labs: 11/21/2024: ALT 18; BUN 19; Creat 1.01; Hemoglobin 18.3; Platelets 265; Potassium 4.9; Sodium 139    Lipid Panel    Component Value Date/Time   CHOL 171 11/21/2024 1016   TRIG 80 11/21/2024 1016   HDL 80 11/21/2024 1016   CHOLHDL 2.1 11/21/2024 1016   VLDL 49 (H) 03/25/2017 1056   LDLCALC 75 11/21/2024 1016      Wt Readings from Last 3 Encounters:  11/23/24 157 lb (71.2 kg)  10/24/24 159 lb (72.1 kg)  07/10/24 164 lb (74.4 kg)           No data to display            ASSESSMENT AND PLAN:  1.  Coronary artery disease involving native coronary arteries without angina: Status post  PCI and drug-eluting stent placement to the right coronary artery.  His coronary arteries were noted to be overall heavily calcified.  No other obstructive disease involving the LAD and left circumflex.   I elected to stop clopidogrel  today.  Continue aspirin  indefinitely.  2.  Peripheral  arterial disease: The patient has known tibial disease but currently has no claudication or lower extremity ulceration.  Continue medical therapy  3.  amaurosis fugax: Likely due to small posterior inferior cerebellar artery infarct as confirmed by MRI.  No significant carotid disease.  4.  Aortic stenosis: This was mild on most recent echocardiogram .  His murmur suggests progression of stenosis.  I requested a follow-up echocardiogram to be done in 6 months from now.  5.  Essential hypertension: Blood pressure is elevated but he has not been taking losartan .  I asked him to resume.  6.  Hyperlipidemia: Lipid profile improved after increasing rosuvastatin  with most recent LDL of 54.  7.  Nonsustained ventricular tachycardia: Completely asymptomatic.  Likely due to prior inferior scar.  If blood pressure remains elevated, will consider adding small dose carvedilol.     Disposition:   Follow-up in 6 months.  Signed,  Deatrice Cage, MD  01/09/2025 8:17 AM    [facility] "

## 2025-01-12 ENCOUNTER — Encounter: Payer: Self-pay | Admitting: Internal Medicine

## 2025-01-12 ENCOUNTER — Telehealth: Payer: Self-pay | Admitting: Internal Medicine

## 2025-01-12 NOTE — Telephone Encounter (Signed)
 Received call from answering service that Carin Hoyle from EMS needed to speak with me about patient. I returned the call and was  told  that patient had been found deceased in his home. They were called to check on patient. Apparently last time he was heard from was about 2 days ago.   Patient had hx of DM and was being seen by Cardiology for CAD, aortic stenosis and mitral regurgitation.LVEF noted to be 30-35% with moderate decrease LV function. Akinesis of  inferior and posterior walls. Moderate dilatation of left atrium and right atrium. Moderate MR. Aortic valve was tricuspid Severe calcification of aortic valve. Severe thickening of aortic valve. Moderate AR. Aortic valve mean gradient 20.2 mmHg. Global hypokinesis of LV with estimated 30-35%.  Agreed to sign death certificate as requested. Funeral Home is [employer].  Contact is daughter Buck Mcaffee at [phone].  Will forward this note to Dr. Lindi.  Ronal Norleen Hailstone, MD

## 2025-01-16 NOTE — Telephone Encounter (Signed)
 Thanks for letting me know. This is very unfortunate.  He was scheduled to see me on Tuesday of last week but he missed his appointment.

## 2025-01-21 DEATH — deceased

## 2025-01-25 NOTE — Progress Notes (Signed)
 Joseph Dixon                                          MRN: [id number]   01/25/2025   The [facility] Quality Team Specialist reviewed this patient medical record for the purposes of chart review for care gap closure. The following were reviewed: abstraction for care gap closure-kidney health evaluation for diabetes:eGFR  and uACR.    [facility] Quality Team

## 2025-04-24 ENCOUNTER — Ambulatory Visit: Admitting: Internal Medicine

## 2025-11-27 ENCOUNTER — Other Ambulatory Visit

## 2025-11-29 ENCOUNTER — Ambulatory Visit: Admitting: Internal Medicine
